# Patient Record
Sex: Female | Born: 1970 | Hispanic: No | Marital: Married | Smoking: Current every day smoker
Health system: Southern US, Community
[De-identification: ages and names within clinical notes are randomized; demographics above are authoritative.]

## PROBLEM LIST (undated history)

## (undated) DIAGNOSIS — N63 Unspecified lump in unspecified breast: Secondary | ICD-10-CM

## (undated) DIAGNOSIS — F419 Anxiety disorder, unspecified: Secondary | ICD-10-CM

## (undated) DIAGNOSIS — R058 Other specified cough: Secondary | ICD-10-CM

## (undated) DIAGNOSIS — F32A Depression, unspecified: Secondary | ICD-10-CM

## (undated) DIAGNOSIS — R05 Cough: Secondary | ICD-10-CM

## (undated) DIAGNOSIS — T8859XA Other complications of anesthesia, initial encounter: Secondary | ICD-10-CM

## (undated) DIAGNOSIS — G4763 Sleep related bruxism: Secondary | ICD-10-CM

## (undated) DIAGNOSIS — F329 Major depressive disorder, single episode, unspecified: Secondary | ICD-10-CM

## (undated) DIAGNOSIS — T4145XA Adverse effect of unspecified anesthetic, initial encounter: Secondary | ICD-10-CM

## (undated) HISTORY — DX: Unspecified lump in unspecified breast: N63.0

## (undated) HISTORY — PX: UMBILICAL HERNIA REPAIR: SHX196

## (undated) HISTORY — DX: Anxiety disorder, unspecified: F41.9

---

## 1997-09-28 HISTORY — PX: OTHER SURGICAL HISTORY: SHX169

## 1998-05-28 ENCOUNTER — Encounter: Admission: RE | Admit: 1998-05-28 | Discharge: 1998-05-28 | Payer: Self-pay | Admitting: *Deleted

## 2003-09-29 HISTORY — PX: CERVIX SURGERY: SHX593

## 2004-07-10 ENCOUNTER — Encounter: Admission: RE | Admit: 2004-07-10 | Discharge: 2004-07-10 | Payer: Self-pay | Admitting: Internal Medicine

## 2006-05-13 ENCOUNTER — Emergency Department (HOSPITAL_COMMUNITY): Admission: EM | Admit: 2006-05-13 | Discharge: 2006-05-13 | Payer: Self-pay | Admitting: Emergency Medicine

## 2010-01-09 ENCOUNTER — Encounter: Admission: RE | Admit: 2010-01-09 | Discharge: 2010-03-26 | Payer: Self-pay | Admitting: Emergency Medicine

## 2011-08-27 ENCOUNTER — Other Ambulatory Visit: Payer: Self-pay | Admitting: Obstetrics and Gynecology

## 2011-08-27 DIAGNOSIS — N6320 Unspecified lump in the left breast, unspecified quadrant: Secondary | ICD-10-CM

## 2011-09-10 ENCOUNTER — Ambulatory Visit
Admission: RE | Admit: 2011-09-10 | Discharge: 2011-09-10 | Disposition: A | Discharge status: Home / Self Care | Payer: BC Managed Care – PPO | Source: Ambulatory Visit | Attending: Obstetrics and Gynecology | Admitting: Obstetrics and Gynecology

## 2011-09-10 DIAGNOSIS — N6320 Unspecified lump in the left breast, unspecified quadrant: Secondary | ICD-10-CM

## 2011-10-26 ENCOUNTER — Ambulatory Visit (INDEPENDENT_AMBULATORY_CARE_PROVIDER_SITE_OTHER): Payer: BC Managed Care – PPO

## 2011-10-26 DIAGNOSIS — J040 Acute laryngitis: Secondary | ICD-10-CM

## 2011-10-26 DIAGNOSIS — J029 Acute pharyngitis, unspecified: Secondary | ICD-10-CM

## 2011-10-26 DIAGNOSIS — J209 Acute bronchitis, unspecified: Secondary | ICD-10-CM

## 2011-10-27 ENCOUNTER — Telehealth: Payer: Self-pay

## 2011-10-27 NOTE — Telephone Encounter (Signed)
.  [facility]  Pt saw dr Mindi Junker yesterday,told to call back if she wanted 2nd rx, please call into [facility]

## 2011-10-28 NOTE — Telephone Encounter (Signed)
LMOM notifying pt that Diflucan was called into [facility].

## 2012-02-01 ENCOUNTER — Ambulatory Visit (INDEPENDENT_AMBULATORY_CARE_PROVIDER_SITE_OTHER): Payer: BC Managed Care – PPO | Admitting: General Surgery

## 2012-02-01 ENCOUNTER — Encounter (INDEPENDENT_AMBULATORY_CARE_PROVIDER_SITE_OTHER): Payer: Self-pay | Admitting: General Surgery

## 2012-02-01 DIAGNOSIS — N63 Unspecified lump in unspecified breast: Secondary | ICD-10-CM

## 2012-02-01 NOTE — Progress Notes (Signed)
Patient ID: Amanda [name], female   DOB: [birth year], 41 y.o.   MRN: [id number]  Chief Complaint  Patient presents with  . Breast Mass    new pt- eval L breast mass    HPI Amanda [name] is a 41 y.o. female. Referred by Dr. Dareen Piano HPI  30 yof who has history of fibrocystic changes and difficult exam who presents with about a six month history of left uoq breast mass.  This underwent mmg/us in December which was normal.  The area got bigger after attempt at biopsy and has remained the same.  She is concerned this area is not going away.  She has no nipple d/c, no prior history of breast biopsies or abnl mmg.  She does have fh in mom who had breast cancer at age 5.    Past Medical History  Diagnosis Date  . Breast mass     left breast    Past Surgical History  Procedure Date  . Hernia repair 1984    umb hernia  . Vocal cord polyp excision 1992  . Cervix surgery     Family History  Problem Relation Age of Onset  . Cancer Mother     breast  . Cancer Maternal Uncle     brain  . Cancer Maternal Grandmother     leukemia    Social History History  Substance Use Topics  . Smoking status: Current Everyday Smoker  . Smokeless tobacco: Not on file   Comment: smokes 1 cigarette a day  . Alcohol Use: No    No Known Allergies  Current Outpatient Prescriptions  Medication Sig Dispense Refill  . FLUoxetine (PROZAC) 20 MG capsule       . Multiple Vitamin (MULTIVITAMINS PO) Take by mouth. Multivitamin + skin        Review of Systems Review of Systems  Constitutional: Negative for fever, chills and unexpected weight change.  HENT: Negative for hearing loss, congestion, sore throat, trouble swallowing and voice change.   Eyes: Negative for visual disturbance.  Respiratory: Negative for cough and wheezing.   Cardiovascular: Negative for chest pain, palpitations and leg swelling.  Gastrointestinal: Negative for nausea, vomiting, abdominal pain, diarrhea, constipation, blood in  stool, abdominal distention and anal bleeding.  Genitourinary: Negative for hematuria, vaginal bleeding and difficulty urinating.  Musculoskeletal: Negative for arthralgias.  Skin: Negative for rash and wound.  Neurological: Negative for seizures, syncope and headaches.  Hematological: Negative for adenopathy. Does not bruise/bleed easily.  Psychiatric/Behavioral: Negative for confusion.    There were no vitals taken for this visit.  Physical Exam Physical Exam  Vitals reviewed. Constitutional: She appears well-developed and well-nourished.  Neck: Neck supple.  Cardiovascular: Normal rate, regular rhythm and normal heart sounds.   Pulmonary/Chest: Effort normal and breath sounds normal. She has no wheezes. She has no rales. Right breast exhibits no inverted nipple, no mass, no nipple discharge, no skin change and no tenderness. Left breast exhibits mass. Left breast exhibits no inverted nipple, no nipple discharge, no skin change and no tenderness. Breasts are symmetrical.    Lymphadenopathy:    She has no cervical adenopathy.    Data Reviewed Clinical Data: Questioned palpable finding left upper outer  quadrant per patient and referring physician. Maternal history of  breast cancer age 32.  DIGITAL DIAGNOSTIC BILATERAL MAMMOGRAM WITH CAD AND LEFT BREAST  ULTRASOUND:  Comparison: The patient's prior examinations from [place] are not  available for comparison.  Findings: The breast parenchyma is extremely dense,  which may  limit the sensitivity of mammography. No suspicious mass,  calcification, or architectural distortion is seen.  Mammographic images were processed with CAD.  On physical exam, I palpate no abnormality in the left upper outer  quadrant. There is a ridge of dense tissue in this area.  Ultrasound is performed, showing dense fibroglandular tissue but no  measurable focal abnormality, distortion, or shadowing in the left  upper outer quadrant.  IMPRESSION:  No  evidence for malignancy in either breast. Any further  management of the questioned palpable finding in the left upper  outer quadrant should be dictated by clinical assessment.  Otherwise, screening mammography is recommended in 1 year. Findings  and recommendations discussed with the patient and provided in  written form at the time of the exam.   Assessment    Left breast mass    Plan    Both of [place] can easily palpate this mass.  I recommended excisional biopsy and we discussed that.  She is unable to do this before she takes a 2 month trip home so [place].  We will plan on doing this when she returns.  I told her we were doing it to rule out cancer.       Amanda [name] 02/01/2012, 9:46 AM

## 2012-04-28 DIAGNOSIS — N63 Unspecified lump in unspecified breast: Secondary | ICD-10-CM

## 2012-04-28 HISTORY — DX: Unspecified lump in unspecified breast: N63.0

## 2012-05-13 ENCOUNTER — Telehealth (INDEPENDENT_AMBULATORY_CARE_PROVIDER_SITE_OTHER): Payer: Self-pay

## 2012-05-13 NOTE — Telephone Encounter (Signed)
LMOM giving pt a f/u appt with Dr Dwain Sarna on 8/26 before her sx on 8/29. I asked for her to come in at 4:15.

## 2012-05-18 ENCOUNTER — Other Ambulatory Visit (INDEPENDENT_AMBULATORY_CARE_PROVIDER_SITE_OTHER): Payer: Self-pay | Admitting: General Surgery

## 2012-05-19 ENCOUNTER — Encounter (HOSPITAL_BASED_OUTPATIENT_CLINIC_OR_DEPARTMENT_OTHER): Payer: Self-pay | Admitting: *Deleted

## 2012-05-23 ENCOUNTER — Encounter (INDEPENDENT_AMBULATORY_CARE_PROVIDER_SITE_OTHER): Payer: Self-pay | Admitting: General Surgery

## 2012-05-23 ENCOUNTER — Ambulatory Visit (INDEPENDENT_AMBULATORY_CARE_PROVIDER_SITE_OTHER): Payer: BC Managed Care – PPO | Admitting: General Surgery

## 2012-05-23 DIAGNOSIS — N63 Unspecified lump in unspecified breast: Secondary | ICD-10-CM

## 2012-05-23 NOTE — Progress Notes (Signed)
Subjective:     Patient ID: Amanda [name], female   DOB: 1970-10-27, 41 y.o.   MRN: [id number]  HPI This is a 41 year old female I saw previously with a left upper-outer quadrant or left axillary mass. She was in obvious pain when I had seen her last. She returns now without any real changes associated with this.  Review of Systems     Objective:   Physical Exam  Pulmonary/Chest:         Assessment:     Left breast or axillary mass    Plan:     We discussed again today that this area needs to be excised. Discussed again the risks of this also., Plan on doing this on Thursday now she has returned.

## 2012-05-26 ENCOUNTER — Encounter (HOSPITAL_BASED_OUTPATIENT_CLINIC_OR_DEPARTMENT_OTHER)
Admission: RE | Disposition: A | Discharge status: Home / Self Care | Payer: Self-pay | Source: Ambulatory Visit | Attending: General Surgery

## 2012-05-26 ENCOUNTER — Encounter (HOSPITAL_BASED_OUTPATIENT_CLINIC_OR_DEPARTMENT_OTHER): Payer: Self-pay | Admitting: *Deleted

## 2012-05-26 ENCOUNTER — Ambulatory Visit (HOSPITAL_BASED_OUTPATIENT_CLINIC_OR_DEPARTMENT_OTHER): Payer: BC Managed Care – PPO | Admitting: Certified Registered"

## 2012-05-26 ENCOUNTER — Encounter (HOSPITAL_BASED_OUTPATIENT_CLINIC_OR_DEPARTMENT_OTHER): Payer: Self-pay | Admitting: Certified Registered"

## 2012-05-26 ENCOUNTER — Ambulatory Visit (HOSPITAL_BASED_OUTPATIENT_CLINIC_OR_DEPARTMENT_OTHER)
Admission: RE | Admit: 2012-05-26 | Discharge: 2012-05-26 | Disposition: A | Discharge status: Home / Self Care | Payer: BC Managed Care – PPO | Source: Ambulatory Visit | Attending: General Surgery | Admitting: General Surgery

## 2012-05-26 ENCOUNTER — Telehealth (INDEPENDENT_AMBULATORY_CARE_PROVIDER_SITE_OTHER): Payer: Self-pay

## 2012-05-26 DIAGNOSIS — D249 Benign neoplasm of unspecified breast: Secondary | ICD-10-CM | POA: Insufficient documentation

## 2012-05-26 HISTORY — DX: Other specified cough: R05.8

## 2012-05-26 HISTORY — DX: Cough: R05

## 2012-05-26 HISTORY — DX: Major depressive disorder, single episode, unspecified: F32.9

## 2012-05-26 HISTORY — DX: Sleep related bruxism: G47.63

## 2012-05-26 HISTORY — PX: BREAST MASS EXCISION: SHX1267

## 2012-05-26 HISTORY — DX: Depression, unspecified: F32.A

## 2012-05-26 HISTORY — DX: Other complications of anesthesia, initial encounter: T88.59XA

## 2012-05-26 HISTORY — DX: Adverse effect of unspecified anesthetic, initial encounter: T41.45XA

## 2012-05-26 SURGERY — EXCISION OF BREAST BIOPSY
Anesthesia: General | Site: Breast | Laterality: Left | Wound class: Clean

## 2012-05-26 MED ORDER — OXYCODONE HCL 5 MG PO TABS: 5.0000 mg | ORAL_TABLET | Freq: Once | ORAL | Status: DC | PRN

## 2012-05-26 MED ORDER — METOCLOPRAMIDE HCL 5 MG/ML IJ SOLN: 10.0000 mg | Freq: Once | INTRAMUSCULAR | Status: DC | PRN

## 2012-05-26 MED ORDER — ONDANSETRON HCL 4 MG/2ML IJ SOLN
INTRAMUSCULAR | Status: DC | PRN
  Administered 2012-05-26: 4 mg via INTRAVENOUS

## 2012-05-26 MED ORDER — HYDROMORPHONE HCL PF 1 MG/ML IJ SOLN: 0.2500 mg | INTRAMUSCULAR | Status: DC | PRN

## 2012-05-26 MED ORDER — LACTATED RINGERS IV SOLN
INTRAVENOUS | Status: DC
  Administered 2012-05-26: 07:00:00 via INTRAVENOUS

## 2012-05-26 MED ORDER — FENTANYL CITRATE 0.05 MG/ML IJ SOLN: 50.0000 ug | INTRAMUSCULAR | Status: DC | PRN

## 2012-05-26 MED ORDER — FENTANYL CITRATE 0.05 MG/ML IJ SOLN
INTRAMUSCULAR | Status: DC | PRN
  Administered 2012-05-26: 50 ug via INTRAVENOUS

## 2012-05-26 MED ORDER — OXYCODONE-ACETAMINOPHEN 5-325 MG PO TABS: 1.0000 | ORAL_TABLET | ORAL | Status: DC | PRN

## 2012-05-26 MED ORDER — LIDOCAINE HCL (CARDIAC) 20 MG/ML IV SOLN
INTRAVENOUS | Status: DC | PRN
  Administered 2012-05-26: 40 mg via INTRAVENOUS

## 2012-05-26 MED ORDER — ACETAMINOPHEN 10 MG/ML IV SOLN
1000.0000 mg | Freq: Once | INTRAVENOUS | Status: AC
  Administered 2012-05-26: 1000 mg via INTRAVENOUS

## 2012-05-26 MED ORDER — OXYCODONE HCL 5 MG/5ML PO SOLN: 5.0000 mg | Freq: Once | ORAL | Status: DC | PRN

## 2012-05-26 MED ORDER — SCOPOLAMINE 1 MG/3DAYS TD PT72
1.0000 | MEDICATED_PATCH | Freq: Once | TRANSDERMAL | Status: DC
  Administered 2012-05-26: 1.5 mg via TRANSDERMAL

## 2012-05-26 MED ORDER — DEXAMETHASONE SODIUM PHOSPHATE 4 MG/ML IJ SOLN
INTRAMUSCULAR | Status: DC | PRN
  Administered 2012-05-26: 10 mg via INTRAVENOUS

## 2012-05-26 MED ORDER — PROPOFOL 10 MG/ML IV BOLUS
INTRAVENOUS | Status: DC | PRN
  Administered 2012-05-26: 40 mg via INTRAVENOUS
  Administered 2012-05-26: 200 mg via INTRAVENOUS

## 2012-05-26 MED ORDER — BUPIVACAINE HCL (PF) 0.25 % IJ SOLN
INTRAMUSCULAR | Status: DC | PRN
  Administered 2012-05-26: 10 mL

## 2012-05-26 MED ORDER — MIDAZOLAM HCL 5 MG/5ML IJ SOLN
INTRAMUSCULAR | Status: DC | PRN
  Administered 2012-05-26: 1 mg via INTRAVENOUS

## 2012-05-26 SURGICAL SUPPLY — 55 items
ADH SKN CLS APL DERMABOND .7 (GAUZE/BANDAGES/DRESSINGS) ×1
APL SKNCLS STERI-STRIP NONHPOA (GAUZE/BANDAGES/DRESSINGS)
APPLIER CLIP 9.375 MED OPEN (MISCELLANEOUS)
APR CLP MED 9.3 20 MLT OPN (MISCELLANEOUS)
BENZOIN TINCTURE PRP APPL 2/3 (GAUZE/BANDAGES/DRESSINGS) ×1 IMPLANT
BINDER BREAST LRG (GAUZE/BANDAGES/DRESSINGS) IMPLANT
BINDER BREAST MEDIUM (GAUZE/BANDAGES/DRESSINGS) IMPLANT
BINDER BREAST XLRG (GAUZE/BANDAGES/DRESSINGS) IMPLANT
BINDER BREAST XXLRG (GAUZE/BANDAGES/DRESSINGS) IMPLANT
BLADE SURG 15 STRL LF DISP TIS (BLADE) ×1 IMPLANT
BLADE SURG 15 STRL SS (BLADE) ×2
CANISTER SUCTION 1200CC (MISCELLANEOUS) IMPLANT
CHLORAPREP W/TINT 26ML (MISCELLANEOUS) ×2 IMPLANT
CLIP APPLIE 9.375 MED OPEN (MISCELLANEOUS) IMPLANT
CLOTH BEACON ORANGE TIMEOUT ST (SAFETY) ×2 IMPLANT
COVER MAYO STAND STRL (DRAPES) ×2 IMPLANT
COVER TABLE BACK 60X90 (DRAPES) ×2 IMPLANT
DECANTER SPIKE VIAL GLASS SM (MISCELLANEOUS) IMPLANT
DERMABOND ADVANCED (GAUZE/BANDAGES/DRESSINGS) ×1
DERMABOND ADVANCED .7 DNX12 (GAUZE/BANDAGES/DRESSINGS) IMPLANT
DEVICE DUBIN W/COMP PLATE 8390 (MISCELLANEOUS) IMPLANT
DRAPE PED LAPAROTOMY (DRAPES) ×2 IMPLANT
DRSG TEGADERM 4X4.75 (GAUZE/BANDAGES/DRESSINGS) ×2 IMPLANT
ELECT COATED BLADE 2.86 ST (ELECTRODE) ×2 IMPLANT
ELECT REM PT RETURN 9FT ADLT (ELECTROSURGICAL) ×2
ELECTRODE REM PT RTRN 9FT ADLT (ELECTROSURGICAL) ×1 IMPLANT
GAUZE SPONGE 4X4 12PLY STRL LF (GAUZE/BANDAGES/DRESSINGS) ×2 IMPLANT
GLOVE BIO SURGEON STRL SZ 6.5 (GLOVE) ×1 IMPLANT
GLOVE BIO SURGEON STRL SZ7 (GLOVE) ×2 IMPLANT
GLOVE BIOGEL PI IND STRL 7.5 (GLOVE) ×1 IMPLANT
GLOVE BIOGEL PI INDICATOR 7.5 (GLOVE) ×1
GOWN PREVENTION PLUS XLARGE (GOWN DISPOSABLE) ×3 IMPLANT
NDL HYPO 25X1 1.5 SAFETY (NEEDLE) ×1 IMPLANT
NEEDLE HYPO 25X1 1.5 SAFETY (NEEDLE) ×2 IMPLANT
NS IRRIG 1000ML POUR BTL (IV SOLUTION) ×1 IMPLANT
PACK BASIN DAY SURGERY FS (CUSTOM PROCEDURE TRAY) ×2 IMPLANT
PENCIL BUTTON HOLSTER BLD 10FT (ELECTRODE) ×2 IMPLANT
SLEEVE SCD COMPRESS KNEE MED (MISCELLANEOUS) ×2 IMPLANT
SPONGE LAP 4X18 X RAY DECT (DISPOSABLE) ×2 IMPLANT
STRIP CLOSURE SKIN 1/2X4 (GAUZE/BANDAGES/DRESSINGS) ×2 IMPLANT
SUT MNCRL AB 4-0 PS2 18 (SUTURE) ×1 IMPLANT
SUT MON AB 5-0 PS2 18 (SUTURE) ×1 IMPLANT
SUT SILK 2 0 SH (SUTURE) ×2 IMPLANT
SUT VIC AB 2-0 SH 27 (SUTURE) ×2
SUT VIC AB 2-0 SH 27XBRD (SUTURE) ×1 IMPLANT
SUT VIC AB 3-0 SH 27 (SUTURE) ×2
SUT VIC AB 3-0 SH 27X BRD (SUTURE) ×1 IMPLANT
SUT VIC AB 5-0 PS2 18 (SUTURE) ×1 IMPLANT
SUT VICRYL AB 3 0 TIES (SUTURE) IMPLANT
SYR CONTROL 10ML LL (SYRINGE) ×2 IMPLANT
TOWEL OR 17X24 6PK STRL BLUE (TOWEL DISPOSABLE) ×2 IMPLANT
TOWEL OR NON WOVEN STRL DISP B (DISPOSABLE) ×2 IMPLANT
TUBE CONNECTING 20X1/4 (TUBING) IMPLANT
WATER STERILE IRR 1000ML POUR (IV SOLUTION) ×2 IMPLANT
YANKAUER SUCT BULB TIP NO VENT (SUCTIONS) IMPLANT

## 2012-05-26 NOTE — H&P (View-Only) (Signed)
Subjective:     Patient ID: Amanda [name], female   DOB: 05/13/1971, 40 y.o.   MRN: [id number]  HPI This is a 40-year-old female I saw previously with a left upper-outer quadrant or left axillary mass. She was in obvious pain when I had seen her last. She returns now without any real changes associated with this.  Review of Systems     Objective:   Physical Exam  Pulmonary/Chest:         Assessment:     Left breast or axillary mass    Plan:     We discussed again today that this area needs to be excised. Discussed again the risks of this also., Plan on doing this on Thursday now she has returned.      

## 2012-05-26 NOTE — Transfer of Care (Signed)
Immediate Anesthesia Transfer of Care Note  Patient: Amanda [name]  Procedure(s) Performed: Procedure(s) (LRB): EXCISION OF BREAST BIOPSY (Left)  Patient Location: PACU  Anesthesia Type: General  Level of Consciousness: awake, alert , oriented and patient cooperative  Airway & Oxygen Therapy: Patient Spontanous Breathing and Patient connected to face mask oxygen  Post-op Assessment: Report given to PACU RN and Post -op Vital signs reviewed and stable  Post vital signs: Reviewed and stable  Complications: No apparent anesthesia complications

## 2012-05-26 NOTE — Op Note (Signed)
Preoperative diagnosis: Left upper outer quadrant breast mass. Postoperative diagnoses: Same as above Procedure: Left breast mass excisional biopsy Surgeon: Dr. Harden Mo Anesthesia: Gen. With LMA Specimens: Left breast mass Estimated blood loss: Minimal Complications: None Drains: None Sponge and needle count correct x2 at end of operation Disposition to recovery stable  Indications: This a 41 year old female with a left breast mass that was noted on both before exams. 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 She and I discussed excising this.  Procedure: After informed consent was obtained the patient was taken to the operating room. She had sequential compression devices placed on her legs. She was then placed under general anesthesia without complication. Her left breast and axilla were prepped and draped in the standard sterile surgical fashion.  Surgical timeout was performed. I made an incision over more towards her axilla in the crease of her breast laterally. This was done in an effort to hide this incision for the future. I then tunneled to the lesion and excised it in total. 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 I then obtained hemostasis. I did not mark the orientation as this was lost as I excised it did with mobility. I then closed this with 3-0 Vicryl, 5-0 Monocryl, and Dermabond, and Steri-Strips. A dressing was placed over this. I infiltrated this entire area of quarter percent Marcaine. She tolerated this well end was transferred to recovery stable.

## 2012-05-26 NOTE — Anesthesia Postprocedure Evaluation (Signed)
Anesthesia Post Note  Patient: Amanda [name]  Procedure(s) Performed: Procedure(s) (LRB): EXCISION OF BREAST BIOPSY (Left)  Anesthesia type: General  Patient location: PACU  Post pain: Pain level controlled  Post assessment: Patient's Cardiovascular Status Stable  Last Vitals:  Filed Vitals:   05/26/12 0915  BP: 93/67  Pulse:   Temp:   Resp:     Post vital signs: Reviewed and stable  Level of consciousness: alert  Complications: No apparent anesthesia complications

## 2012-05-26 NOTE — Interval H&P Note (Signed)
History and Physical Interval Note:  05/26/2012 7:24 AM  Amanda [name]  has presented today for surgery, with the diagnosis of remove left breast lump  The various methods of treatment have been discussed with the patient and family. After consideration of risks, benefits and other options for treatment, the patient has consented to  Procedure(s) (LRB): EXCISION OF BREAST BIOPSY (Left) as a surgical intervention .  The patient's history has been reviewed, patient examined, no change in status, stable for surgery.  I have reviewed the patient's chart and labs.  Questions were answered to the patient's satisfaction.     Zyann Mabry

## 2012-05-26 NOTE — Anesthesia Procedure Notes (Signed)
Procedure Name: LMA Insertion Date/Time: 05/26/2012 7:40 AM Performed by: Verlan Friends Pre-anesthesia Checklist: Patient identified, Emergency Drugs available, Suction available, Patient being monitored and Timeout performed Patient Re-evaluated:Patient Re-evaluated prior to inductionOxygen Delivery Method: Circle System Utilized Preoxygenation: Pre-oxygenation with 100% oxygen Intubation Type: IV induction Ventilation: Mask ventilation without difficulty LMA: LMA inserted LMA Size: 4.0 Number of attempts: 1 Airway Equipment and Method: bite block Placement Confirmation: positive ETCO2 Tube secured with: Tape Dental Injury: Teeth and Oropharynx as per pre-operative assessment

## 2012-05-26 NOTE — Anesthesia Preprocedure Evaluation (Signed)
Anesthesia Evaluation  Patient identified by MRN, date of birth, ID band Patient awake    Reviewed: Allergy & Precautions, H&P , NPO status , Patient's Chart, lab work & pertinent test results, reviewed documented beta blocker date and time   History of Anesthesia Complications (+) PROLONGED EMERGENCE  Airway Mallampati: II TM Distance: >3 FB Neck ROM: full    Dental   Pulmonary Current Smoker,  breath sounds clear to auscultation        Cardiovascular negative cardio ROS  Rhythm:regular     Neuro/Psych PSYCHIATRIC DISORDERS negative neurological ROS     GI/Hepatic negative GI ROS, Neg liver ROS,   Endo/Other  negative endocrine ROS  Renal/GU negative Renal ROS  negative genitourinary   Musculoskeletal   Abdominal   Peds  Hematology negative hematology ROS (+)   Anesthesia Other Findings See surgeon's H&P   Reproductive/Obstetrics negative OB ROS                           Anesthesia Physical Anesthesia Plan  ASA: II  Anesthesia Plan: General   Post-op Pain Management:    Induction: Intravenous  Airway Management Planned: LMA  Additional Equipment:   Intra-op Plan:   Post-operative Plan: Extubation in OR  Informed Consent: I have reviewed the patients History and Physical, chart, labs and discussed the procedure including the risks, benefits and alternatives for the proposed anesthesia with the patient or authorized representative who has indicated his/her understanding and acceptance.   Dental Advisory Given  Plan Discussed with: CRNA and Surgeon  Anesthesia Plan Comments:         Anesthesia Quick Evaluation

## 2012-05-26 NOTE — Telephone Encounter (Signed)
LMOM w/pt's appt date and time with Dr Dwain Sarna.

## 2012-05-27 ENCOUNTER — Telehealth (INDEPENDENT_AMBULATORY_CARE_PROVIDER_SITE_OTHER): Payer: Self-pay | Admitting: General Surgery

## 2012-05-27 NOTE — Telephone Encounter (Signed)
Message copied by Littie Deeds on Fri May 27, 2012  2:47 PM ------      Message from: [place], [place]      Created: Fri May 27, 2012 12:31 PM       Would you call her and tell her this is benign fibroadenoma      ----- Message -----         From: [facility]         Sent: 05/27/2012  12:09 PM           To: Emelia Loron, MD

## 2012-05-27 NOTE — Telephone Encounter (Signed)
Spoke with pt and informed her that her surgical pathology came back as a benign fibroadenoma.  She was pleased with this.

## 2012-05-30 ENCOUNTER — Telehealth (INDEPENDENT_AMBULATORY_CARE_PROVIDER_SITE_OTHER): Payer: Self-pay | Admitting: Surgery

## 2012-05-30 NOTE — Telephone Encounter (Signed)
Patient status post breast lumpectomy last week by Dr. Dwain Sarna.  Noticed some itching with oxycodone and stopped on postop day one.  The next day he noticed a rash on neck and breasts.  It has progressively worsened.  She denies taking any other medications at this time.  She's been showering several times a day.  No changes in soap or laundry or other changes.  She called [place] yesterday.  It was recommended that she try topical steroid cream.  She's been doing that and has not gotten better.  She worries it may be a little worse.   She calls today, postoperative day #4.  She's concerned.  She is breathing okay.  Feels mildly tired but otherwise all right.  No particular tenderness at the incision.  No redness or drainage.  No fevers or chills or sweats.  No swelling on her face or extremities.  No lightheaded or dizziness.  I recommend she add oral Benadryl.  I recommend she continue using the hydrocortisone steroid cream over the rash at least twice a day.  Had a antifungal cream such as over-the-counter miconazole.     If that gets better, continue the plan & see [place] soon    If not better, consider talking to her primary care physician to see if a more aggressive workup needs to be done.  Consider if they feel a steroid taper needs to be done.  If it gets much worse, go to the emergency room as all offices are closed because today is Labor Day.  She expressed understanding and appreciation.

## 2012-05-31 ENCOUNTER — Other Ambulatory Visit (INDEPENDENT_AMBULATORY_CARE_PROVIDER_SITE_OTHER): Payer: Self-pay | Admitting: General Surgery

## 2012-05-31 ENCOUNTER — Telehealth (INDEPENDENT_AMBULATORY_CARE_PROVIDER_SITE_OTHER): Payer: Self-pay

## 2012-05-31 DIAGNOSIS — Z09 Encounter for follow-up examination after completed treatment for conditions other than malignant neoplasm: Secondary | ICD-10-CM

## 2012-05-31 MED ORDER — METHYLPREDNISOLONE (PAK) 4 MG PO TABS: 4.0000 mg | ORAL_TABLET | Freq: Every day | ORAL | Status: DC

## 2012-05-31 NOTE — Telephone Encounter (Signed)
Pt calling b/c she has broke out into a rash on her left side,left breast,left arm,and left side of neck after surgery. I explained that it sounds like a reaction from the stuff to clean her skin with at the time of surgery. The pt has been taking otc Bendadryl along with cotisone cream but nothing is helping and it seems to be getting worse. The pt wants to know what to do b/c she said if she didn't get relief she was going to have to go to a Urgent Care today. Pls advise.

## 2012-05-31 NOTE — Telephone Encounter (Signed)
If it is getting worse other option would be to do prednisone dose pack. She can shower and try to get off also.  I would take benadryl scheduled though.

## 2012-05-31 NOTE — Telephone Encounter (Signed)
Called pt back to let her know what Dr Dwain Sarna recommended about the Prednisone dose pack if the pt wishes to try something else instead of the Benadryl. The pt wishes to try the prednisone dose pack. The pt's pharmacy is [facility]

## 2012-06-02 ENCOUNTER — Telehealth (INDEPENDENT_AMBULATORY_CARE_PROVIDER_SITE_OTHER): Payer: Self-pay

## 2012-06-02 NOTE — Telephone Encounter (Signed)
I called the pt and told her and she wants it in writing when she comes in.

## 2012-06-02 NOTE — Telephone Encounter (Signed)
The pt said she had a bad allergic reaction after surgery and had a rash on her neck and chest.  When she comes in for her follow up she would like a list of everything that was used on her skin during surgery so she can figure out what it was.

## 2012-06-02 NOTE — Telephone Encounter (Signed)
It was the chloraprep.  Please call and tell her this.  I will add to her allergies

## 2012-06-13 ENCOUNTER — Encounter (INDEPENDENT_AMBULATORY_CARE_PROVIDER_SITE_OTHER): Payer: Self-pay | Admitting: General Surgery

## 2012-06-13 ENCOUNTER — Ambulatory Visit (INDEPENDENT_AMBULATORY_CARE_PROVIDER_SITE_OTHER): Payer: BC Managed Care – PPO | Admitting: General Surgery

## 2012-06-13 VITALS — BP 104/62 | HR 76 | Temp 97.6°F | Resp 18 | Ht 63.0 in | Wt 124.0 lb

## 2012-06-13 DIAGNOSIS — Z09 Encounter for follow-up examination after completed treatment for conditions other than malignant neoplasm: Secondary | ICD-10-CM

## 2012-06-13 NOTE — Patient Instructions (Signed)

## 2012-06-13 NOTE — Progress Notes (Signed)
Subjective:     Patient ID: Amanda [name], female   DOB: 30-Oct-1970, 41 y.o.   MRN: [id number]  HPI This is a 41 year old female with the upper outer quadrant left breast mass. This underwent an excisional biopsy recently. She sounds like she had a fairly significant rash and itching due to the poor prep. This is resolved after I wrote her for a prednisone dosepak. I've added this to her allergies. Her pathology returns as a fibroadenoma. She is otherwise doing well.  Review of Systems     Objective:   Physical Exam Left breast incision healing well without infection    Assessment:     Status post excision of left breast fibroadenoma    Plan:     She returned to normal activity. I will see her back as needed. We discussed at extreme including monthly self exams, yearly clinical exams, and annual mammography. She also asked again about any plastic surgery. I told her that there is no reason from a breast cancer standpoint but she cannot undergo any procedures.

## 2012-07-17 ENCOUNTER — Ambulatory Visit (INDEPENDENT_AMBULATORY_CARE_PROVIDER_SITE_OTHER): Payer: BC Managed Care – PPO | Admitting: Emergency Medicine

## 2012-07-17 VITALS — BP 93/66 | HR 69 | Temp 98.0°F | Resp 16 | Ht 64.0 in | Wt 125.0 lb

## 2012-07-17 DIAGNOSIS — R350 Frequency of micturition: Secondary | ICD-10-CM

## 2012-07-17 DIAGNOSIS — R300 Dysuria: Secondary | ICD-10-CM

## 2012-07-17 DIAGNOSIS — N390 Urinary tract infection, site not specified: Secondary | ICD-10-CM

## 2012-07-17 DIAGNOSIS — N3000 Acute cystitis without hematuria: Secondary | ICD-10-CM

## 2012-07-17 LAB — POCT UA - MICROSCOPIC ONLY
Casts, Ur, LPF, POC: NEGATIVE
Crystals, Ur, HPF, POC: NEGATIVE
Mucus, UA: NEGATIVE
Yeast, UA: NEGATIVE

## 2012-07-17 LAB — POCT URINALYSIS DIPSTICK
Bilirubin, UA: NEGATIVE
Glucose, UA: NEGATIVE
Ketones, UA: NEGATIVE
Nitrite, UA: NEGATIVE
Protein, UA: 30
Spec Grav, UA: 1.025
Urobilinogen, UA: 0.2
pH, UA: 5.5

## 2012-07-17 MED ORDER — SULFAMETHOXAZOLE-TRIMETHOPRIM 800-160 MG PO TABS: 1.0000 | ORAL_TABLET | Freq: Two times a day (BID) | ORAL | Status: DC

## 2012-07-17 MED ORDER — FLUCONAZOLE 150 MG PO TABS: 150.0000 mg | ORAL_TABLET | Freq: Once | ORAL | Status: DC

## 2012-07-17 MED ORDER — PHENAZOPYRIDINE HCL 200 MG PO TABS: 200.0000 mg | ORAL_TABLET | Freq: Three times a day (TID) | ORAL | Status: AC | PRN

## 2012-07-17 MED ORDER — PHENAZOPYRIDINE HCL 200 MG PO TABS: 200.0000 mg | ORAL_TABLET | Freq: Three times a day (TID) | ORAL | Status: DC | PRN

## 2012-07-17 NOTE — Progress Notes (Signed)
[facility] [address], [place] [address] [phone]  Date:  07/17/2012   Name:  Amanda [name]   DOB:  [birth year]   MRN:  [id number]  PCP:  Cala Bradford, MD    Chief Complaint: Urinary Frequency   History of Present Illness:  Amanda [name] is a 41 y.o. very pleasant female patient who presents with the following:  Awoke this morning with pain in lower abdomen, dysuria, urgency and frequency.  No GI or GYN symptoms.  Denies fever or chills. Has a history of frequent urinary tract infections.  Third infection since August  Patient Active Problem List  Diagnosis  (none) - all problems resolved or deleted    Past Medical History  Diagnosis Date  . Breast mass 04/2012    left breast  . Depression   . Complication of anesthesia     states was hard to wake up post-op  . Dry cough     since vocal cord surgery  . Sleep related teeth grinding     wears mouth guard at night    Past Surgical History  Procedure Date  . Vocal cord polyp excision 1999  . Cervix surgery 2005    papilloma  . Hernia repair as a child    UHR  . Breast mass excision 05/26/2012    left    History  Substance Use Topics  . Smoking status: Current Every Day Smoker -- 25 years    Types: Cigarettes  . Smokeless tobacco: Never Used   Comment: smokes 3 cig./day  . Alcohol Use: Yes     occasional wine    Family History  Problem Relation Age of Onset  . Cancer Mother     breast  . Cancer Maternal Uncle     brain  . Cancer Maternal Grandmother     leukemia    Allergies  Allergen Reactions  . Chloraprep One Step (Chlorhexidine Gluconate)   . Oxycodone Rash    Rash all over     Medication list has been reviewed and updated.  Current Outpatient Prescriptions on File Prior to Visit  Medication Sig Dispense Refill  . ciprofloxacin (CIPRO) 250 MG tablet       . fluconazole (DIFLUCAN) 150 MG tablet         Review of Systems:  As per HPI, otherwise  negative.    Physical Examination: Filed Vitals:   07/17/12 1707  BP: 93/66  Pulse: 69  Temp: 98 F (36.7 C)  Resp: 16   Filed Vitals:   07/17/12 1707  Height: 5\' 4"  (1.626 m)  Weight: 125 lb (56.7 kg)   Body mass index is 21.46 kg/(m^2). Ideal Body Weight: Weight in (lb) to have BMI = 25: 145.3   GEN: WDWN, NAD, Non-toxic, A & O x 3 HEENT: Atraumatic, Normocephalic. Neck supple. No masses, No LAD. Ears and Nose: No external deformity. CV: RRR, No M/G/R. No JVD. No thrill. No extra heart sounds. PULM: CTA B, no wheezes, crackles, rhonchi. No retractions. No resp. distress. No accessory muscle use. ABD: S, NT, ND, +BS. No rebound. No HSM. EXTR: No c/c/e NEURO Normal gait.  PSYCH: Normally interactive. Conversant. Not depressed or anxious appearing.  Calm demeanor.    Assessment and Plan: Cystitis Septra ds Pyridium   Carmelina Dane, MD   Results for orders placed in visit on 07/17/12  POCT URINALYSIS DIPSTICK      Component Value Range   Color, UA yellow  Clarity, UA turbid     Glucose, UA neg     Bilirubin, UA neg     Ketones, UA neg     Spec Grav, UA 1.025     Blood, UA large     pH, UA 5.5     Protein, UA 30     Urobilinogen, UA 0.2     Nitrite, UA neg     Leukocytes, UA large (3+)    POCT UA - MICROSCOPIC ONLY      Component Value Range   WBC, Ur, HPF, POC TNTC     RBC, urine, microscopic 20-25     Bacteria, U Microscopic trace/cocci     Mucus, UA neg     Epithelial cells, urine per micros 0-1     Crystals, Ur, HPF, POC neg     Casts, Ur, LPF, POC neg     Yeast, UA neg    I have reviewed and agree with documentation. Was ref Rogelia Boga P. Merla Riches, M.D.

## 2012-09-28 HISTORY — PX: AUGMENTATION MAMMAPLASTY: SUR837

## 2012-10-18 ENCOUNTER — Other Ambulatory Visit: Payer: Self-pay | Admitting: Obstetrics and Gynecology

## 2012-10-18 DIAGNOSIS — Z1231 Encounter for screening mammogram for malignant neoplasm of breast: Secondary | ICD-10-CM

## 2012-11-24 ENCOUNTER — Ambulatory Visit
Admission: RE | Admit: 2012-11-24 | Discharge: 2012-11-24 | Disposition: A | Discharge status: Home / Self Care | Payer: BC Managed Care – PPO | Source: Ambulatory Visit | Attending: Obstetrics and Gynecology | Admitting: Obstetrics and Gynecology

## 2012-11-24 DIAGNOSIS — Z1231 Encounter for screening mammogram for malignant neoplasm of breast: Secondary | ICD-10-CM

## 2012-11-28 ENCOUNTER — Other Ambulatory Visit: Payer: Self-pay | Admitting: Obstetrics and Gynecology

## 2012-11-28 DIAGNOSIS — R928 Other abnormal and inconclusive findings on diagnostic imaging of breast: Secondary | ICD-10-CM

## 2012-12-08 ENCOUNTER — Ambulatory Visit
Admission: RE | Admit: 2012-12-08 | Discharge: 2012-12-08 | Disposition: A | Discharge status: Home / Self Care | Payer: BC Managed Care – PPO | Source: Ambulatory Visit | Attending: Obstetrics and Gynecology | Admitting: Obstetrics and Gynecology

## 2012-12-08 DIAGNOSIS — R928 Other abnormal and inconclusive findings on diagnostic imaging of breast: Secondary | ICD-10-CM

## 2013-06-08 ENCOUNTER — Other Ambulatory Visit: Payer: Self-pay | Admitting: Obstetrics and Gynecology

## 2013-08-08 ENCOUNTER — Other Ambulatory Visit: Payer: Self-pay | Admitting: Family Medicine

## 2013-08-08 ENCOUNTER — Ambulatory Visit
Admission: RE | Admit: 2013-08-08 | Discharge: 2013-08-08 | Disposition: A | Discharge status: Home / Self Care | Payer: BC Managed Care – PPO | Source: Ambulatory Visit | Attending: Family Medicine | Admitting: Family Medicine

## 2013-08-08 DIAGNOSIS — M545 Low back pain, unspecified: Secondary | ICD-10-CM

## 2014-06-12 ENCOUNTER — Other Ambulatory Visit: Payer: Self-pay | Admitting: Obstetrics and Gynecology

## 2014-06-13 LAB — CYTOLOGY - PAP

## 2014-09-04 ENCOUNTER — Other Ambulatory Visit: Payer: Self-pay

## 2014-09-04 ENCOUNTER — Ambulatory Visit
Admission: RE | Admit: 2014-09-04 | Discharge: 2014-09-04 | Disposition: A | Discharge status: Home / Self Care | Payer: BC Managed Care – PPO | Source: Ambulatory Visit

## 2014-09-04 DIAGNOSIS — Z9882 Breast implant status: Secondary | ICD-10-CM

## 2014-09-04 DIAGNOSIS — Z1231 Encounter for screening mammogram for malignant neoplasm of breast: Secondary | ICD-10-CM

## 2014-10-03 ENCOUNTER — Encounter: Payer: Self-pay | Admitting: Family Medicine

## 2014-10-03 ENCOUNTER — Ambulatory Visit (INDEPENDENT_AMBULATORY_CARE_PROVIDER_SITE_OTHER): Payer: BC Managed Care – PPO | Admitting: Family Medicine

## 2014-10-03 VITALS — BP 102/62 | HR 69 | Temp 97.8°F | Resp 16 | Ht 65.25 in | Wt 137.0 lb

## 2014-10-03 DIAGNOSIS — B001 Herpesviral vesicular dermatitis: Secondary | ICD-10-CM

## 2014-10-03 MED ORDER — VALACYCLOVIR HCL 500 MG PO TABS: 500.0000 mg | ORAL_TABLET | Freq: Two times a day (BID) | ORAL | Status: DC

## 2014-10-03 MED ORDER — HYDROCODONE-ACETAMINOPHEN 5-325 MG PO TABS: 1.0000 | ORAL_TABLET | Freq: Four times a day (QID) | ORAL | Status: DC | PRN

## 2014-10-03 NOTE — Patient Instructions (Signed)
Cold Sore  A cold sore (fever blister) is a skin infection caused by the herpes simplex virus (HSV-1). HSV-1 is closely related to the virus that causes genital herpes (HSV-2), but they are not the same even though both viruses can cause oral and genital infections. Cold sores are small, fluid-filled sores inside of the mouth or on the lips, gums, nose, chin, cheeks, or fingers.   The herpes simplex virus can be easily passed (contagious) to other people through close personal contact, such as kissing or sharing personal items. The virus can also spread to other parts of the body, such as the eyes or genitals. Cold sores are contagious until the sores crust over completely. They often heal within 2 weeks.   Once a person is infected, the herpes simplex virus remains permanently in the body. Therefore, there is no cure for cold sores, and they often recur when a person is tired, stressed, sick, or gets too much sun. Additional factors that can cause a recurrence include hormone changes in menstruation or pregnancy, certain drugs, and cold weather.   CAUSES   Cold sores are caused by the herpes simplex virus. The virus is spread from person to person through close contact, such as through kissing, touching the affected area, or sharing personal items such as lip balm, razors, or eating utensils.   SYMPTOMS   The first infection may not cause symptoms. If symptoms develop, the symptoms often go through different stages. Here is how a cold sore develops:   · Tingling, itching, or burning is felt 1-2 days before the outbreak.    · Fluid-filled blisters appear on the lips, inside the mouth, nose, or on the cheeks.    · The blisters start to ooze clear fluid.    · The blisters dry up and a yellow crust appears in its place.    · The crust falls off.    Symptoms depend on whether it is the initial outbreak or a recurrence. Some other symptoms with the first outbreak may include:   · Fever.    · Sore throat.    · Headache.     · Muscle aches.    · Swollen neck glands.    DIAGNOSIS   A diagnosis is often made based on your symptoms and looking at the sores. Sometimes, a sore may be swabbed and then examined in the lab to make a final diagnosis. If the sores are not present, blood tests can find the herpes simplex virus.   TREATMENT   There is no cure for cold sores and no vaccine for the herpes simplex virus. Within 2 weeks, most cold sores go away on their own without treatment. Medicines cannot make the infection go away, but medicine can help relieve some of the pain associated with the sores, can work to stop the virus from multiplying, and can also shorten healing time. Medicine may be in the form of creams, gels, pills, or a shot.   HOME CARE INSTRUCTIONS   · Only take over-the-counter or prescription medicines for pain, discomfort, or fever as directed by your caregiver. Do not use aspirin.    · Use a cotton-tip swab to apply creams or gels to your sores.    · Do not touch the sores or pick the scabs. Wash your hands often. Do not touch your eyes without washing your hands first.    · Avoid kissing, oral sex, and sharing personal items until sores heal.    · Apply an ice pack on your sores for 10-15 minutes to ease any discomfort.    ·   Avoid hot, cold, or salty foods because they may hurt your mouth. Eat a soft, bland diet to avoid irritating the sores. Use a straw to drink if you have pain when drinking out of a glass.    · Keep sores clean and dry to prevent an infection of other tissues.    · Avoid the sun and limit stress if these things trigger outbreaks. If sun causes cold sores, apply sunscreen on the lips before being out in the sun.    SEEK MEDICAL CARE IF:   · You have a fever or persistent symptoms for more than 2-3 days.    · You have a fever and your symptoms suddenly get worse.    · You have pus, not clear fluid, coming from the sores.    · You have redness that is spreading.    · You have pain or irritation in your  eye.    · You get sores on your genitals.    · Your sores do not heal within 2 weeks.    · You have a weakened immune system.    · You have frequent recurrences of cold sores.    MAKE SURE YOU:   · Understand these instructions.  · Will watch your condition.  · Will get help right away if you are not doing well or get worse.  Document Released: 09/11/2000 Document Revised: 01/29/2014 Document Reviewed: 01/27/2012  Exit[employer]® Patient Information ©2015 [employer]. This information is not intended to replace advice given to you by your health care provider. Make sure you discuss any questions you have with your health care provider.

## 2014-10-03 NOTE — Progress Notes (Signed)
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 She's had this before. This most painful episode however. She also notices that she has some swollen glands under her chin.  Patient's been under great stress with her work load.  Objective: Patient has a 3-4 mm cold sore in the right lower lip with some corresponding mild adenopathy in the submental area on the right.  This chart was scribed in my presence and reviewed by me personally.    ICD-9-CM ICD-10-CM   1. Cold sore 054.9 B00.1 HYDROcodone-acetaminophen (NORCO) 5-325 MG per tablet     valACYclovir (VALTREX) 500 MG tablet     DISCONTINUED: valACYclovir (VALTREX) 500 MG tablet     Signed, Robyn Haber, MD

## 2014-10-05 ENCOUNTER — Telehealth: Payer: Self-pay

## 2014-10-05 DIAGNOSIS — R110 Nausea: Secondary | ICD-10-CM

## 2014-10-05 MED ORDER — ONDANSETRON 8 MG PO TBDP: 8.0000 mg | ORAL_TABLET | Freq: Three times a day (TID) | ORAL | Status: DC | PRN

## 2014-10-05 NOTE — Telephone Encounter (Signed)
Patient requesting medication for nausea. Per patient she was seen by Dr Joseph Art yesterday at the walk in clinic. Requesting it to be [facility] to [facility] on [address] and her call back number is [phone]

## 2015-07-04 ENCOUNTER — Other Ambulatory Visit: Payer: Self-pay | Admitting: Obstetrics and Gynecology

## 2015-07-05 LAB — CYTOLOGY - PAP

## 2015-09-10 ENCOUNTER — Other Ambulatory Visit: Payer: Self-pay | Admitting: Obstetrics and Gynecology

## 2015-09-10 DIAGNOSIS — N644 Mastodynia: Secondary | ICD-10-CM

## 2015-10-03 ENCOUNTER — Ambulatory Visit
Admission: RE | Admit: 2015-10-03 | Discharge: 2015-10-03 | Disposition: A | Discharge status: Home / Self Care | Payer: BC Managed Care – PPO | Source: Ambulatory Visit | Attending: Obstetrics and Gynecology | Admitting: Obstetrics and Gynecology

## 2015-10-03 DIAGNOSIS — N644 Mastodynia: Secondary | ICD-10-CM

## 2015-12-26 ENCOUNTER — Ambulatory Visit (INDEPENDENT_AMBULATORY_CARE_PROVIDER_SITE_OTHER): Payer: BC Managed Care – PPO | Admitting: Family Medicine

## 2015-12-26 ENCOUNTER — Encounter: Payer: Self-pay | Admitting: Family Medicine

## 2015-12-26 VITALS — BP 104/74 | HR 62 | Wt 129.0 lb

## 2015-12-26 DIAGNOSIS — M9904 Segmental and somatic dysfunction of sacral region: Secondary | ICD-10-CM | POA: Diagnosis not present

## 2015-12-26 DIAGNOSIS — M9902 Segmental and somatic dysfunction of thoracic region: Secondary | ICD-10-CM

## 2015-12-26 DIAGNOSIS — M545 Low back pain, unspecified: Secondary | ICD-10-CM

## 2015-12-26 DIAGNOSIS — M999 Biomechanical lesion, unspecified: Secondary | ICD-10-CM

## 2015-12-26 DIAGNOSIS — M9903 Segmental and somatic dysfunction of lumbar region: Secondary | ICD-10-CM | POA: Diagnosis not present

## 2015-12-26 MED ORDER — DICLOFENAC SODIUM 2 % TD SOLN: TRANSDERMAL | Status: DC

## 2015-12-26 NOTE — Assessment & Plan Note (Signed)
Decision today to treat with OMT was based on Physical Exam  After verbal consent patient was treated with HVLA, ME, FPR techniques in cervical, thoracic lumbar and sacral areas  Patient tolerated the procedure well with improvement in symptoms  Patient given exercises, stretches and lifestyle modifications  See medications in patient instructions if given  Patient will follow up in 3-4 weeks

## 2015-12-26 NOTE — Assessment & Plan Note (Signed)
Patient does have low back pain. Patient describes pain as a dull, throbbing aching sensation. We discussed icing regimen, home exercises. Patient is going to try to make certain changes. We discussed ergonomics. Patient work with Product/process development scientist to learn home exercises and did respond well to osteopathic manipulation. Prescription for topical anti-inflammatories given. Discussed over-the-counter natural supplementations. Patient does have treating disc but no true radiculopathy and no positive straight leg test today. Do not feel that further workup her imaging is necessary. Follow-up in 3-4 weeks for further evaluation and treatment.

## 2015-12-26 NOTE — Progress Notes (Signed)
[name] [address], [place] [address] Phone: [phone] Subjective:     CC: Low back pain    [id number] Amanda [name] is a 45 y.o. female coming in with complaint of low back pain. Patient is had this pain intermittently for many years. States that now it seems to be constant. Patient was seen in [place]. At that time she was having such severe pain that an MRI was ordered. Images were independently visualized by me showing the patient did have some mild protruding discs at L4-L5 and L5-S1. Minimal nerve root impingement. No spinal stenosis noted. Patient attempted to try to remain active. States that when she is running on an elliptical she has some mild numbness in the right foot but then it seems to dissipate. No tenderness with walking. States though that sometimes she has to continue to switch positions due to a dull throbbing aching pain of the lower back. Rates the severity of pain a 6 out of 10. Tries not to take anything over-the-counter. Does have pain medication for any type breakthrough pain. Has not had to take it for quite some time.    Past Medical History  Diagnosis Date  . Breast mass 04/2012    left breast  . Depression   . Complication of anesthesia     states was hard to wake up post-op  . Dry cough     since vocal cord surgery  . Sleep related teeth grinding     wears mouth guard at night   Past Surgical History  Procedure Laterality Date  . Vocal cord polyp excision  1999  . Cervix surgery  2005    papilloma  . Hernia repair  as a child    UHR  . Breast mass excision  05/26/2012    left   Social History   Social History  . Marital Status: Married    Spouse Name: N/A  . Number of Children: N/A  . Years of Education: N/A   Social History Main Topics  . Smoking status: Former Smoker -- 25 years    Types: Cigarettes  . Smokeless tobacco: Never Used     Comment: smokes 3 cig./day  . Alcohol Use: 0.0  oz/week    0 Standard drinks or equivalent per week     Comment: occasional wine  . Drug Use: No  . Sexual Activity: Not Asked   Other Topics Concern  . None   Social History Narrative   Allergies  Allergen Reactions  . Chloraprep One Step [Chlorhexidine Gluconate]   . Oxycodone Rash    Rash all over    Family History  Problem Relation Age of Onset  . Cancer Mother     breast  . Cancer Maternal Uncle     brain  . Cancer Maternal Grandmother     leukemia    Past medical history, social, surgical and family history all reviewed in electronic medical record.  No pertanent information unless stated regarding to the chief complaint.   Review of Systems: No headache, visual changes, nausea, vomiting, diarrhea, constipation, dizziness, abdominal pain, skin rash, fevers, chills, night sweats, weight loss, swollen lymph nodes, body aches, joint swelling, muscle aches, chest pain, shortness of breath, mood changes.   Objective Blood pressure 104/74, pulse 62, weight 129 lb (58.514 kg).  General: No apparent distress alert and oriented x3 mood and affect normal, dressed appropriately.  HEENT: Pupils equal, extraocular movements intact  Respiratory: Patient's speak  in full sentences and does not appear short of breath  Cardiovascular: No lower extremity edema, non tender, no erythema  Skin: Warm dry intact with no signs of infection or rash on extremities or on axial skeleton.  Abdomen: Soft nontender  Neuro: Cranial nerves II through XII are intact, neurovascularly intact in all extremities with 2+ DTRs and 2+ pulses.  Lymph: No lymphadenopathy of posterior or anterior cervical chain or axillae bilaterally.  Gait normal with good balance and coordination.  MSK:  Non tender with full range of motion and good stability and symmetric strength and tone of shoulders, elbows, wrist, hip, knee and ankles bilaterally.  Back Exam:  Inspection: Unremarkable  Motion: Flexion 45 deg, Extension  15 deg, Side Bending to 45 deg bilaterally,  Rotation to 45 deg bilaterally  SLR laying: Negative  XSLR laying: Negative  Palpable tenderness: Moderate tenderness to palpation mostly over the sacroiliac joint on the right side FABER: Positive right Sensory change: Gross sensation intact to all lumbar and sacral dermatomes.  Reflexes: 2+ at both patellar tendons, 2+ at achilles tendons, Babinski's downgoing.  Strength at foot  Plantar-flexion: 5/5 Dorsi-flexion: 5/5 Eversion: 5/5 Inversion: 5/5  Leg strength  Quad: 5/5 Hamstring: 5/5 Hip flexor: 5/5 Hip abductors: 4/5  Gait unremarkable.  Osteopathic findings C2 flexed rotated and side bent left C4 flexed rotated and side bent right T3 extended rotated and side bent right T7 extended rotated and side bent left L2 flexed rotated and side bent right Sacrum left on left   Impression and Recommendations:     This case required medical decision making of moderate complexity.      Note: This dictation was prepared with Dragon dictation along with smaller phrase technology. Any transcriptional errors that result from this process are unintentional.

## 2015-12-26 NOTE — Patient Instructions (Signed)
Good to see you.  Ice 20 minutes 2 times daily. Usually after activity and before bed. Exercises 3 times a week.  pennsaid pinkie amount topically 2 times daily as needed.  Turmeric 500mg  twice daily  Vitamin D 3 2000 IU daily  Spenco orthotics "total support" online would be great  Do not hold on to the grips on your elliptical.  See me again in 3 weeks.  You will do great

## 2016-01-16 ENCOUNTER — Encounter: Payer: Self-pay | Admitting: Family Medicine

## 2016-01-16 ENCOUNTER — Ambulatory Visit (INDEPENDENT_AMBULATORY_CARE_PROVIDER_SITE_OTHER): Payer: BC Managed Care – PPO | Admitting: Family Medicine

## 2016-01-16 VITALS — BP 94/60 | HR 74 | Ht 65.25 in | Wt 129.0 lb

## 2016-01-16 DIAGNOSIS — M545 Low back pain, unspecified: Secondary | ICD-10-CM

## 2016-01-16 DIAGNOSIS — M9904 Segmental and somatic dysfunction of sacral region: Secondary | ICD-10-CM

## 2016-01-16 DIAGNOSIS — M9902 Segmental and somatic dysfunction of thoracic region: Secondary | ICD-10-CM | POA: Diagnosis not present

## 2016-01-16 DIAGNOSIS — M999 Biomechanical lesion, unspecified: Secondary | ICD-10-CM

## 2016-01-16 DIAGNOSIS — M9903 Segmental and somatic dysfunction of lumbar region: Secondary | ICD-10-CM | POA: Diagnosis not present

## 2016-01-16 DIAGNOSIS — G5762 Lesion of plantar nerve, left lower limb: Secondary | ICD-10-CM

## 2016-01-16 MED ORDER — PREDNISONE 50 MG PO TABS: 50.0000 mg | ORAL_TABLET | Freq: Every day | ORAL | Status: DC

## 2016-01-16 NOTE — Patient Instructions (Signed)
Good to see you  Prednisone daily for 5 days for the foot.  Ice bath at night Try the pennsaid on the foot as well Do not lace the last eye on your shoe.  For the back do not do as much push ups in the yoga.  See me again in 4 weeks.

## 2016-01-16 NOTE — Assessment & Plan Note (Signed)
Patient still has some mild low back pain secondary to muscle imbalances. We discussed icing. Discussed home exercises.. We discussed also the importance of the core strengthening exercises. Patient continues respond well to osteopathic manipular relation and we'll see her again in 4 weeks.

## 2016-01-16 NOTE — Progress Notes (Signed)
[name] [address], [place] [address] Phone: [phone] Subjective:     CC: Low back pain follow-up  [id number] Amanda [name] is a 45 y.o. female coming in with complaint of low back pain. Patient is had this pain intermittently for many years. States that now it seems to be constant. Patient was seen in [place]. At that time she was having such severe pain that an MRI was ordered. Images were independently visualized by me showing the patient did have some mild protruding discs at L4-L5 and L5-S1. Minimal nerve root impingement. No spinal stenosis noted. Patient's insomnia and did try osteopathic manipulation. Feels that it did help the lower back somewhat. Still feels like she has significant tightness after her yoga classes or running. Patient has been trying the topical anti-inflammatory's with very minimal improvement. Patient has been doing an icing protocol occasionally. Not doing the exercises regularly. Feels like she is doing 20-25% better.  Patient is also complaining of a new problem. Left foot pain. Describes it as more numbness on the lateral aspect of the foot. Worse with chronic pounding or running. States that he gets better when she is not doing activity. Denies any dorsal swelling. Rates the severity of pain a 6 out of 10.    Past Medical History  Diagnosis Date  . Breast mass 04/2012    left breast  . Depression   . Complication of anesthesia     states was hard to wake up post-op  . Dry cough     since vocal cord surgery  . Sleep related teeth grinding     wears mouth guard at night   Past Surgical History  Procedure Laterality Date  . Vocal cord polyp excision  1999  . Cervix surgery  2005    papilloma  . Hernia repair  as a child    UHR  . Breast mass excision  05/26/2012    left   Social History   Social History  . Marital Status: Married    Spouse Name: N/A  . Number of Children: N/A  . Years of  Education: N/A   Social History Main Topics  . Smoking status: Former Smoker -- 25 years    Types: Cigarettes  . Smokeless tobacco: Never Used     Comment: smokes 3 cig./day  . Alcohol Use: 0.0 oz/week    0 Standard drinks or equivalent per week     Comment: occasional wine  . Drug Use: No  . Sexual Activity: Not Asked   Other Topics Concern  . None   Social History Narrative   Allergies  Allergen Reactions  . Chloraprep One Step [Chlorhexidine Gluconate]   . Oxycodone Rash    Rash all over    Family History  Problem Relation Age of Onset  . Cancer Mother     breast  . Cancer Maternal Uncle     brain  . Cancer Maternal Grandmother     leukemia    Past medical history, social, surgical and family history all reviewed in electronic medical record.  No pertanent information unless stated regarding to the chief complaint.   Review of Systems: No headache, visual changes, nausea, vomiting, diarrhea, constipation, dizziness, abdominal pain, skin rash, fevers, chills, night sweats, weight loss, swollen lymph nodes, body aches, joint swelling, muscle aches, chest pain, shortness of breath, mood changes.   Objective Blood pressure 94/60, pulse 74, height 5' 5.25" (1.657 m), weight 129 lb (58.514  kg), SpO2 99 %.  General: No apparent distress alert and oriented x3 mood and affect normal, dressed appropriately.  HEENT: Pupils equal, extraocular movements intact  Respiratory: Patient's speak in full sentences and does not appear short of breath  Cardiovascular: No lower extremity edema, non tender, no erythema  Skin: Warm dry intact with no signs of infection or rash on extremities or on axial skeleton.  Abdomen: Soft nontender  Neuro: Cranial nerves II through XII are intact, neurovascularly intact in all extremities with 2+ DTRs and 2+ pulses.  Lymph: No lymphadenopathy of posterior or anterior cervical chain or axillae bilaterally.  Gait normal with good balance and  coordination.  MSK:  Non tender with full range of motion and good stability and symmetric strength and tone of shoulders, elbows, wrist, hip, knee and ankles bilaterally.  Exam shows the patient does have mild breakdown of the transverse arch bilaterally left couldn't and right. Positive squeeze test. Pain between the third and fourth metatarsal space. Back Exam:  Inspection: Unremarkable  Motion: Flexion 45 deg, Extension 15 deg, Side Bending to 45 deg bilaterally,  Rotation to 45 deg bilaterally  SLR laying: Negative  XSLR laying: Negative  Palpable tenderness: continue mild tenderness over the sacroiliac joint FABER: Positive right Sensory change: Gross sensation intact to all lumbar and sacral dermatomes.  Reflexes: 2+ at both patellar tendons, 2+ at achilles tendons, Babinski's downgoing.  Strength at foot  Plantar-flexion: 5/5 Dorsi-flexion: 5/5 Eversion: 5/5 Inversion: 5/5  Leg strength  Quad: 5/5 Hamstring: 5/5 Hip flexor: 5/5 Hip abductors: 4/5  Gait unremarkable.  Osteopathic findings C2 flexed rotated and side bent left C4 flexed rotated and side bent right T3 extended rotated and side bent right T7 extended rotated and side bent left L2 flexed rotated and side bent right Sacrum left on left   Impression and Recommendations:     This case required medical decision making of moderate complexity.      Note: This dictation was prepared with Dragon dictation along with smaller phrase technology. Any transcriptional errors that result from this process are unintentional.

## 2016-01-16 NOTE — Progress Notes (Signed)
Pre visit review using our clinic review tool, if applicable. No additional management support is needed unless otherwise documented below in the visit note. 

## 2016-01-16 NOTE — Assessment & Plan Note (Signed)
Prednisone given. Discussed lacing of shoe and what activities to avoid. Follow-up if no improvement and possible x-rays will be needed.

## 2016-01-16 NOTE — Assessment & Plan Note (Signed)
Decision today to treat with OMT was based on Physical Exam  After verbal consent patient was treated with HVLA, ME, FPR techniques in cervical, thoracic lumbar and sacral areas  Patient tolerated the procedure well with improvement in symptoms  Patient given exercises, stretches and lifestyle modifications  See medications in patient instructions if given  Patient will follow up in 4 weeks

## 2016-01-30 ENCOUNTER — Telehealth: Payer: Self-pay | Admitting: *Deleted

## 2016-01-30 ENCOUNTER — Other Ambulatory Visit: Payer: Self-pay

## 2016-01-30 MED ORDER — DICLOFENAC SODIUM 1 % TD CREA: TOPICAL_CREAM | TRANSDERMAL | Status: DC

## 2016-01-30 NOTE — Telephone Encounter (Signed)
Received call pt states she would like to have a rx for the diclofenac cream instead of the solution if possible. [facility].../lmb

## 2016-01-30 NOTE — Telephone Encounter (Signed)
Refill done.  

## 2016-02-18 ENCOUNTER — Ambulatory Visit: Payer: BC Managed Care – PPO | Admitting: Family Medicine

## 2016-02-21 ENCOUNTER — Telehealth: Payer: Self-pay | Admitting: Family Medicine

## 2016-02-21 NOTE — Telephone Encounter (Signed)
Patient Name: Amanda [name]  DOB: 1970/11/26    Initial Comment dr put her on prednisone pain in feet and back. she is hurting even more, symptoms got worse when started taking med. med is having opposite effect    Nurse Assessment  Nurse: Mallie Mussel, RN, Alveta Heimlich Date/Time Eilene Ghazi Time): 02/21/2016 10:54:48 AM  Confirm and document reason for call. If symptomatic, describe symptoms. You must click the next button to save text entered. ---Caller states that she was put on Prednisone a few weeks ago, but she didn't begin taking it until this past Monday. Since then, her pain in her feet and back is constant, even when when not exercising. She rates her back pain as 0-10 depending on what position she is in. She has been taking IBU which did help with her pain. She only wants to know if this is normal. She states that she used to only have pain after exercising, now she has it all the time. Advised her that some back pain is common with taking Prednisone per [employer]'s site. Nothing is said about a change in foot pain. She is not a diabetic, nor does she have neuropathy. I advised her that I will have to have someone from the office to call her back about this. She verbalized understanding.  Has the patient traveled out of the country within the last 30 days? ---Not Applicable  Does the patient have any new or worsening symptoms? ---Yes  Will a triage be completed? ---No  Select reason for no triage. ---Other     Guidelines    Guideline Title Affirmed Question Affirmed Notes       Final Disposition User   Clinical Call Mallie Mussel, RN, Alveta Heimlich

## 2016-06-04 ENCOUNTER — Ambulatory Visit: Payer: BC Managed Care – PPO | Admitting: Family Medicine

## 2016-06-23 ENCOUNTER — Encounter: Payer: Self-pay | Admitting: Family Medicine

## 2016-06-23 ENCOUNTER — Ambulatory Visit (INDEPENDENT_AMBULATORY_CARE_PROVIDER_SITE_OTHER): Payer: BC Managed Care – PPO | Admitting: Family Medicine

## 2016-06-23 VITALS — BP 112/70 | HR 75 | Wt 128.0 lb

## 2016-06-23 DIAGNOSIS — M9904 Segmental and somatic dysfunction of sacral region: Secondary | ICD-10-CM

## 2016-06-23 DIAGNOSIS — M9902 Segmental and somatic dysfunction of thoracic region: Secondary | ICD-10-CM

## 2016-06-23 DIAGNOSIS — M545 Low back pain, unspecified: Secondary | ICD-10-CM

## 2016-06-23 DIAGNOSIS — M9903 Segmental and somatic dysfunction of lumbar region: Secondary | ICD-10-CM | POA: Diagnosis not present

## 2016-06-23 DIAGNOSIS — M999 Biomechanical lesion, unspecified: Secondary | ICD-10-CM

## 2016-06-23 NOTE — Assessment & Plan Note (Addendum)
Decision today to treat with OMT was based on Physical Exam  After verbal consent patient was treated with HVLA, ME, FPR techniques in cervical, thoracic areas unable to tolerate sacral manipulation.  Patient tolerated the procedure well with improvement in symptoms  Patient given exercises, stretches and lifestyle modifications  See medications in patient instructions if given  Patient will follow up in 2 weeks

## 2016-06-23 NOTE — Assessment & Plan Note (Addendum)
Patient was found to have more of muscle imbalances. Continues have some tightness. We discussed icing regimen and home exercises. We discussed which activities doing which was to avoid. Patient has taken prednisone previously. We'll see how patient response is. Patient come back and see me again in 4 weeks for further evaluation.

## 2016-06-23 NOTE — Patient Instructions (Signed)
head injury Ice 20 minutes 2 times daily. Usually after activity and before bed. On wall with heels, butt shoulder and head touching for a goal of 5 minutes daily  [place], [employer] son or [employer] black are the best of the best mattresses and firmer is better Turmeric 500mg  twice daily  See me again in 4 weeks.

## 2016-06-23 NOTE — Progress Notes (Signed)
[name] [place], [place] [address] Phone: [phone] Subjective:     CC: Low back pain follow-up  [id number]  Amanda [name] is a 45 y.o. female coming in with complaint of low back pain. Patient is had this pain intermittently for many years. States that now it seems to be constant. Patient was seen in [place]. At that time she was having such severe pain that an MRI was ordered. Images were independently visualized by me showing the patient did have some mild protruding discs at L4-L5 and L5-S1. Minimal nerve root impingement. No spinal stenosis noted. Spent quite some time since patient has been seen. Unfortunately started having acute pain again in her lower back in August. Patient states that worse than previously. Patient did see another provider. MRI was ordered. An MRI was inability visualized by me showing that there was no significant bony abnormality or no recurrent stress fracture. Patient forcing continued have pain. 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 Sent to physical therapy. No improvement at this time. If anything seems to be worsening.     Past Medical History:  Diagnosis Date  . Breast mass 04/2012   left breast  . Complication of anesthesia    states was hard to wake up post-op  . Depression   . Dry cough    since vocal cord surgery  . Sleep related teeth grinding    wears mouth guard at night   Past Surgical History:  Procedure Laterality Date  . BREAST MASS EXCISION  05/26/2012   left  . CERVIX SURGERY  2005   papilloma  . HERNIA REPAIR  as a child   UHR  . vocal cord polyp excision  1999   Social History   Social History  . Marital status: Married    Spouse name: N/A  . Number of children: N/A  . Years of education: N/A   Social History Main Topics  . Smoking status: Former Smoker    Years: 25.00    Types: Cigarettes  . Smokeless tobacco: Never  Used     Comment: smokes 3 cig./day  . Alcohol use 0.0 oz/week     Comment: occasional wine  . Drug use: No  . Sexual activity: Not Asked   Other Topics Concern  . None   Social History Narrative  . None   Allergies  Allergen Reactions  . Chloraprep One Step [Chlorhexidine Gluconate]   . Oxycodone Rash    Rash all over    Family History  Problem Relation Age of Onset  . Cancer Mother     breast  . Cancer Maternal Uncle     brain  . Cancer Maternal Grandmother     leukemia    Past medical history, social, surgical and family history all reviewed in electronic medical record.  No pertanent information unless stated regarding to the chief complaint.   Review of Systems: No headache, visual changes, nausea, vomiting, diarrhea, constipation, dizziness, abdominal pain, skin rash, fevers, chills, night sweats, weight loss, swollen lymph nodes, body aches, joint swelling, muscle aches, chest pain, shortness of breath, mood changes.   Objective  Blood pressure 112/70, pulse 75, weight 128 lb (58.1 kg), SpO2 98 %.  General: No apparent distress alert and oriented x3 mood and affect normal, dressed appropriately.  HEENT: Pupils equal, extraocular movements intact  Respiratory: Patient's speak in full sentences and does not appear short of  breath  Cardiovascular: No lower extremity edema, non tender, no erythema  Skin: Warm dry intact with no signs of infection or rash on extremities or on axial skeleton.  Abdomen: Soft nontender  Neuro: Cranial nerves II through XII are intact, neurovascularly intact in all extremities with 2+ DTRs and 2+ pulses.  Lymph: No lymphadenopathy of posterior or anterior cervical chain or axillae bilaterally.  Gait normal with good balance and coordination.  MSK:  Non tender with full range of motion and good stability and symmetric strength and tone of shoulders, elbows, wrist, hip, knee and ankles bilaterally.  Exam shows the patient does have mild  breakdown of the transverse arch bilaterally left couldn't and right. Positive squeeze test. Pain between the third and fourth metatarsal space. Back Exam:  Inspection: Unremarkable  Motion: Flexion 45 deg, Extension 15 deg, Side Bending to 35 deg bilaterally,  Rotation to 35 deg bilaterally  SLR laying: Negative  XSLR laying: Negative  Palpable tenderness: Severe tenderness over the sacroiliac joints bilaterally. Worse on the left side.  FABER: Positive right as well as left Sensory change: Gross sensation intact to all lumbar and sacral dermatomes.  Reflexes: 2+ at both patellar tendons, 2+ at achilles tendons, Babinski's downgoing.  Strength at foot  Plantar-flexion: 5/5 Dorsi-flexion: 5/5 Eversion: 5/5 Inversion: 5/5  Leg strength  Quad: 5/5 Hamstring: 5/5 Hip flexor: 5/5 Hip abductors: 4/5  Gait unremarkable.  Osteopathic findings C2 flexed rotated and side bent left C4 flexed rotated and side bent right T3 extended rotated and side bent right T7 extended rotated and side bent left L2 flexed rotated and side bent right Sacrum left on left- unable to manipulate secondary to pain.   Impression and Recommendations:     This case required medical decision making of moderate complexity.      Note: This dictation was prepared with Dragon dictation along with smaller phrase technology. Any transcriptional errors that result from this process are unintentional.

## 2016-07-14 ENCOUNTER — Other Ambulatory Visit: Payer: Self-pay | Admitting: Obstetrics and Gynecology

## 2016-07-15 LAB — CYTOLOGY - PAP

## 2016-07-20 NOTE — Progress Notes (Signed)
[name] [place], [place] [address] Phone: [phone] Subjective:     CC: Low back pain follow-up  [id number]  Amanda [name] is a 44 y.o. female coming in with complaint of low back pain. Patient has had chronic issue. States that overall though she seems to be doing relatively well. Has doing the exercises only intermittently. Also not icing. Continues to stay fairly active though. Patient describes the pain as more of a dull, throbbing aching pain. Patient states though when she does think that she is making some mild improvement and does feel that manipulation is helpful.     MRI was ordered. Images were independently visualized by me showing the patient did have some mild protruding discs at L4-L5 and L5-S1. Minimal nerve root impingement. No spinal stenosis noted. ..      Past Medical History:  Diagnosis Date  . Breast mass 04/2012   left breast  . Complication of anesthesia    states was hard to wake up post-op  . Depression   . Dry cough    since vocal cord surgery  . Sleep related teeth grinding    wears mouth guard at night   Past Surgical History:  Procedure Laterality Date  . BREAST MASS EXCISION  05/26/2012   left  . CERVIX SURGERY  2005   papilloma  . HERNIA REPAIR  as a child   UHR  . vocal cord polyp excision  1999   Social History   Social History  . Marital status: Married    Spouse name: N/A  . Number of children: N/A  . Years of education: N/A   Social History Main Topics  . Smoking status: Former Smoker    Years: 25.00    Types: Cigarettes  . Smokeless tobacco: Never Used     Comment: smokes 3 cig./day  . Alcohol use 0.0 oz/week     Comment: occasional wine  . Drug use: No  . Sexual activity: Not Asked   Other Topics Concern  . None   Social History Narrative  . None   Allergies  Allergen Reactions  . Chloraprep One Step [Chlorhexidine Gluconate]   . Oxycodone Rash    Rash all  over    Family History  Problem Relation Age of Onset  . Cancer Mother     breast  . Cancer Maternal Uncle     brain  . Cancer Maternal Grandmother     leukemia    Past medical history, social, surgical and family history all reviewed in electronic medical record.  No pertanent information unless stated regarding to the chief complaint.   Review of Systems: No headache, visual changes, nausea, vomiting, diarrhea, constipation, dizziness, abdominal pain, skin rash, fevers, chills, night sweats, weight loss, swollen lymph nodes, body aches, joint swelling, muscle aches, chest pain, shortness of breath, mood changes.   Objective  Blood pressure 108/70, pulse 66, weight 130 lb 3.2 oz (59.1 kg).  General: No apparent distress alert and oriented x3 mood and affect normal, dressed appropriately.  HEENT: Pupils equal, extraocular movements intact  Respiratory: Patient's speak in full sentences and does not appear short of breath  Cardiovascular: No lower extremity edema, non tender, no erythema  Skin: Warm dry intact with no signs of infection or rash on extremities or on axial skeleton.  Abdomen: Soft nontender  Neuro: Cranial nerves II through XII are intact, neurovascularly intact in all extremities with 2+ DTRs and 2+ pulses.  Lymph: No lymphadenopathy of posterior or anterior cervical chain or axillae bilaterally.  Gait normal with good balance and coordination.  MSK:  Non tender with full range of motion and good stability and symmetric strength and tone of shoulders, elbows, wrist, hip, knee and ankles bilaterally.  Exam shows the patient does have mild breakdown of the transverse arch bilaterally left couldn't and right. Positive squeeze test. Pain between the third and fourth metatarsal space. Back Exam:  Inspection: Unremarkable  Motion: Flexion 45 deg, Extension 15 deg, Side Bending to 35 deg bilaterally,  Rotation to 35 deg bilaterally  SLR laying: Negative  XSLR laying:  Negative  Palpable tenderness: Mild tenderness still over the sacroiliac joint mostly on the left side FABER: Positive on the left Sensory change: Gross sensation intact to all lumbar and sacral dermatomes.  Reflexes: 2+ at both patellar tendons, 2+ at achilles tendons, Babinski's downgoing.  Strength at foot  Plantar-flexion: 5/5 Dorsi-flexion: 5/5 Eversion: 5/5 Inversion: 5/5  Leg strength  Quad: 5/5 Hamstring: 5/5 Hip flexor: 5/5 Hip abductors: 4/5  Gait unremarkable.  Osteopathic findings C2 flexed rotated and side bent left C5 flexed rotated and side bent right T3 extended rotated and side bent right T9 extended rotated and side bent left L2 flexed rotated and side bent right Sacrum left on left- unable to manipulate secondary to pain.   Impression and Recommendations:     This case required medical decision making of moderate complexity.      Note: This dictation was prepared with Dragon dictation along with smaller phrase technology. Any transcriptional errors that result from this process are unintentional.

## 2016-07-20 NOTE — Assessment & Plan Note (Signed)
Decision today to treat with OMT was based on Physical Exam  After verbal consent patient was treated with HVLA, ME, FPR techniques in cervical, thoracic areas unable to tolerate sacral manipulation.  Patient tolerated the procedure well with improvement in symptoms  Patient given exercises, stretches and lifestyle modifications  See medications in patient instructions if given  Patient will follow up in 4-8 weeks

## 2016-07-20 NOTE — Assessment & Plan Note (Signed)
Has been doing very well overall. No significant changes at this point. We will have patient follow-up again in 4-8 weeks.

## 2016-07-21 ENCOUNTER — Encounter: Payer: Self-pay | Admitting: Family Medicine

## 2016-07-21 ENCOUNTER — Ambulatory Visit (INDEPENDENT_AMBULATORY_CARE_PROVIDER_SITE_OTHER): Payer: BC Managed Care – PPO | Admitting: Family Medicine

## 2016-07-21 VITALS — BP 108/70 | HR 66 | Wt 130.2 lb

## 2016-07-21 DIAGNOSIS — M545 Low back pain, unspecified: Secondary | ICD-10-CM

## 2016-07-21 DIAGNOSIS — M999 Biomechanical lesion, unspecified: Secondary | ICD-10-CM | POA: Diagnosis not present

## 2016-07-21 DIAGNOSIS — G8929 Other chronic pain: Secondary | ICD-10-CM | POA: Diagnosis not present

## 2016-07-21 NOTE — Patient Instructions (Signed)
You ae doing great  Ice when you need it On wall with heels, butt shoulder and head touching for a goal of 5 minutes daily  See me again in 4 weeks.  [employer] "total support" online would be great

## 2016-08-12 NOTE — Progress Notes (Signed)
[name] [place], [place] [address] Phone: [phone] Subjective:     CC: Low back pain follow-up  [id number]  Amanda [name] is a 45 y.o. female coming in with complaint of low back pain. Patient continues to have pain overall. States that it has been very mild overall. Continues to when she goes from a sitting to standing position to be the worse. Continues to work out on a regular basis. Patient states it is still gives her trouble only during those activities. States that she does not notice any radiation down the leg or any numbness or tingling. Has had some benefit to the osteopathic manipulation.  Patient did bring in labs and did have a vitamin D level of 25  Ferritin level of 25.    Previous imaging: . Images were independently visualized by me showing the patient did have some mild protruding discs at L4-L5 and L5-S1. Minimal nerve root impingement. No spinal stenosis noted. ..      Past Medical History:  Diagnosis Date  . Breast mass 04/2012   left breast  . Complication of anesthesia    states was hard to wake up post-op  . Depression   . Dry cough    since vocal cord surgery  . Sleep related teeth grinding    wears mouth guard at night   Past Surgical History:  Procedure Laterality Date  . BREAST MASS EXCISION  05/26/2012   left  . CERVIX SURGERY  2005   papilloma  . HERNIA REPAIR  as a child   UHR  . vocal cord polyp excision  1999   Social History   Social History  . Marital status: Married    Spouse name: N/A  . Number of children: N/A  . Years of education: N/A   Social History Main Topics  . Smoking status: Former Smoker    Years: 25.00    Types: Cigarettes  . Smokeless tobacco: Never Used     Comment: smokes 3 cig./day  . Alcohol use 0.0 oz/week     Comment: occasional wine  . Drug use: No  . Sexual activity: Not Asked   Other Topics Concern  . None   Social History Narrative  .  None   Allergies  Allergen Reactions  . Chloraprep One Step [Chlorhexidine Gluconate]   . Oxycodone Rash    Rash all over    Family History  Problem Relation Age of Onset  . Cancer Mother     breast  . Cancer Maternal Uncle     brain  . Cancer Maternal Grandmother     leukemia    Past medical history, social, surgical and family history all reviewed in electronic medical record.  No pertanent information unless stated regarding to the chief complaint.   Review of Systems: No headache, visual changes, nausea, vomiting, diarrhea, constipation, dizziness, abdominal pain, skin rash, fevers, chills, night sweats, weight loss, swollen lymph nodes, body aches, joint swelling, muscle aches, chest pain, shortness of breath, mood changes.   Objective  Blood pressure 110/76, pulse 69, height 5\' 6"  (1.676 m), weight 130 lb (59 kg), SpO2 98 %.  Systems examined below as of 08/13/16 General: NAD A&O x3 mood, affect normal  HEENT: Pupils equal, extraocular movements intact no nystagmus Respiratory: not short of breath at rest or with speaking Cardiovascular: No lower extremity edema, non tender Skin: Warm dry intact with no signs of infection or rash on extremities or on  axial skeleton. Abdomen: Soft nontender, no masses Neuro: Cranial nerves  intact, neurovascularly intact in all extremities with 2+ DTRs and 2+ pulses. Lymph: No lymphadenopathy appreciated today  Gait normal with good balance and coordination.  MSK: Non tender with full range of motion and good stability and symmetric strength and tone of shoulders, elbows, wrist,  knee hips and ankles bilaterally.   Back Exam:  Inspection: Unremarkable  Motion: Flexion 45 deg, Extension 15 deg, Side Bending to 35 deg bilaterally,  Rotation to 35 deg bilaterally  SLR laying: Negative  XSLR laying: Negative  Palpable tenderness: Mild tenderness still over the sacroiliac joint mostly on the left side FABER: Positive on the left Sensory  change: Gross sensation intact to all lumbar and sacral dermatomes.  Reflexes: 2+ at both patellar tendons, 2+ at achilles tendons, Babinski's downgoing.  Strength at foot  Plantar-flexion: 5/5 Dorsi-flexion: 5/5 Eversion: 5/5 Inversion: 5/5  Leg strength  Quad: 5/5 Hamstring: 5/5 Hip flexor: 5/5 Hip abductors: 4/5  Gait unremarkable.  Osteopathic findings C2 flexed rotated and side bent left C6 flexed rotated and side bent right T3 extended rotated and side bent right T8 extended rotated and side bent left L2 flexed rotated and side bent right Sacrum left on left   more tenderness over the period symphysis Impression and Recommendations:     This case required medical decision making of moderate complexity.      Note: This dictation was prepared with Dragon dictation along with smaller phrase technology. Any transcriptional errors that result from this process are unintentional.

## 2016-08-13 ENCOUNTER — Encounter: Payer: Self-pay | Admitting: Family Medicine

## 2016-08-13 ENCOUNTER — Ambulatory Visit (INDEPENDENT_AMBULATORY_CARE_PROVIDER_SITE_OTHER): Payer: BC Managed Care – PPO | Admitting: Family Medicine

## 2016-08-13 VITALS — BP 110/76 | HR 69 | Ht 66.0 in | Wt 130.0 lb

## 2016-08-13 DIAGNOSIS — G8929 Other chronic pain: Secondary | ICD-10-CM | POA: Diagnosis not present

## 2016-08-13 DIAGNOSIS — M545 Low back pain, unspecified: Secondary | ICD-10-CM

## 2016-08-13 DIAGNOSIS — M999 Biomechanical lesion, unspecified: Secondary | ICD-10-CM | POA: Diagnosis not present

## 2016-08-13 MED ORDER — VITAMIN D (ERGOCALCIFEROL) 1.25 MG (50000 UNIT) PO CAPS: 50000.0000 [IU] | ORAL_CAPSULE | ORAL | 0 refills | Status: DC

## 2016-08-13 NOTE — Assessment & Plan Note (Signed)
Decision today to treat with OMT was based on Physical Exam  After verbal consent patient was treated with HVLA, ME, FPR techniques in cervical, thoracic areas unable to tolerate sacral manipulation.  Patient tolerated the procedure well with improvement in symptoms  Patient given exercises, stretches and lifestyle modifications  See medications in patient instructions if given  Patient will follow up in 4-12 weeks

## 2016-08-13 NOTE — Patient Instructions (Addendum)
Thank you for bringing the labs Lets increase vitamin D.  Now once weekly for next 12 weeks.  Watch out for any activity with you dong splits.  Ice is still a good idea A memory foam pillow firm without contour would be great  See me again in 4 weeks.  Happy holidays!

## 2016-08-13 NOTE — Assessment & Plan Note (Signed)
Continued to have mild low back pain. I still think it is more secondary to muscle imbalance and the underlying arthritic changes. Patient does have the MRI showing some mild nerve root impingement the patient does not have any significant radicular symptoms. Patient has topical anti-inflammatories, we discussed icing regimen and home exercises, we discussed which activities to do an which was to avoid. Patient will follow-up again with me in 4 weeks

## 2016-09-09 NOTE — Progress Notes (Signed)
[name] [place], [place] [address] Phone: [phone] Subjective:     CC: Low back pain follow-up  [id number]  Amanda [name] is a 45 y.o. female coming in with complaint of low back pain.Patient states that she get a new mattress. Has made some improvement with the lower back. Some increasing tightness of the neck. Patient has been under more stress recently. Having relation of her kitchen done. We'll take another month     Previous imaging: . Images were independently visualized by me showing the patient did have some mild protruding discs at L4-L5 and L5-S1. Minimal nerve root impingement. No spinal stenosis noted. ..      Past Medical History:  Diagnosis Date  . Breast mass 04/2012   left breast  . Complication of anesthesia    states was hard to wake up post-op  . Depression   . Dry cough    since vocal cord surgery  . Sleep related teeth grinding    wears mouth guard at night   Past Surgical History:  Procedure Laterality Date  . BREAST MASS EXCISION  05/26/2012   left  . CERVIX SURGERY  2005   papilloma  . HERNIA REPAIR  as a child   UHR  . vocal cord polyp excision  1999   Social History   Social History  . Marital status: Married    Spouse name: N/A  . Number of children: N/A  . Years of education: N/A   Social History Main Topics  . Smoking status: Former Smoker    Years: 25.00    Types: Cigarettes  . Smokeless tobacco: Never Used     Comment: smokes 3 cig./day  . Alcohol use 0.0 oz/week     Comment: occasional wine  . Drug use: No  . Sexual activity: Not Asked   Other Topics Concern  . None   Social History Narrative  . None   Allergies  Allergen Reactions  . Chloraprep One Step [Chlorhexidine Gluconate]   . Oxycodone Rash    Rash all over    Family History  Problem Relation Age of Onset  . Cancer Mother     breast  . Cancer Maternal Uncle     brain  . Cancer Maternal Grandmother      leukemia    Past medical history, social, surgical and family history all reviewed in electronic medical record.  No pertanent information unless stated regarding to the chief complaint.   Review of Systems: No headache, visual changes, nausea, vomiting, diarrhea, constipation, dizziness, abdominal pain, skin rash, fevers, chills, night sweats, weight loss, swollen lymph nodes, body aches, joint swelling, muscle aches, chest pain, shortness of breath, mood changes.     Objective  Blood pressure 108/70, pulse 72, height 5\' 5"  (1.651 m), weight 128 lb (58.1 kg), SpO2 97 %.  Systems examined below as of 09/10/16 General: NAD A&O x3 mood, affect normal  HEENT: Pupils equal, extraocular movements intact no nystagmus Respiratory: not short of breath at rest or with speaking Cardiovascular: No lower extremity edema, non tender Skin: Warm dry intact with no signs of infection or rash on extremities or on axial skeleton. Abdomen: Soft nontender, no masses Neuro: Cranial nerves  intact, neurovascularly intact in all extremities with 2+ DTRs and 2+ pulses. Lymph: No lymphadenopathy appreciated today  Gait normal with good balance and coordination.  MSK: Non tender with full range of motion and good stability and symmetric strength and  tone of shoulders, elbows, wrist,  knee hips and ankles bilaterally.     Back Exam:  Inspection: Unremarkable  Motion: Flexion 35 deg, Extension 15 deg, Side Bending to 30 deg bilaterally,  Rotation to 30 deg bilaterally  SLR laying: Negative  XSLR laying: Negative  Palpable tenderness: Mild tenderness more of the thoracolumbar juncture. More on the left side than previous FABER: Positive on the left more range of motion Sensory change: Gross sensation intact to all lumbar and sacral dermatomes.  Reflexes: 2+ at both patellar tendons, 2+ at achilles tendons, Babinski's downgoing.  Strength at foot  Plantar-flexion: 5/5 Dorsi-flexion: 5/5 Eversion: 5/5  Inversion: 5/5  Leg strength  Quad: 5/5 Hamstring: 5/5 Hip flexor: 5/5 Hip abductors: 4+/5 improving Gait unremarkable.  Osteopathic findings C2 flexed rotated and side bent left C5 flexed rotated and side bent right T3 extended rotated and side bent right T7 extended rotated and side bent left L2 flexed rotated and side bent right Sacrum left on left   more tenderness over the period symphysis again today  Impression and Recommendations:     This case required medical decision making of moderate complexity.      Note: This dictation was prepared with Dragon dictation along with smaller phrase technology. Any transcriptional errors that result from this process are unintentional.

## 2016-09-10 ENCOUNTER — Encounter: Payer: Self-pay | Admitting: Family Medicine

## 2016-09-10 ENCOUNTER — Ambulatory Visit (INDEPENDENT_AMBULATORY_CARE_PROVIDER_SITE_OTHER): Payer: BC Managed Care – PPO | Admitting: Family Medicine

## 2016-09-10 VITALS — BP 108/70 | HR 72 | Ht 65.0 in | Wt 128.0 lb

## 2016-09-10 DIAGNOSIS — M999 Biomechanical lesion, unspecified: Secondary | ICD-10-CM | POA: Diagnosis not present

## 2016-09-10 DIAGNOSIS — M545 Low back pain, unspecified: Secondary | ICD-10-CM

## 2016-09-10 DIAGNOSIS — G8929 Other chronic pain: Secondary | ICD-10-CM | POA: Diagnosis not present

## 2016-09-10 NOTE — Assessment & Plan Note (Signed)
Patient still has some mild tightness. Patient is encouraged by the improvement. Patient is doing better with the mattresses well. I think she'll do well overall. We discussed icing regimen and home exercises. Encourage her to continue the core strengthening. Patient will then follow-up with me again in 3-6 weeks.

## 2016-09-10 NOTE — Patient Instructions (Signed)
Happy holidays!  3 weeks.

## 2016-09-10 NOTE — Assessment & Plan Note (Signed)
Decision today to treat with OMT was based on Physical Exam  After verbal consent patient was treated with HVLA, ME, FPR techniques in cervical, thoracic areas unable to tolerate sacral manipulation.  Patient tolerated the procedure well with improvement in symptoms  Patient given exercises, stretches and lifestyle modifications  See medications in patient instructions if given  Patient will follow up in 3-6 weeks

## 2016-09-30 NOTE — Progress Notes (Signed)
[name] [address] [place], [place] [address] Phone: [phone] Subjective:     CC: Low back pain follow-up  [id number]  Amanda [name] is a 46 y.o. female coming in with complaint of low back pain. States she continues to have some tightness. Patient states that unfortunately she continues to have stress as well as been sitting on a more frequent basis. Not working on a regular basis. Patient denies any numbness or tingling though down the extremities. Denies any weakness. States still has some worsening discomfort.    Previous imaging: . Images were independently visualized by me showing the patient did have some mild protruding discs at L4-L5 and L5-S1. Minimal nerve root impingement. No spinal stenosis noted. ..      Past Medical History:  Diagnosis Date  . Breast mass 04/2012   left breast  . Complication of anesthesia    states was hard to wake up post-op  . Depression   . Dry cough    since vocal cord surgery  . Sleep related teeth grinding    wears mouth guard at night   Past Surgical History:  Procedure Laterality Date  . BREAST MASS EXCISION  05/26/2012   left  . CERVIX SURGERY  2005   papilloma  . HERNIA REPAIR  as a child   UHR  . vocal cord polyp excision  1999   Social History   Social History  . Marital status: Married    Spouse name: N/A  . Number of children: N/A  . Years of education: N/A   Social History Main Topics  . Smoking status: Former Smoker    Years: 25.00    Types: Cigarettes  . Smokeless tobacco: Never Used     Comment: smokes 3 cig./day  . Alcohol use 0.0 oz/week     Comment: occasional wine  . Drug use: No  . Sexual activity: Not Asked   Other Topics Concern  . None   Social History Narrative  . None   Allergies  Allergen Reactions  . Chloraprep One Step [Chlorhexidine Gluconate]   . Oxycodone Rash    Rash all over    Family History  Problem Relation Age of Onset  . Cancer  Mother     breast  . Cancer Maternal Uncle     brain  . Cancer Maternal Grandmother     leukemia    Past medical history, social, surgical and family history all reviewed in electronic medical record.  No pertanent information unless stated regarding to the chief complaint.   Review of Systems: No headache, visual changes, nausea, vomiting, diarrhea, constipation, dizziness, abdominal pain, skin rash, fevers, chills, night sweats, weight loss, swollen lymph nodes, body aches, joint swelling, muscle aches, chest pain, shortness of breath, mood changes.        Objective  Blood pressure 100/70, pulse 69, height 5\' 5"  (1.651 m), weight 128 lb (58.1 kg), SpO2 98 %.  Systems examined below as of 10/01/16 General: NAD A&O x3 mood, affect normal  HEENT: Pupils equal, extraocular movements intact no nystagmus Respiratory: not short of breath at rest or with speaking Cardiovascular: No lower extremity edema, non tender Skin: Warm dry intact with no signs of infection or rash on extremities or on axial skeleton. Abdomen: Soft nontender, no masses Neuro: Cranial nerves  intact, neurovascularly intact in all extremities with 2+ DTRs and 2+ pulses. Lymph: No lymphadenopathy appreciated today  Gait normal with good balance and coordination.  MSK: Non tender with full range of motion and good stability and symmetric strength and tone of shoulders, elbows, wrist,  knee hips and ankles bilaterally.      Back Exam:  Inspection: Unremarkable  Motion: Flexion 35 deg, Extension 15 deg, Side Bending to 30 deg bilaterally,  Rotation to 30 deg bilaterally  SLR laying: Negative  XSLR laying: Negative  Palpable tenderness: Continued discomfort of the thoracic lumbar junction. More tightness as well in the cervicothoracic juncture FABER: Continued positive left-sided Sensory change: Gross sensation intact to all lumbar and sacral dermatomes.  Reflexes: 2+ at both patellar tendons, 2+ at achilles tendons,  Babinski's downgoing.  Strength at foot  Plantar-flexion: 5/5 Dorsi-flexion: 5/5 Eversion: 5/5 Inversion: 5/5  Leg strength  Quad: 5/5 Hamstring: 5/5 Hip flexor: 5/5 Hip abductors: 4+/5 stable Gait unremarkable.  Osteopathic findings C2 flexed rotated and side bent left C5 flexed rotated and side bent right T3 extended rotated and side bent right T7 extended rotated and side bent left L2 flexed rotated and side bent right Sacrum left on left   more tenderness over the period symphysis again today  Impression and Recommendations:     This case required medical decision making of moderate complexity.      Note: This dictation was prepared with Dragon dictation along with smaller phrase technology. Any transcriptional errors that result from this process are unintentional.

## 2016-10-01 ENCOUNTER — Ambulatory Visit (INDEPENDENT_AMBULATORY_CARE_PROVIDER_SITE_OTHER): Payer: BC Managed Care – PPO | Admitting: Family Medicine

## 2016-10-01 ENCOUNTER — Encounter: Payer: Self-pay | Admitting: Family Medicine

## 2016-10-01 VITALS — BP 100/70 | HR 69 | Ht 65.0 in | Wt 128.0 lb

## 2016-10-01 DIAGNOSIS — M999 Biomechanical lesion, unspecified: Secondary | ICD-10-CM | POA: Diagnosis not present

## 2016-10-01 DIAGNOSIS — M545 Low back pain, unspecified: Secondary | ICD-10-CM

## 2016-10-01 DIAGNOSIS — G8929 Other chronic pain: Secondary | ICD-10-CM | POA: Diagnosis not present

## 2016-10-01 NOTE — Assessment & Plan Note (Signed)
Decision today to treat with OMT was based on Physical Exam  After verbal consent patient was treated with HVLA, ME, FPR techniques in cervical, thoracic areas unable to tolerate sacral manipulation.  Patient tolerated the procedure well with improvement in symptoms  Patient given exercises, stretches and lifestyle modifications  See medications in patient instructions if given  Patient will follow up in 4-6 weeks

## 2016-10-01 NOTE — Assessment & Plan Note (Signed)
Encouraged patient to continue to work on core strengthening and hip abductors. We discussed icing regimen. Patient has been doing relatively well overall. Does not like to take any medications but will continue the vitamin D supplementation. We discussed the icing regimen. Follow-up again in 4-6 weeks.

## 2016-11-03 ENCOUNTER — Other Ambulatory Visit: Payer: Self-pay | Admitting: Family Medicine

## 2016-11-03 NOTE — Telephone Encounter (Signed)
Refill done.  

## 2016-11-05 ENCOUNTER — Encounter: Payer: Self-pay | Admitting: Family Medicine

## 2016-11-05 ENCOUNTER — Ambulatory Visit (INDEPENDENT_AMBULATORY_CARE_PROVIDER_SITE_OTHER): Payer: BC Managed Care – PPO | Admitting: Family Medicine

## 2016-11-05 VITALS — BP 102/64 | HR 77 | Ht 65.0 in | Wt 126.0 lb

## 2016-11-05 DIAGNOSIS — M545 Low back pain, unspecified: Secondary | ICD-10-CM

## 2016-11-05 DIAGNOSIS — M999 Biomechanical lesion, unspecified: Secondary | ICD-10-CM | POA: Diagnosis not present

## 2016-11-05 DIAGNOSIS — G8929 Other chronic pain: Secondary | ICD-10-CM

## 2016-11-05 NOTE — Progress Notes (Signed)
[name] [address] [place], [place] [address] Phone: [phone] Subjective:     CC: Low back pain follow-up  [id number]  Amanda [name] is a 46 y.o. female coming in with complaint of low back pain. Patient states that she has not been working out for the last 4 weeks. States that she is feeling significantly better. Patient states in not as significant tightness. Feels that the vitamins seemed to be making improvement.    Previous imaging: Images were independently visualized by me showing the patient did have some mild protruding discs at L4-L5 and L5-S1. Minimal nerve root impingement. No spinal stenosis noted. ..      Past Medical History:  Diagnosis Date  . Breast mass 04/2012   left breast  . Complication of anesthesia    states was hard to wake up post-op  . Depression   . Dry cough    since vocal cord surgery  . Sleep related teeth grinding    wears mouth guard at night   Past Surgical History:  Procedure Laterality Date  . BREAST MASS EXCISION  05/26/2012   left  . CERVIX SURGERY  2005   papilloma  . HERNIA REPAIR  as a child   UHR  . vocal cord polyp excision  1999   Social History   Social History  . Marital status: Married    Spouse name: N/A  . Number of children: N/A  . Years of education: N/A   Social History Main Topics  . Smoking status: Former Smoker    Years: 25.00    Types: Cigarettes  . Smokeless tobacco: Never Used     Comment: smokes 3 cig./day  . Alcohol use 0.0 oz/week     Comment: occasional wine  . Drug use: No  . Sexual activity: Not Asked   Other Topics Concern  . None   Social History Narrative  . None   Allergies  Allergen Reactions  . Chloraprep One Step [Chlorhexidine Gluconate]   . Oxycodone Rash    Rash all over    Family History  Problem Relation Age of Onset  . Cancer Mother     breast  . Cancer Maternal Uncle     brain  . Cancer Maternal Grandmother     leukemia      Past medical history, social, surgical and family history all reviewed in electronic medical record.  No pertanent information unless stated regarding to the chief complaint.   Review of Systems: No headache, visual changes, nausea, vomiting, diarrhea, constipation, dizziness, abdominal pain, skin rash, fevers, chills, night sweats, weight loss, swollen lymph nodes, body aches, joint swelling,  chest pain, shortness of breath, mood changes.     Mild positive muscle aches  Objective  Blood pressure 102/64, pulse 77, height 5\' 5"  (1.651 m), weight 126 lb (57.2 kg), SpO2 100 %.  Systems examined below as of 11/05/16 General: NAD A&O x3 mood, affect normal  HEENT: Pupils equal, extraocular movements intact no nystagmus Respiratory: not short of breath at rest or with speaking Cardiovascular: No lower extremity edema, non tender Skin: Warm dry intact with no signs of infection or rash on extremities or on axial skeleton. Abdomen: Soft nontender, no masses Neuro: Cranial nerves  intact, neurovascularly intact in all extremities with 2+ DTRs and 2+ pulses. Lymph: No lymphadenopathy appreciated today  Gait normal with good balance and coordination.  MSK: Non tender with full range of motion and good stability and symmetric  strength and tone of shoulders, elbows, wrist,  knee hips and ankles bilaterally.        Back Exam:  Inspection: Unremarkable  Motion: Flexion 35 deg, Extension 15 deg, Side Bending to 30 deg bilaterally,  Rotation to 30 deg bilaterally  SLR laying: Negative  XSLR laying: Negative  Palpable tenderness: Continued discomfort of the thoracic lumbar junction. More tightness as well in the cervicothoracic juncture FABER: Continued positive left-sided Sensory change: Gross sensation intact to all lumbar and sacral dermatomes.  Reflexes: 2+ at both patellar tendons, 2+ at achilles tendons, Babinski's downgoing.  Strength at foot  Plantar-flexion: 5/5 Dorsi-flexion: 5/5  Eversion: 5/5 Inversion: 5/5  Leg strength  Quad: 5/5 Hamstring: 5/5 Hip flexor: 5/5 Hip abductors: 4+/5 stable Gait unremarkable.   Osteopathic findings Cervical C2 flexed rotated and side bent right C4 flexed rotated and side bent left C6 flexed rotated and side bent left T3 extended rotated and side bent right inhaled third rib T6 extended rotated and side bent left L2 flexed rotated and side bent right L4 flexed rotated and side bent left Sacrum right on right  Impression and Recommendations:     This case required medical decision making of moderate complexity.      Note: This dictation was prepared with Dragon dictation along with smaller phrase technology. Any transcriptional errors that result from this process are unintentional.

## 2016-11-05 NOTE — Patient Instructions (Signed)
You are dong great  Get moving 6 weeks.

## 2016-11-05 NOTE — Assessment & Plan Note (Signed)
Decision today to treat with OMT was based on Physical Exam  After verbal consent patient was treated with HVLA, ME, FPR techniques in cervical, thoracic, lumbar and sacral areas  Patient tolerated the procedure well with improvement in symptoms  Patient given exercises, stretches and lifestyle modifications  See medications in patient instructions if given  Patient will follow up in 4-8 weeks 

## 2016-11-05 NOTE — Assessment & Plan Note (Signed)
Seems stable patient is essentially doing very well. Increase activity as tolerated we discussed icing regimen. Patient will continue stay active. Did respond well to osteopathic manipulation. Follow-up again in 4-8 weeks.

## 2016-12-16 NOTE — Progress Notes (Signed)
[name] [address] [place], [place] [address] Phone: [phone] Subjective:     CC: Low back pain follow-up  [id number]  Amanda [name] is a 46 y.o. female coming in with complaint of low back pain. Continues to be significantly active. Patient states overall she has been doing relatively again. Has not been walking regularly. Not doing it here in the near future though.    Previous imaging: Images were independently visualized by me showing the patient did have some mild protruding discs at L4-L5 and L5-S1. Minimal nerve root impingement. No spinal stenosis noted. ..      Past Medical History:  Diagnosis Date  . Breast mass 04/2012   left breast  . Complication of anesthesia    states was hard to wake up post-op  . Depression   . Dry cough    since vocal cord surgery  . Sleep related teeth grinding    wears mouth guard at night   Past Surgical History:  Procedure Laterality Date  . BREAST MASS EXCISION  05/26/2012   left  . CERVIX SURGERY  2005   papilloma  . HERNIA REPAIR  as a child   UHR  . vocal cord polyp excision  1999   Social History   Social History  . Marital status: Married    Spouse name: N/A  . Number of children: N/A  . Years of education: N/A   Social History Main Topics  . Smoking status: Former Smoker    Years: 25.00    Types: Cigarettes  . Smokeless tobacco: Never Used     Comment: smokes 3 cig./day  . Alcohol use 0.0 oz/week     Comment: occasional wine  . Drug use: No  . Sexual activity: Not Asked   Other Topics Concern  . None   Social History Narrative  . None   Allergies  Allergen Reactions  . Chloraprep One Step [Chlorhexidine Gluconate]   . Oxycodone Rash    Rash all over    Family History  Problem Relation Age of Onset  . Cancer Mother     breast  . Cancer Maternal Uncle     brain  . Cancer Maternal Grandmother     leukemia    Past medical history, social, surgical and  family history all reviewed in electronic medical record.  No pertanent information unless stated regarding to the chief complaint.   Review of Systems: No headache, visual changes, nausea, vomiting, diarrhea, constipation, dizziness, abdominal pain, skin rash, fevers, chills, night sweats, weight loss, swollen lymph nodes, body aches, joint swelling, chest pain, shortness of breath, mood changes.   Still positive muscle aches  Objective  Blood pressure 102/64, pulse 85, height 5\' 5"  (1.651 m), weight 129 lb 6.4 oz (58.7 kg), SpO2 99 %.  Systems examined below as of 12/17/16 General: NAD A&O x3 mood, affect normal  HEENT: Pupils equal, extraocular movements intact no nystagmus Respiratory: not short of breath at rest or with speaking Cardiovascular: No lower extremity edema, non tender Skin: Warm dry intact with no signs of infection or rash on extremities or on axial skeleton. Abdomen: Soft nontender, no masses Neuro: Cranial nerves  intact, neurovascularly intact in all extremities with 2+ DTRs and 2+ pulses. Lymph: No lymphadenopathy appreciated today  Gait normal with good balance and coordination.  MSK: Non tender with full range of motion and good stability and symmetric strength and tone of shoulders, elbows, wrist,  knee hips and  ankles bilaterally.    Back Exam:  Inspection: Unremarkable  Motion: Flexion 35 deg, Extension 15 deg, Side Bending to 30 deg bilaterally,  Rotation to 30 deg bilaterally  SLR laying: Negative  XSLR laying: Negative  Palpable tenderness: Continued discomfort of the thoracic lumbar junction. Increasing tightness around the sacrum bilaterally. This is different than previous. Less tightness of the neck. FABER: Continued positive left-side Sensory change: Gross sensation intact to all lumbar and sacral dermatomes.  Reflexes: 2+ at both patellar tendons, 2+ at achilles tendons, Babinski's downgoing.  Strength seems to be symmetric in the  legs   Osteopathic findings Cervical C2 flexed rotated and side bent right C4 flexed rotated and side bent left C7 flexed rotated and side bent left T3 extended rotated and side bent right inhaled third rib T9 extended rotated and side bent left L3 flexed rotated and side bent right Sacrum right on right   Impression and Recommendations:     This case required medical decision making of moderate complexity.      Note: This dictation was prepared with Dragon dictation along with smaller phrase technology. Any transcriptional errors that result from this process are unintentional.

## 2016-12-17 ENCOUNTER — Encounter: Payer: Self-pay | Admitting: Family Medicine

## 2016-12-17 ENCOUNTER — Ambulatory Visit (INDEPENDENT_AMBULATORY_CARE_PROVIDER_SITE_OTHER): Payer: BC Managed Care – PPO | Admitting: Family Medicine

## 2016-12-17 VITALS — BP 102/64 | HR 85 | Ht 65.0 in | Wt 129.4 lb

## 2016-12-17 DIAGNOSIS — G8929 Other chronic pain: Secondary | ICD-10-CM

## 2016-12-17 DIAGNOSIS — M999 Biomechanical lesion, unspecified: Secondary | ICD-10-CM

## 2016-12-17 DIAGNOSIS — M545 Low back pain, unspecified: Secondary | ICD-10-CM

## 2016-12-17 NOTE — Assessment & Plan Note (Signed)
Decision today to treat with OMT was based on Physical Exam  After verbal consent patient was treated with HVLA, ME, FPR techniques in cervical, thoracic, rib, lumbar and sacral areas  Patient tolerated the procedure well with improvement in symptoms  Patient given exercises, stretches and lifestyle modifications  See medications in patient instructions if given  Patient will follow up in 4-6 weeks 

## 2016-12-17 NOTE — Assessment & Plan Note (Signed)
Stable overall at this time. No significant worsening of pain. 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 Patient though seems to be doing relatively well at this time. Not as severely painful. Patient encouraged to continue to once weekly vitamin D. Patient will see me again in 4-6 weeks for further evaluation and treatment.

## 2016-12-17 NOTE — Patient Instructions (Signed)
HOKA would be great for you for running or walking.  See me again in 4-6 weeks.

## 2017-01-14 ENCOUNTER — Ambulatory Visit (INDEPENDENT_AMBULATORY_CARE_PROVIDER_SITE_OTHER): Payer: BC Managed Care – PPO | Admitting: Family Medicine

## 2017-01-14 ENCOUNTER — Encounter: Payer: Self-pay | Admitting: Family Medicine

## 2017-01-14 VITALS — BP 98/60 | HR 98 | Resp 16 | Wt 125.5 lb

## 2017-01-14 DIAGNOSIS — M999 Biomechanical lesion, unspecified: Secondary | ICD-10-CM | POA: Diagnosis not present

## 2017-01-14 DIAGNOSIS — M545 Low back pain, unspecified: Secondary | ICD-10-CM

## 2017-01-14 DIAGNOSIS — G8929 Other chronic pain: Secondary | ICD-10-CM

## 2017-01-14 NOTE — Assessment & Plan Note (Signed)
Decision today to treat with OMT was based on Physical Exam  After verbal consent patient was treated with HVLA, ME, FPR techniques in cervical, thoracic, lumbar and sacral areas  Patient tolerated the procedure well with improvement in symptoms  Patient given exercises, stretches and lifestyle modifications  See medications in patient instructions if given  Patient will follow up in 4-6 weeks 

## 2017-01-14 NOTE — Progress Notes (Signed)
Pre-visit discussion using our clinic review tool. No additional management support is needed unless otherwise documented below in the visit note.  

## 2017-01-14 NOTE — Progress Notes (Signed)
[name] [address] [place], [place] [address] Phone: [phone] Subjective:     CC: Low back pain follow-up  [id number]  Amanda [name] is a 46 y.o. female coming in with complaint of low back pain. Continues to be very active. Patient is running more frequently. Some mild decrease in pain overall but patient has noticed over the course last 2 days since having more increasing pain in the left lower back. Patient denies any radiationing    Previous imaging: Images were independently visualized by me showing the patient did have some mild protruding discs at L4-L5 and L5-S1. Minimal nerve root impingement. No spinal stenosis noted. ..      Past Medical History:  Diagnosis Date  . Breast mass 04/2012   left breast  . Complication of anesthesia    states was hard to wake up post-op  . Depression   . Dry cough    since vocal cord surgery  . Sleep related teeth grinding    wears mouth guard at night   Past Surgical History:  Procedure Laterality Date  . BREAST MASS EXCISION  05/26/2012   left  . CERVIX SURGERY  2005   papilloma  . HERNIA REPAIR  as a child   UHR  . vocal cord polyp excision  1999   Social History   Social History  . Marital status: Married    Spouse name: N/A  . Number of children: N/A  . Years of education: N/A   Social History Main Topics  . Smoking status: Former Smoker    Years: 25.00    Types: Cigarettes  . Smokeless tobacco: Never Used     Comment: smokes 3 cig./day  . Alcohol use 0.0 oz/week     Comment: occasional wine  . Drug use: No  . Sexual activity: Not Asked   Other Topics Concern  . None   Social History Narrative  . None   Allergies  Allergen Reactions  . Chloraprep One Step [Chlorhexidine Gluconate]   . Oxycodone Rash    Rash all over    Family History  Problem Relation Age of Onset  . Cancer Mother     breast  . Cancer Maternal Uncle     brain  . Cancer Maternal  Grandmother     leukemia    Past medical history, social, surgical and family history all reviewed in electronic medical record.  No pertanent information unless stated regarding to the chief complaint.   Review of Systems: No headache, visual changes, nausea, vomiting, diarrhea, constipation, dizziness, abdominal pain, skin rash, fevers, chills, night sweats, weight loss, swollen lymph nodes, body aches, joint swelling, muscle aches, chest pain, shortness of breath, mood changes.    Objective  Blood pressure 98/60, pulse 98, resp. rate 16, weight 125 lb 8 oz (56.9 kg), SpO2 98 %.  Systems examined below as of 01/14/17 General: NAD A&O x3 mood, affect normal  HEENT: Pupils equal, extraocular movements intact no nystagmus Respiratory: not short of breath at rest or with speaking Cardiovascular: No lower extremity edema, non tender Skin: Warm dry intact with no signs of infection or rash on extremities or on axial skeleton. Abdomen: Soft nontender, no masses Neuro: Cranial nerves  intact, neurovascularly intact in all extremities with 2+ DTRs and 2+ pulses. Lymph: No lymphadenopathy appreciated today  Gait normal with good balance and coordination.  MSK: Non tender with full range of motion and good stability and symmetric strength and tone  of shoulders, elbows, wrist,  knee hips and ankles bilaterally.     Back Exam:  Inspection: Unremarkable  Motion: Flexion 45 deg, Extension 45 deg, Side Bending to 45 deg bilaterally,  Rotation to 45 deg bilaterally  SLR laying: Negative  XSLR laying: Negative  Palpable tenderness: Mild tenderness to palpation of the paraspinal muscle trauma lumbar spine mostly on the left side. FABER: negative. Sensory change: Gross sensation intact to all lumbar and sacral dermatomes.  Reflexes: 2+ at both patellar tendons, 2+ at achilles tendons, Babinski's downgoing.  Strength at foot  Plantar-flexion: 5/5 Dorsi-flexion: 5/5 Eversion: 5/5 Inversion: 5/5  Leg  strength  Quad: 5/5 Hamstring: 5/5 Hip flexor: 5/5 Hip abductors: 5/5  Gait unremarkable.  Osteopathic findings Cervical C2 flexed rotated and side bent right T3 extended rotated and side bent right inhaled third rib T6 extended rotated and side bent left L3 flexed rotated and side bent right Sacrum right on right    Impression and Recommendations:     This case required medical decision making of moderate complexity.      Note: This dictation was prepared with Dragon dictation along with smaller phrase technology. Any transcriptional errors that result from this process are unintentional.

## 2017-01-14 NOTE — Patient Instructions (Signed)
Good to see you  You are awesome  Amanda [name] would be a great brand.  6-8 weeks

## 2017-01-14 NOTE — Assessment & Plan Note (Signed)
Continues to have significant amount of discomfort intimately of the lower back. Patient has been able to continue to be very active. We discussed icing regimen and home exercises. We discussed which activities to do in which ones to avoid.

## 2017-01-29 ENCOUNTER — Other Ambulatory Visit: Payer: Self-pay | Admitting: Family Medicine

## 2017-02-25 ENCOUNTER — Encounter: Payer: Self-pay | Admitting: Family Medicine

## 2017-02-25 ENCOUNTER — Ambulatory Visit (INDEPENDENT_AMBULATORY_CARE_PROVIDER_SITE_OTHER): Payer: BC Managed Care – PPO | Admitting: Family Medicine

## 2017-02-25 VITALS — BP 100/68 | HR 72 | Ht 65.0 in | Wt 129.0 lb

## 2017-02-25 DIAGNOSIS — M999 Biomechanical lesion, unspecified: Secondary | ICD-10-CM

## 2017-02-25 DIAGNOSIS — G8929 Other chronic pain: Secondary | ICD-10-CM | POA: Diagnosis not present

## 2017-02-25 DIAGNOSIS — M545 Low back pain, unspecified: Secondary | ICD-10-CM

## 2017-02-25 NOTE — Assessment & Plan Note (Signed)
Decision today to treat with OMT was based on Physical Exam  After verbal consent patient was treated with HVLA, ME, FPR techniques in cervical, thoracic, lumbar and sacral areas  Patient tolerated the procedure well with improvement in symptoms  Patient given exercises, stretches and lifestyle modifications  See medications in patient instructions if given  Patient will follow up in 8-10 weeks 

## 2017-02-25 NOTE — Assessment & Plan Note (Signed)
Low back pain. We'll continue to monitor. Seems to be multifactorial. Patient patient has done very well over the course of time though. Has done some strengthening of the core which has been very helpful. Follow-up again in 8-9 weeks and patient returns.

## 2017-02-25 NOTE — Patient Instructions (Signed)
9 weeks!!!!

## 2017-02-25 NOTE — Progress Notes (Signed)
[name] [address] [place], [place] [address] Phone: [phone] Subjective:     CC: Low back pain follow-up  [id number]  Amanda [name] is a 46 y.o. female coming in with complaint of low back pain. Continues to be very active.Patient is doing relatively well. Patient states that some mild low back pain. 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 States that they have been somewhat better. Patient will be traveling out of the country for the next 2 months and wants to make sure she is feeling much better.    Previous imaging: Images were independently visualized by me showing the patient did have some mild protruding discs at L4-L5 and L5-S1. Minimal nerve root impingement. No spinal stenosis noted. ..      Past Medical History:  Diagnosis Date  . Breast mass 04/2012   left breast  . Complication of anesthesia    states was hard to wake up post-op  . Depression   . Dry cough    since vocal cord surgery  . Sleep related teeth grinding    wears mouth guard at night   Past Surgical History:  Procedure Laterality Date  . BREAST MASS EXCISION  05/26/2012   left  . CERVIX SURGERY  2005   papilloma  . HERNIA REPAIR  as a child   UHR  . vocal cord polyp excision  1999   Social History   Social History  . Marital status: Married    Spouse name: N/A  . Number of children: N/A  . Years of education: N/A   Social History Main Topics  . Smoking status: Former Smoker    Years: 25.00    Types: Cigarettes  . Smokeless tobacco: Never Used     Comment: smokes 3 cig./day  . Alcohol use 0.0 oz/week     Comment: occasional wine  . Drug use: No  . Sexual activity: Not Asked   Other Topics Concern  . None   Social History Narrative  . None   Allergies  Allergen Reactions  . Chloraprep One Step [Chlorhexidine Gluconate]   . Oxycodone Rash    Rash all over    Family History  Problem Relation Age  of Onset  . Cancer Mother        breast  . Cancer Maternal Uncle        brain  . Cancer Maternal Grandmother        leukemia    Past medical history, social, surgical and family history all reviewed in electronic medical record.  No pertanent information unless stated regarding to the chief complaint.   Review of Systems: No headache, visual changes, nausea, vomiting, diarrhea, constipation, dizziness, abdominal pain, skin rash, fevers, chills, night sweats, weight loss, swollen lymph nodes, body aches, joint swelling,  chest pain, shortness of breath, mood changes.  Mild positive muscle aches  Objective  Blood pressure 100/68, pulse 72, height 5\' 5"  (1.651 m), weight 129 lb (58.5 kg), SpO2 99 %.  Systems examined below as of 02/25/17 General: NAD A&O x3 mood, affect normal  HEENT: Pupils equal, extraocular movements intact no nystagmus Respiratory: not short of breath at rest or with speaking Cardiovascular: No lower extremity edema, non tender Skin: Warm dry intact with no signs of infection or rash on extremities or on axial skeleton. Abdomen: Soft nontender, no masses Neuro: Cranial nerves  intact, neurovascularly intact in all extremities with 2+ DTRs and 2+  pulses. Lymph: No lymphadenopathy appreciated today  Gait normal with good balance and coordination.  MSK: Non tender with full range of motion and good stability and symmetric strength and tone of shoulders, elbows, wrist,  knee hips and ankles bilaterally.    Back Exam:  Inspection: Unremarkable  Motion: Flexion 45 deg, Extension 25 deg, Side Bending to 45 deg bilaterally,  Rotation to 45 deg bilaterally  SLR laying: Negative  XSLR laying: Negative  Palpable tenderness: Tender to palpation of the right sacroiliac joint.Marland Kitchen FABER: Mild tightness on the right. Sensory change: Gross sensation intact to all lumbar and sacral dermatomes.  Reflexes: 2+ at both patellar tendons, 2+ at achilles tendons, Babinski's downgoing.    Strength at foot  Plantar-flexion: 5/5 Dorsi-flexion: 5/5 Eversion: 5/5 Inversion: 5/5  Leg strength  Quad: 5/5 Hamstring: 5/5 Hip flexor: 5/5 Hip abductors: 5/5  Gait unremarkable.   Osteopathic findings C2 flexed rotated and side bent right C5 flexed rotated and side bent left T3 extended rotated and side bent right inhaled third rib T6 extended rotated and side bent left L2 flexed rotated and side bent right Sacrum right on right   Impression and Recommendations:     This case required medical decision making of moderate complexity.      Note: This dictation was prepared with Dragon dictation along with smaller phrase technology. Any transcriptional errors that result from this process are unintentional.

## 2017-03-01 ENCOUNTER — Other Ambulatory Visit: Payer: Self-pay | Admitting: Family Medicine

## 2017-03-02 NOTE — Telephone Encounter (Signed)
Refill done.  

## 2017-05-09 NOTE — Progress Notes (Signed)
[name] [address] [place] [place], [place] [address] Phone: [phone] Subjective:     CC: Low back pain follow-up  [id number]  Amanda [name] is a 46 y.o. female coming in with complaint of low back pain. Continues to be very active.Patient has not been working out regularly. Patient has been out of the country at this time. States that this has helped some. Starting to get back to her regular daily activities with some mild tightness of the neck and upper back.    Previous imaging: Images were independently visualized by me showing the patient did have some mild protruding discs at L4-L5 and L5-S1. Minimal nerve root impingement. No spinal stenosis noted. ..      Past Medical History:  Diagnosis Date  . Breast mass 04/2012   left breast  . Complication of anesthesia    states was hard to wake up post-op  . Depression   . Dry cough    since vocal cord surgery  . Sleep related teeth grinding    wears mouth guard at night   Past Surgical History:  Procedure Laterality Date  . BREAST MASS EXCISION  05/26/2012   left  . CERVIX SURGERY  2005   papilloma  . HERNIA REPAIR  as a child   UHR  . vocal cord polyp excision  1999   Social History   Social History  . Marital status: Married    Spouse name: N/A  . Number of children: N/A  . Years of education: N/A   Social History Main Topics  . Smoking status: Former Smoker    Years: 25.00    Types: Cigarettes  . Smokeless tobacco: Never Used     Comment: smokes 3 cig./day  . Alcohol use 0.0 oz/week     Comment: occasional wine  . Drug use: No  . Sexual activity: Not Asked   Other Topics Concern  . None   Social History Narrative  . None   Allergies  Allergen Reactions  . Chloraprep One Step [Chlorhexidine Gluconate]   . Oxycodone Rash    Rash all over    Family History  Problem Relation Age of Onset  . Cancer Mother        breast  . Cancer Maternal Uncle        brain  .  Cancer Maternal Grandmother        leukemia    Past medical history, social, surgical and family history all reviewed in electronic medical record.  No pertanent information unless stated regarding to the chief complaint.   Review of Systems: No headache, visual changes, nausea, vomiting, diarrhea, constipation, dizziness, abdominal pain, skin rash, fevers, chills, night sweats, weight loss, swollen lymph nodes, body aches, joint swelling,  chest pain, shortness of breath, mood changes.  Positive muscle aches  Objective  Blood pressure 102/64, pulse 76, height 5\' 5"  (1.651 m), weight 132 lb (59.9 kg), SpO2 98 %.  Systems examined below as of 05/10/17 General: NAD A&O x3 mood, affect normal  HEENT: Pupils equal, extraocular movements intact no nystagmus Respiratory: not short of breath at rest or with speaking Cardiovascular: No lower extremity edema, non tender Skin: Warm dry intact with no signs of infection or rash on extremities or on axial skeleton. Abdomen: Soft nontender, no masses Neuro: Cranial nerves  intact, neurovascularly intact in all extremities with 2+ DTRs and 2+ pulses. Lymph: No lymphadenopathy appreciated today  Gait normal with good balance and coordination.  MSK: Non tender with full range of motion and good stability and symmetric strength and tone of shoulders, elbows, wrist,  knee hips and ankles bilaterally.    Neck: Inspection unremarkable. No palpable stepoffs. Negative Spurling's maneuver. Mild limitation lacking the last 5 of extension and rotation to the right Grip strength and sensation normal in bilateral hands Strength good C4 to T1 distribution No sensory change to C4 to T1 Negative Hoffman sign bilaterally Reflexes normal Mild tightness in the scapular region bilaterally in the muscle.  Back Exam:  Inspection: Unremarkable  Motion: Flexion 45 deg, Extension 30 deg, Side Bending to 35 deg bilaterally,  Rotation to 35 deg bilaterally  SLR  laying: Negative  XSLR laying: Negative  Palpable tenderness: More tightness in the thoracolumbar juncture than previously. FABER: negative. Sensory change: Gross sensation intact to all lumbar and sacral dermatomes.  Reflexes: 2+ at both patellar tendons, 2+ at achilles tendons, Babinski's downgoing.  Strength at foot  Plantar-flexion: 5/5 Dorsi-flexion: 5/5 Eversion: 5/5 Inversion: 5/5  Leg strength  Quad: 5/5 Hamstring: 5/5 Hip flexor: 5/5 Hip abductors: 5/5  Gait unremarkable.   Osteopathic findings C2 flexed rotated and side bent right C4 flexed rotated and side bent left C7 flexed rotated and side bent left T3 extended rotated and side bent right inhaled third rib T9 extended rotated and side bent left L2 flexed rotated and side bent right Sacrum right on right    Impression and Recommendations:     This case required medical decision making of moderate complexity.      Note: This dictation was prepared with Dragon dictation along with smaller phrase technology. Any transcriptional errors that result from this process are unintentional.

## 2017-05-10 ENCOUNTER — Ambulatory Visit (INDEPENDENT_AMBULATORY_CARE_PROVIDER_SITE_OTHER): Payer: BC Managed Care – PPO | Admitting: Family Medicine

## 2017-05-10 ENCOUNTER — Encounter: Payer: Self-pay | Admitting: Family Medicine

## 2017-05-10 VITALS — BP 102/64 | HR 76 | Ht 65.0 in | Wt 132.0 lb

## 2017-05-10 DIAGNOSIS — G8929 Other chronic pain: Secondary | ICD-10-CM

## 2017-05-10 DIAGNOSIS — M545 Low back pain, unspecified: Secondary | ICD-10-CM

## 2017-05-10 DIAGNOSIS — M999 Biomechanical lesion, unspecified: Secondary | ICD-10-CM

## 2017-05-10 NOTE — Assessment & Plan Note (Signed)
Stable overall. Encourage patient to start doing the exercises on a more regular basis. We discussed icing regimen, home exercises, which activities doing which ones to avoid. Patient will continue to be active. Patient denies any radiation down the legs. Follow-up again with me in 3 months.

## 2017-05-10 NOTE — Assessment & Plan Note (Addendum)
Decision today to treat with OMT was based on Physical Exam  After verbal consent patient was treated with HVLA, ME, FPR techniques in cervical, thoracic, lumbar and sacral areas  Patient tolerated the procedure well with improvement in symptoms  Patient given exercises, stretches and lifestyle modifications  See medications in patient instructions if given  Patient will follow up in 1-2 weeks 

## 2017-05-11 ENCOUNTER — Other Ambulatory Visit: Payer: Self-pay | Admitting: Obstetrics and Gynecology

## 2017-05-11 DIAGNOSIS — N63 Unspecified lump in unspecified breast: Secondary | ICD-10-CM

## 2017-05-14 ENCOUNTER — Ambulatory Visit
Admission: RE | Admit: 2017-05-14 | Discharge: 2017-05-14 | Disposition: A | Discharge status: Home / Self Care | Payer: BC Managed Care – PPO | Source: Ambulatory Visit | Attending: Obstetrics and Gynecology | Admitting: Obstetrics and Gynecology

## 2017-05-14 DIAGNOSIS — N63 Unspecified lump in unspecified breast: Secondary | ICD-10-CM

## 2017-05-18 ENCOUNTER — Other Ambulatory Visit: Payer: BC Managed Care – PPO

## 2017-05-23 ENCOUNTER — Other Ambulatory Visit: Payer: Self-pay | Admitting: Family Medicine

## 2017-08-10 ENCOUNTER — Encounter: Payer: Self-pay | Admitting: Family Medicine

## 2017-08-10 ENCOUNTER — Ambulatory Visit: Payer: BC Managed Care – PPO | Admitting: Family Medicine

## 2017-08-10 VITALS — BP 100/70 | HR 81 | Ht 64.0 in | Wt 132.0 lb

## 2017-08-10 DIAGNOSIS — M545 Low back pain, unspecified: Secondary | ICD-10-CM

## 2017-08-10 DIAGNOSIS — G8929 Other chronic pain: Secondary | ICD-10-CM | POA: Diagnosis not present

## 2017-08-10 DIAGNOSIS — M999 Biomechanical lesion, unspecified: Secondary | ICD-10-CM

## 2017-08-10 NOTE — Progress Notes (Signed)
[name] [address] [place], [place] [address] Phone: [phone] Subjective:    I'm seeing this patient by the request  of:    CC: Back pain follow-up  ION:[id number]  Amanda [name] is a 46 y.o. female coming in with complaint of back pain follow-up.  Patient has done very well with conservative therapy.  We discussed icing regimen.  We discussed topical anti-inflammatories and icing regimen.  Patient has been doing this relatively regularly.  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  Patient was wondering if there is a way for [place] to give a letter to classes so far away on campus.  Feels that that seems to exacerbate the problem.      Past Medical History:  Diagnosis Date  . Breast mass 04/2012   left breast  . Complication of anesthesia    states was hard to wake up post-op  . Depression   . Dry cough    since vocal cord surgery  . Sleep related teeth grinding    wears mouth guard at night   Past Surgical History:  Procedure Laterality Date  . AUGMENTATION MAMMAPLASTY Bilateral 2014  . BREAST MASS EXCISION  05/26/2012   left  . CERVIX SURGERY  2005   papilloma  . HERNIA REPAIR  as a child   UHR  . vocal cord polyp excision  1999   Social History   Socioeconomic History  . Marital status: Married    Spouse name: None  . Number of children: None  . Years of education: None  . Highest education level: None  Social Needs  . Financial resource strain: None  . Food insecurity - worry: None  . Food insecurity - inability: None  . Transportation needs - medical: None  . Transportation needs - non-medical: None  Occupational History  . None  Tobacco Use  . Smoking status: Former Smoker    Years: 25.00    Types: Cigarettes  . Smokeless tobacco: Never Used  . Tobacco comment: smokes 3 cig./day  Substance and Sexual Activity  . Alcohol use: Yes    Alcohol/week: 0.0 oz   Comment: occasional wine  . Drug use: No  . Sexual activity: None  Other Topics Concern  . None  Social History Narrative  . None   Allergies  Allergen Reactions  . Chloraprep One Step [Chlorhexidine Gluconate]   . Oxycodone Rash    Rash all over    Family History  Problem Relation Age of Onset  . Cancer Mother        breast  . Breast cancer Mother 22  . Cancer Maternal Uncle        brain  . Cancer Maternal Grandmother        leukemia     Past medical history, social, surgical and family history all reviewed in electronic medical record.  No pertanent information unless stated regarding to the chief complaint.   Review of Systems:Review of systems updated and as accurate as of 08/10/17  No headache, visual changes, nausea, vomiting, diarrhea, constipation, dizziness, abdominal pain, skin rash, fevers, chills, night sweats, weight loss, swollen lymph nodes, body aches, joint swelling, muscle aches, chest pain, shortness of breath, mood changes.   Objective  Blood pressure 100/70, pulse 81, height 5\' 4"  (1.626 m), weight 132 lb (59.9 kg), SpO2 98 %. Systems examined below as of 08/10/17   General: No apparent distress  alert and oriented x3 mood and affect normal, dressed appropriately.  HEENT: Pupils equal, extraocular movements intact  Respiratory: Patient's speak in full sentences and does not appear short of breath  Cardiovascular: No lower extremity edema, non tender, no erythema  Skin: Warm dry intact with no signs of infection or rash on extremities or on axial skeleton.  Abdomen: Soft nontender  Neuro: Cranial nerves II through XII are intact, neurovascularly intact in all extremities with 2+ DTRs and 2+ pulses.  Lymph: No lymphadenopathy of posterior or anterior cervical chain or axillae bilaterally.  Gait normal with good balance and coordination.  MSK:  Non tender with full range of motion and good stability and symmetric strength and tone of shoulders, elbows,  wrist, hip, knee and ankles bilaterally.  Back Exam:  Inspection: Mild loss of lordosis Motion: Flexion 40 deg, Extension 25 deg, Side Bending to 40 deg bilaterally,  Rotation to 45 deg bilaterally  SLR laying: Negative  XSLR laying: Negative  Palpable tenderness: Tender to palpation of the paraspinal musculature lumbar spine. FABER: Mild positive bilateral Sensory change: Gross sensation intact to all lumbar and sacral dermatomes.  Reflexes: 2+ at both patellar tendons, 2+ at achilles tendons, Babinski's downgoing.  Strength at foot  Plantar-flexion: 5/5 Dorsi-flexion: 5/5 Eversion: 5/5 Inversion: 5/5  Leg strength  Quad: 5/5 Hamstring: 5/5 Hip flexor: 5/5 Hip abductors: 5/5  Gait unremarkable.  Osteopathic findings C2 flexed rotated and side bent right C4 flexed rotated and side bent left C7 flexed rotated and side bent left T3 extended rotated and side bent right inhaled third rib T5 extended rotated and side bent left L3 flexed rotated and side bent right Sacrum right on right     Impression and Recommendations:     This case required medical decision making of moderate complexity.      Note: This dictation was prepared with Dragon dictation along with smaller phrase technology. Any transcriptional errors that result from this process are unintentional.

## 2017-08-10 NOTE — Assessment & Plan Note (Signed)
Stable at this time.  Continue conservative therapy.  Patient is doing well we will follow-up in 3 months.  Given letter for work to help her decrease the amount of lifting and repetitive motions.

## 2017-08-10 NOTE — Patient Instructions (Signed)
Good to see you  Ice is your friend Will get you the letter and see how you do  Will miss you  See you again in 2-3 months!

## 2017-08-10 NOTE — Assessment & Plan Note (Signed)
Decision today to treat with OMT was based on Physical Exam  After verbal consent patient was treated with HVLA, ME, FPR techniques in cervical, thoracic, lumbar and sacral areas  Patient tolerated the procedure well with improvement in symptoms  Patient given exercises, stretches and lifestyle modifications  See medications in patient instructions if given  Patient will follow up in 4 weeks 

## 2017-08-13 ENCOUNTER — Other Ambulatory Visit: Payer: Self-pay | Admitting: Family Medicine

## 2017-08-13 NOTE — Telephone Encounter (Signed)
Refill done.  

## 2017-10-25 NOTE — Progress Notes (Signed)
[name] [place], [place] [address] Phone: [phone] Subjective:     CC: Low back pain  [id number]  Amanda [name] is a 47 y.o. female coming in for back pain. She said that flexion is still bothering her and it feels like the pain has gotten worse recently.  Patient feels like she is having worsening pain at this time.  Feels like she is having pain along the patient's denies any new injury.  Not doing any extra    small mild stomach pain       Past Medical History:  Diagnosis Date  . Breast mass 04/2012   left breast  . Complication of anesthesia    states was hard to wake up post-op  . Depression   . Dry cough    since vocal cord surgery  . Sleep related teeth grinding    wears mouth guard at night   Past Surgical History:  Procedure Laterality Date  . AUGMENTATION MAMMAPLASTY Bilateral 2014  . BREAST MASS EXCISION  05/26/2012   left  . CERVIX SURGERY  2005   papilloma  . HERNIA REPAIR  as a child   UHR  . vocal cord polyp excision  1999   Social History   Socioeconomic History  . Marital status: Married    Spouse name: None  . Number of children: None  . Years of education: None  . Highest education level: None  Social Needs  . Financial resource strain: None  . Food insecurity - worry: None  . Food insecurity - inability: None  . Transportation needs - medical: None  . Transportation needs - non-medical: None  Occupational History  . None  Tobacco Use  . Smoking status: Former Smoker    Years: 25.00    Types: Cigarettes  . Smokeless tobacco: Never Used  . Tobacco comment: smokes 3 cig./day  Substance and Sexual Activity  . Alcohol use: Yes    Alcohol/week: 0.0 oz    Comment: occasional wine  . Drug use: No  . Sexual activity: None  Other Topics Concern  . None  Social History Narrative  . None   Allergies  Allergen Reactions  . Chloraprep One Step [Chlorhexidine Gluconate]   . Oxycodone  Rash    Rash all over    Family History  Problem Relation Age of Onset  . Cancer Mother        breast  . Breast cancer Mother 49  . Cancer Maternal Uncle        brain  . Cancer Maternal Grandmother        leukemia     Past medical history, social, surgical and family history all reviewed in electronic medical record.  No pertanent information unless stated regarding to the chief complaint.   Review of Systems:Review of systems updated and as accurate as of 10/26/17  No headache, visual changes, nausea, vomiting, diarrhea, constipation, dizziness, abdominal pain, skin rash, fevers, chills, night sweats, weight loss, swollen lymph nodes, body aches, joint swelling, muscle aches, chest pain, shortness of breath, mood changes.   Objective  Blood pressure 108/66, pulse 84, height 5\' 4"  (1.626 m), weight 134 lb (60.8 kg), SpO2 99 %. Systems examined below as of 10/26/17   General: No apparent distress alert and oriented x3 mood and affect normal, dressed appropriately.  HEENT: Pupils equal, extraocular movements intact  Respiratory: Patient's speak in full sentences and does not appear short of breath  Cardiovascular: No  lower extremity edema, non tender, no erythema  Skin: Warm dry intact with no signs of infection or rash on extremities or on axial skeleton.  Abdomen: Soft  Neuro: Cranial nerves II through XII are intact, neurovascularly intact in all extremities with 2+ DTRs and 2+ pulses.  Lymph: No lymphadenopathy of posterior or anterior cervical chain or axillae bilaterally.  Gait normal with good balance and coordination.  MSK:  Non tender with full range of motion and good stability and symmetric strength and tone of shoulders, elbows, wrist, hip, knee and ankles bilaterally.  Back Exam:  Inspection: Unremarkable  Motion: Flexion 45 deg, Extension 45 deg, Side Bending to 45 deg bilaterally,  Rotation to 45 deg bilaterally  SLR laying: Negative  XSLR laying: Negative    Palpable tenderness: Tender to palpation. FABER: negative. Sensory change: Gross sensation intact to all lumbar and sacral dermatomes.  Reflexes: 2+ at both patellar tendons, 2+ at achilles tendons, Babinski's downgoing.  Strength at foot  Plantar-flexion: 5/5 Dorsi-flexion: 5/5 Eversion: 5/5 Inversion: 5/5  Leg strength  Quad: 5/5 Hamstring: 5/5 Hip flexor: 5/5 Hip abductors: 5/5  Gait unremarkable.  Osteopathic findings C2 flexed rotated and side bent right C4 flexed rotated and side bent left T3 extended rotated and side bent right inhaled third rib L2 flexed rotated and side bent right Sacrum right on right     Impression and Recommendations:     This case required medical decision making of moderate complexity.      Note: This dictation was prepared with Dragon dictation along with smaller phrase technology. Any transcriptional errors that result from this process are unintentional.

## 2017-10-26 ENCOUNTER — Ambulatory Visit: Payer: BC Managed Care – PPO | Admitting: Family Medicine

## 2017-10-26 ENCOUNTER — Other Ambulatory Visit: Payer: Self-pay

## 2017-10-26 ENCOUNTER — Encounter: Payer: Self-pay | Admitting: Family Medicine

## 2017-10-26 VITALS — BP 108/66 | HR 84 | Ht 64.0 in | Wt 134.0 lb

## 2017-10-26 DIAGNOSIS — M545 Low back pain, unspecified: Secondary | ICD-10-CM

## 2017-10-26 DIAGNOSIS — G8929 Other chronic pain: Secondary | ICD-10-CM

## 2017-10-26 DIAGNOSIS — K589 Irritable bowel syndrome without diarrhea: Secondary | ICD-10-CM

## 2017-10-26 DIAGNOSIS — M999 Biomechanical lesion, unspecified: Secondary | ICD-10-CM

## 2017-10-26 MED ORDER — DICLOFENAC SODIUM 2 % TD SOLN: 2.0000 g | Freq: Two times a day (BID) | TRANSDERMAL | 3 refills | Status: DC

## 2017-10-26 MED ORDER — VITAMIN D (ERGOCALCIFEROL) 1.25 MG (50000 UNIT) PO CAPS: 50000.0000 [IU] | ORAL_CAPSULE | ORAL | 0 refills | Status: DC

## 2017-10-26 NOTE — Patient Instructions (Addendum)
Great to see you as always.  Stay active. Get back in the groove Consider SI joint  Ice is your friend when needed.  Lets keep it going  Lets get GI in this as well they will call you.  See me again in  1 months

## 2017-10-26 NOTE — Assessment & Plan Note (Signed)
Decision today to treat with OMT was based on Physical Exam  After verbal consent patient was treated with HVLA, ME, FPR techniques in cervical, thoracic, lumbar and sacral areas  Patient tolerated the procedure well with improvement in symptoms  Patient given exercises, stretches and lifestyle modifications  See medications in patient instructions if given  Patient will follow up in 4 weeks 

## 2017-10-26 NOTE — Assessment & Plan Note (Signed)
Patient does have low back pain.  Seems to be musculoskeletal.  We discussed the possibility of sacroiliac injections with the ability of advanced imaging with this being so long.  Patient has declined both and was to continue with conservative therapy.  Follow-up again in 1 month

## 2017-10-28 ENCOUNTER — Encounter: Payer: Self-pay | Admitting: Gastroenterology

## 2017-11-09 ENCOUNTER — Encounter: Payer: Self-pay | Admitting: Gastroenterology

## 2017-11-09 ENCOUNTER — Ambulatory Visit: Payer: BC Managed Care – PPO | Admitting: Gastroenterology

## 2017-11-09 VITALS — BP 72/62 | HR 68 | Ht 63.0 in | Wt 134.2 lb

## 2017-11-09 DIAGNOSIS — R109 Unspecified abdominal pain: Secondary | ICD-10-CM

## 2017-11-09 DIAGNOSIS — R140 Abdominal distension (gaseous): Secondary | ICD-10-CM

## 2017-11-09 MED ORDER — PEG 3350-KCL-NA BICARB-NACL 420 G PO SOLR: 4000.0000 mL | Freq: Once | ORAL | 0 refills | Status: AC

## 2017-11-09 MED ORDER — HYOSCYAMINE SULFATE ER 0.375 MG PO TB12: 0.3750 mg | ORAL_TABLET | Freq: Two times a day (BID) | ORAL | 3 refills | Status: DC

## 2017-11-09 NOTE — Progress Notes (Signed)
HPI: This is a  very pleasant 48 year old woman who was referred to me by [name], MD  to evaluate abdominal pains, cramping, bloating.    Chief complaint is abdominal pain, cramping, bloating  For at least 2 years she has had postprandial cramping, bloating, gassiness.  Eating always leads to pain.  Especially vegetables: pains, cramping, bloating, gassy.  This has been going on for at least 2 years.  Seems to be worse some foods such as vegetables but really it happens with any food now.,   Takes lactaid with any dairy intake.  Takes beano.  Overall her ewight hs been stable over 5 pound range.   BMs daily; can be very loose once a week.  Never sees blood in her stool.  Avoided gluten, sprue test by PCP and says it was normal.   Review of systems: Pertinent positive and negative review of systems were noted in the above HPI section. All other review negative.   Past Medical History:  Diagnosis Date  . Anxiety   . Breast mass 04/2012   left breast  . Complication of anesthesia    states was hard to wake up post-op  . Depression   . Dry cough    since vocal cord surgery  . Sleep related teeth grinding    wears mouth guard at night    Past Surgical History:  Procedure Laterality Date  . AUGMENTATION MAMMAPLASTY Bilateral 2014  . BREAST MASS EXCISION Left 05/26/2012  . CERVIX SURGERY  2005   papilloma  . UMBILICAL HERNIA REPAIR  as a child   UHR  . vocal cord polyp excision  1999    Current Outpatient Medications  Medication Sig Dispense Refill  . buPROPion (ZYBAN) 150 MG 12 hr tablet Take 150 mg by mouth 2 (two) times daily.    . Diclofenac Sodium 2 % SOLN Place 2 g onto the skin 2 (two) times daily. 112 g 3  . FLUoxetine (PROZAC) 20 MG capsule Take 40 mg by mouth daily.    . Milk Thistle 1000 MG CAPS Take 1,000 mg by mouth daily.    . Vitamin D, Ergocalciferol, (DRISDOL) 50000 units CAPS capsule Take 1 capsule (50,000 Units total) by mouth every 7 (seven)  days. 12 capsule 0  . valACYclovir (VALTREX) 500 MG tablet Take 1 tablet (500 mg total) by mouth 2 (two) times daily. (Patient not taking: Reported on 08/10/2017) 6 tablet 10   No current facility-administered medications for this visit.     Allergies as of 11/09/2017 - Review Complete 11/09/2017  Allergen Reaction Noted  . Chloraprep one step [chlorhexidine gluconate]  06/02/2012  . Oxycodone Rash 05/30/2012    Family History  Problem Relation Age of Onset  . Breast cancer Mother 75  . Brain cancer Maternal Uncle   . Leukemia Maternal Grandmother   . Diabetes Maternal Grandfather   . Depression Paternal Grandfather     Social History   Socioeconomic History  . Marital status: Married    Spouse name: Not on file  . Number of children: 1  . Years of education: Not on file  . Highest education level: Not on file  Social Needs  . Financial resource strain: Not on file  . Food insecurity - worry: Not on file  . Food insecurity - inability: Not on file  . Transportation needs - medical: Not on file  . Transportation needs - non-medical: Not on file  Occupational History  . Occupation: Spanish Professor  Tobacco Use  .  Smoking status: Former Smoker    Years: 25.00    Types: Cigarettes  . Smokeless tobacco: Never Used  . Tobacco comment: smokes 3 cig./day  Substance and Sexual Activity  . Alcohol use: Yes    Alcohol/week: 0.0 oz    Comment: occasional wine  . Drug use: No  . Sexual activity: Not on file  Other Topics Concern  . Not on file  Social History Narrative  . Not on file     Physical Exam: Ht 5\' 3"  (1.6 m) Comment: height measured without shoes  Wt 134 lb 4 oz (60.9 kg)   BMI 23.78 kg/m  Constitutional: generally well-appearing Psychiatric: alert and oriented x3 Eyes: extraocular movements intact Mouth: oral pharynx moist, no lesions Neck: supple no lymphadenopathy Cardiovascular: heart regular rate and rhythm Lungs: clear to auscultation  bilaterally Abdomen: soft, nontender, nondistended, no obvious ascites, no peritoneal signs, normal bowel sounds Extremities: no lower extremity edema bilaterally Skin: no lesions on visible extremities   Assessment and plan: 47 y.o. female with bloating, cramping, gassiness  I suspect her symptoms are functional, irritable bowel related but given the severity I recommended endoscopic testing with upper endoscopy and as well as colonoscopy at her soonest convenience.  She will try scheduled twice daily antispasmodic medicines for now as well.    Please see the "Patient Instructions" section for addition details about the plan.   Owens Loffler, MD [facility] 11/09/2017, 3:12 PM  Cc: [name], MD

## 2017-11-09 NOTE — Patient Instructions (Addendum)
You will be set up for a colonoscopy.  You will be set up for an upper endoscopy.  Trial of lebvid 0.375mg  pill one pill twice daily, disp 60 with 3 refills.  Normal BMI (Body Mass Index- based on height and weight) is between 19 and 25. Your BMI today is Body mass index is 23.78 kg/m. Marland Kitchen Please consider follow up  regarding your BMI with your Primary Care Provider.

## 2017-11-25 ENCOUNTER — Ambulatory Visit: Payer: BC Managed Care – PPO | Admitting: Family Medicine

## 2017-11-25 ENCOUNTER — Encounter: Payer: Self-pay | Admitting: Family Medicine

## 2017-11-25 VITALS — BP 108/72 | HR 70 | Ht 63.0 in | Wt 134.0 lb

## 2017-11-25 DIAGNOSIS — M545 Low back pain, unspecified: Secondary | ICD-10-CM

## 2017-11-25 DIAGNOSIS — M999 Biomechanical lesion, unspecified: Secondary | ICD-10-CM

## 2017-11-25 DIAGNOSIS — G8929 Other chronic pain: Secondary | ICD-10-CM | POA: Diagnosis not present

## 2017-11-25 NOTE — Progress Notes (Signed)
[name] [place] [place], [place] [address] Phone: [phone] Subjective:     CC: Back pain follow-up  [id number]  Amanda [name] is a 47 y.o. female coming in with complaint of neck pain.  Patient was also having what appeared to be more of an irritable bowel syndrome.  Patient now is now scheduled to have endoscopy in the near future.  Patient states that she is feeling the same as last visit maybe slightly better.        Past Medical History:  Diagnosis Date  . Anxiety   . Breast mass 04/2012   left breast  . Complication of anesthesia    states was hard to wake up post-op  . Depression   . Dry cough    since vocal cord surgery  . Sleep related teeth grinding    wears mouth guard at night   Past Surgical History:  Procedure Laterality Date  . AUGMENTATION MAMMAPLASTY Bilateral 2014  . BREAST MASS EXCISION Left 05/26/2012  . CERVIX SURGERY  2005   papilloma  . UMBILICAL HERNIA REPAIR  as a child   UHR  . vocal cord polyp excision  1999   Social History   Socioeconomic History  . Marital status: Married    Spouse name: None  . Number of children: 1  . Years of education: None  . Highest education level: None  Social Needs  . Financial resource strain: None  . Food insecurity - worry: None  . Food insecurity - inability: None  . Transportation needs - medical: None  . Transportation needs - non-medical: None  Occupational History  . Occupation: Spanish Professor  Tobacco Use  . Smoking status: Former Smoker    Years: 25.00    Types: Cigarettes  . Smokeless tobacco: Never Used  . Tobacco comment: smokes 3 cig./day  Substance and Sexual Activity  . Alcohol use: Yes    Alcohol/week: 0.0 oz    Comment: occasional wine  . Drug use: No  . Sexual activity: None  Other Topics Concern  . None  Social History Narrative  . None   Allergies  Allergen Reactions  . Chloraprep One Step [Chlorhexidine Gluconate]   .  Oxycodone Rash    Rash all over    Family History  Problem Relation Age of Onset  . Breast cancer Mother 82  . Brain cancer Maternal Uncle   . Leukemia Maternal Grandmother   . Diabetes Maternal Grandfather   . Depression Paternal Grandfather      Past medical history, social, surgical and family history all reviewed in electronic medical record.  No pertanent information unless stated regarding to the chief complaint.   Review of Systems:Review of systems updated and as accurate as of 11/25/17  No headache, visual changes, nausea, vomiting, diarrhea, constipation, dizziness, abdominal pain, skin rash, fevers, chills, night sweats, weight loss, swollen lymph nodes, body aches, joint swelling,  chest pain, shortness of breath, mood changes.  Positive muscle aches  Objective  Blood pressure 108/72, pulse 70, height 5\' 3"  (1.6 m), weight 134 lb (60.8 kg), SpO2 99 %. Systems examined below as of 11/25/17   General: No apparent distress alert and oriented x3 mood and affect normal, dressed appropriately.  HEENT: Pupils equal, extraocular movements intact  Respiratory: Patient's speak in full sentences and does not appear short of breath  Cardiovascular: No lower extremity edema, non tender, no erythema  Skin: Warm dry intact with no signs of infection  or rash on extremities or on axial skeleton.  Abdomen: Soft nontender  Neuro: Cranial nerves II through XII are intact, neurovascularly intact in all extremities with 2+ DTRs and 2+ pulses.  Lymph: No lymphadenopathy of posterior or anterior cervical chain or axillae bilaterally.  Gait normal with good balance and coordination.  MSK:  Non tender with full range of motion and good stability and symmetric strength and tone of shoulders, elbows, wrist, hip, knee and ankles bilaterally.  Back Exam:  Inspection: Unremarkable  Motion: Flexion 45 deg, Extension 25 deg, Side Bending to 35 deg bilaterally,  Rotation to 35 deg bilaterally  SLR  laying: Negative  XSLR laying: Negative  Palpable tenderness: Tender to palpation in the paraspinal musculature lumbar spine L5-S1 right greater than left. FABER: Mild positive on the right. Sensory change: Gross sensation intact to all lumbar and sacral dermatomes.  Reflexes: 2+ at both patellar tendons, 2+ at achilles tendons, Babinski's downgoing.  Strength at foot  Plantar-flexion: 5/5 Dorsi-flexion: 5/5 Eversion: 5/5 Inversion: 5/5  Leg strength  Quad: 5/5 Hamstring: 5/5 Hip flexor: 5/5 Hip abductors: 5/5  Gait unremarkable.  Osteopathic findings C2 flexed rotated and side bent right C4 flexed rotated and side bent left C7 flexed rotated and side bent left T3 extended rotated and side bent right inhaled third rib T5 extended rotated and side bent left L2 flexed rotated and side bent right Sacrum right on right     Impression and Recommendations:     This case required medical decision making of moderate complexity.      Note: This dictation was prepared with Dragon dictation along with smaller phrase technology. Any transcriptional errors that result from this process are unintentional.

## 2017-11-25 NOTE — Assessment & Plan Note (Signed)
Overall seems to be stable.  Discussed icing regimen and home exercises.  Discussed that she should start working on a more regular basis again.  Patient does not want to and does not want any more aggressive therapies though.  Feels like she does well with the osteopathic manipulation.  Follow-up again in 4-5 weeks.

## 2017-11-25 NOTE — Patient Instructions (Signed)
5 weeks

## 2017-12-16 ENCOUNTER — Encounter: Payer: Self-pay | Admitting: Family Medicine

## 2017-12-28 ENCOUNTER — Encounter: Payer: Self-pay | Admitting: Gastroenterology

## 2017-12-30 ENCOUNTER — Encounter: Payer: Self-pay | Admitting: Family Medicine

## 2017-12-30 ENCOUNTER — Ambulatory Visit: Payer: BC Managed Care – PPO | Admitting: Family Medicine

## 2017-12-30 VITALS — BP 100/70 | HR 90 | Ht 63.0 in | Wt 132.0 lb

## 2017-12-30 DIAGNOSIS — G8929 Other chronic pain: Secondary | ICD-10-CM | POA: Diagnosis not present

## 2017-12-30 DIAGNOSIS — M999 Biomechanical lesion, unspecified: Secondary | ICD-10-CM | POA: Diagnosis not present

## 2017-12-30 DIAGNOSIS — M545 Low back pain, unspecified: Secondary | ICD-10-CM

## 2017-12-30 NOTE — Assessment & Plan Note (Addendum)
Back pain.  Discussed icing regimen and home exercise.  Responded very well to osteopathic manipulation.  If worsening symptoms we will consider further evaluation.

## 2017-12-30 NOTE — Assessment & Plan Note (Signed)
Decision today to treat with OMT was based on Physical Exam  After verbal consent patient was treated with HVLA, ME, FPR techniques in cervical, thoracic, lumbar and sacral areas  Patient tolerated the procedure well with improvement in symptoms  Patient given exercises, stretches and lifestyle modifications  See medications in patient instructions if given  Patient will follow up in 6 weeks 

## 2017-12-30 NOTE — Progress Notes (Signed)
[name] [address] [place], [place] [address] Phone: [phone] Subjective:      CC: Back pain follow-up  [id number]  Amanda [name] is a 47 y.o. female coming in with complaint of back pain follow-up.  We discussed icing regimen and home exercises patient states that there is some tightness.  Denies any numbness or tingling.  Rates the severity of pain is 7 out of 10 patient states more tightness than anything else.  Not working out quite as regularly.      Past Medical History:  Diagnosis Date  . Anxiety   . Breast mass 04/2012   left breast  . Complication of anesthesia    states was hard to wake up post-op  . Depression   . Dry cough    since vocal cord surgery  . Sleep related teeth grinding    wears mouth guard at night   Past Surgical History:  Procedure Laterality Date  . AUGMENTATION MAMMAPLASTY Bilateral 2014  . BREAST MASS EXCISION Left 05/26/2012  . CERVIX SURGERY  2005   papilloma  . UMBILICAL HERNIA REPAIR  as a child   UHR  . vocal cord polyp excision  1999   Social History   Socioeconomic History  . Marital status: Married    Spouse name: Not on file  . Number of children: 1  . Years of education: Not on file  . Highest education level: Not on file  Occupational History  . Occupation: Spanish Professor  Social Needs  . Financial resource strain: Not on file  . Food insecurity:    Worry: Not on file    Inability: Not on file  . Transportation needs:    Medical: Not on file    Non-medical: Not on file  Tobacco Use  . Smoking status: Former Smoker    Years: 25.00    Types: Cigarettes  . Smokeless tobacco: Never Used  . Tobacco comment: smokes 3 cig./day  Substance and Sexual Activity  . Alcohol use: Yes    Alcohol/week: 0.0 oz    Comment: occasional wine  . Drug use: No  . Sexual activity: Not on file  Lifestyle  . Physical activity:    Days per week: Not on file    Minutes per session: Not on  file  . Stress: Not on file  Relationships  . Social connections:    Talks on phone: Not on file    Gets together: Not on file    Attends religious service: Not on file    Active member of club or organization: Not on file    Attends meetings of clubs or organizations: Not on file    Relationship status: Not on file  Other Topics Concern  . Not on file  Social History Narrative  . Not on file   Allergies  Allergen Reactions  . Chloraprep One Step [Chlorhexidine Gluconate]   . Oxycodone Rash    Rash all over    Family History  Problem Relation Age of Onset  . Breast cancer Mother 31  . Brain cancer Maternal Uncle   . Leukemia Maternal Grandmother   . Diabetes Maternal Grandfather   . Depression Paternal Grandfather      Past medical history, social, surgical and family history all reviewed in electronic medical record.  No pertanent information unless stated regarding to the chief complaint.   Review of Systems:Review of systems updated and as accurate as of 12/30/17  No headache, visual  changes, nausea, vomiting, diarrhea, constipation, dizziness, abdominal pain, skin rash, fevers, chills, night sweats, weight loss, swollen lymph nodes, body aches, joint swelling, muscle aches, chest pain, shortness of breath, mood changes.   Objective  Blood pressure 100/70, pulse 90, height 5\' 3"  (1.6 m), weight 132 lb (59.9 kg), SpO2 98 %. Systems examined below as of 12/30/17   General: No apparent distress alert and oriented x3 mood and affect normal, dressed appropriately.  HEENT: Pupils equal, extraocular movements intact  Respiratory: Patient's speak in full sentences and does not appear short of breath  Cardiovascular: No lower extremity edema, non tender, no erythema  Skin: Warm dry intact with no signs of infection or rash on extremities or on axial skeleton.  Abdomen: Soft nontender  Neuro: Cranial nerves II through XII are intact, neurovascularly intact in all extremities  with 2+ DTRs and 2+ pulses.  Lymph: No lymphadenopathy of posterior or anterior cervical chain or axillae bilaterally.  Gait normal with good balance and coordination.  MSK:  Non tender with full range of motion and good stability and symmetric strength and tone of shoulders, elbows, wrist, hip, knee and ankles bilaterally.  Neck: Inspection unremarkable. No palpable stepoffs. Negative Spurling's maneuver. Full neck range of motion Grip strength and sensation normal in bilateral hands Strength good C4 to T1 distribution No sensory change to C4 to T1 Negative Hoffman sign bilaterally Reflexes normal  Back Exam:  Inspection: Unremarkable  Motion: Flexion 45 deg, Extension 25 deg, Side Bending to 30 deg bilaterally,  Rotation to 45 deg bilaterally  SLR laying: Negative  XSLR laying: Negative  Palpable tenderness: Tender to palpation the paraspinal musculature lumbar spine. FABER: Mild tightness bilaterally. Sensory change: Gross sensation intact to all lumbar and sacral dermatomes.  Reflexes: 2+ at both patellar tendons, 2+ at achilles tendons, Babinski's downgoing.  Strength at foot  Plantar-flexion: 5/5 Dorsi-flexion: 5/5 Eversion: 5/5 Inversion: 5/5  Leg strength  Quad: 5/5 Hamstring: 5/5 Hip flexor: 5/5 Hip abductors: 5/5  Gait unremarkable.  Osteopathic findings C2 flexed rotated and side bent right C6 flexed rotated and side bent left T3 extended rotated and side bent left inhaled third rib L2 flexed rotated and side bent right Sacrum right on right     Impression and Recommendations:     This case required medical decision making of moderate complexity.      Note: This dictation was prepared with Dragon dictation along with smaller phrase technology. Any transcriptional errors that result from this process are unintentional.

## 2017-12-30 NOTE — Patient Instructions (Signed)
My favorite See me again in 6ish weeks

## 2017-12-30 NOTE — Progress Notes (Signed)
[name] [address] [place], [place] [address] Phone: [phone] Subjective:    I'm seeing this patient by the request  of:    CC:   [id number]  Amanda [name] is a 47 y.o. female coming in with complaint of lumbar spine pain. Patient is still getting pain with exercise but it goes away a few days later.        Past Medical History:  Diagnosis Date  . Anxiety   . Breast mass 04/2012   left breast  . Complication of anesthesia    states was hard to wake up post-op  . Depression   . Dry cough    since vocal cord surgery  . Sleep related teeth grinding    wears mouth guard at night   Past Surgical History:  Procedure Laterality Date  . AUGMENTATION MAMMAPLASTY Bilateral 2014  . BREAST MASS EXCISION Left 05/26/2012  . CERVIX SURGERY  2005   papilloma  . UMBILICAL HERNIA REPAIR  as a child   UHR  . vocal cord polyp excision  1999   Social History   Socioeconomic History  . Marital status: Married    Spouse name: Not on file  . Number of children: 1  . Years of education: Not on file  . Highest education level: Not on file  Occupational History  . Occupation: Spanish Professor  Social Needs  . Financial resource strain: Not on file  . Food insecurity:    Worry: Not on file    Inability: Not on file  . Transportation needs:    Medical: Not on file    Non-medical: Not on file  Tobacco Use  . Smoking status: Former Smoker    Years: 25.00    Types: Cigarettes  . Smokeless tobacco: Never Used  . Tobacco comment: smokes 3 cig./day  Substance and Sexual Activity  . Alcohol use: Yes    Alcohol/week: 0.0 oz    Comment: occasional wine  . Drug use: No  . Sexual activity: Not on file  Lifestyle  . Physical activity:    Days per week: Not on file    Minutes per session: Not on file  . Stress: Not on file  Relationships  . Social connections:    Talks on phone: Not on file    Gets together: Not on file    Attends  religious service: Not on file    Active member of club or organization: Not on file    Attends meetings of clubs or organizations: Not on file    Relationship status: Not on file  Other Topics Concern  . Not on file  Social History Narrative  . Not on file   Allergies  Allergen Reactions  . Chloraprep One Step [Chlorhexidine Gluconate]   . Oxycodone Rash    Rash all over    Family History  Problem Relation Age of Onset  . Breast cancer Mother 29  . Brain cancer Maternal Uncle   . Leukemia Maternal Grandmother   . Diabetes Maternal Grandfather   . Depression Paternal Grandfather      Past medical history, social, surgical and family history all reviewed in electronic medical record.  No pertanent information unless stated regarding to the chief complaint.   Review of Systems:Review of systems updated and as accurate as of 12/30/17  No headache, visual changes, nausea, vomiting, diarrhea, constipation, dizziness, abdominal pain, skin rash, fevers, chills, night sweats, weight loss, swollen lymph nodes, body aches,  joint swelling, muscle aches, chest pain, shortness of breath, mood changes.   Objective  There were no vitals taken for this visit. Systems examined below as of 12/30/17   General: No apparent distress alert and oriented x3 mood and affect normal, dressed appropriately.  HEENT: Pupils equal, extraocular movements intact  Respiratory: Patient's speak in full sentences and does not appear short of breath  Cardiovascular: No lower extremity edema, non tender, no erythema  Skin: Warm dry intact with no signs of infection or rash on extremities or on axial skeleton.  Abdomen: Soft nontender  Neuro: Cranial nerves II through XII are intact, neurovascularly intact in all extremities with 2+ DTRs and 2+ pulses.  Lymph: No lymphadenopathy of posterior or anterior cervical chain or axillae bilaterally.  Gait normal with good balance and coordination.  MSK:  Non tender with  full range of motion and good stability and symmetric strength and tone of shoulders, elbows, wrist, hip, knee and ankles bilaterally.     Impression and Recommendations:     This case required medical decision making of moderate complexity.      Note: This dictation was prepared with Dragon dictation along with smaller phrase technology. Any transcriptional errors that result from this process are unintentional.

## 2018-01-06 ENCOUNTER — Other Ambulatory Visit: Payer: Self-pay | Admitting: Gastroenterology

## 2018-01-11 ENCOUNTER — Encounter: Payer: BC Managed Care – PPO | Admitting: Gastroenterology

## 2018-01-16 ENCOUNTER — Other Ambulatory Visit: Payer: Self-pay | Admitting: Family Medicine

## 2018-02-14 NOTE — Progress Notes (Signed)
[name] [place], [place] [address] Phone: [phone] Subjective:     CC: Back pain follow-up   [id number]  Amanda [name] is a 47 y.o. female coming in with complaint of neck pain. No new symptoms. Patient feels that same as she did last visit. Patient is here for OMT today.  Patient will be traveling out of the country for the next 2 months.  Patient states feels like she needs an adjustment in the back.  Describes the pain as a dull, throbbing aching sensation.  Sometimes can irritate her when she is doing activity but never stops her.    Past Medical History:  Diagnosis Date  . Anxiety   . Breast mass 04/2012   left breast  . Complication of anesthesia    states was hard to wake up post-op  . Depression   . Dry cough    since vocal cord surgery  . Sleep related teeth grinding    wears mouth guard at night   Past Surgical History:  Procedure Laterality Date  . AUGMENTATION MAMMAPLASTY Bilateral 2014  . BREAST MASS EXCISION Left 05/26/2012  . CERVIX SURGERY  2005   papilloma  . UMBILICAL HERNIA REPAIR  as a child   UHR  . vocal cord polyp excision  1999   Social History   Socioeconomic History  . Marital status: Married    Spouse name: Not on file  . Number of children: 1  . Years of education: Not on file  . Highest education level: Not on file  Occupational History  . Occupation: Spanish Professor  Social Needs  . Financial resource strain: Not on file  . Food insecurity:    Worry: Not on file    Inability: Not on file  . Transportation needs:    Medical: Not on file    Non-medical: Not on file  Tobacco Use  . Smoking status: Former Smoker    Years: 25.00    Types: Cigarettes  . Smokeless tobacco: Never Used  . Tobacco comment: smokes 3 cig./day  Substance and Sexual Activity  . Alcohol use: Yes    Alcohol/week: 0.0 oz    Comment: occasional wine  . Drug use: No  . Sexual activity: Not on file    Lifestyle  . Physical activity:    Days per week: Not on file    Minutes per session: Not on file  . Stress: Not on file  Relationships  . Social connections:    Talks on phone: Not on file    Gets together: Not on file    Attends religious service: Not on file    Active member of club or organization: Not on file    Attends meetings of clubs or organizations: Not on file    Relationship status: Not on file  Other Topics Concern  . Not on file  Social History Narrative  . Not on file   Allergies  Allergen Reactions  . Chloraprep One Step [Chlorhexidine Gluconate]   . Oxycodone Rash    Rash all over    Family History  Problem Relation Age of Onset  . Breast cancer Mother 67  . Brain cancer Maternal Uncle   . Leukemia Maternal Grandmother   . Diabetes Maternal Grandfather   . Depression Paternal Grandfather      Past medical history, social, surgical and family history all reviewed in electronic medical record.  No pertanent information unless stated regarding to the  chief complaint.   Review of Systems:Review of systems updated and as accurate as of 02/15/18  No headache, visual changes, nausea, vomiting, diarrhea, constipation, dizziness, abdominal pain, skin rash, fevers, chills, night sweats, weight loss, swollen lymph nodes, body aches, joint swelling,  chest pain, shortness of breath, mood changes.  Positive muscle aches  Objective  Blood pressure 110/74, pulse 82, height 5\' 3"  (1.6 m), weight 129 lb (58.5 kg), SpO2 97 %. Systems examined below as of 02/15/18   General: No apparent distress alert and oriented x3 mood and affect normal, dressed appropriately.  HEENT: Pupils equal, extraocular movements intact  Respiratory: Patient's speak in full sentences and does not appear short of breath  Cardiovascular: No lower extremity edema, non tender, no erythema  Skin: Warm dry intact with no signs of infection or rash on extremities or on axial skeleton.  Abdomen:  Soft nontender  Neuro: Cranial nerves II through XII are intact, neurovascularly intact in all extremities with 2+ DTRs and 2+ pulses.  Lymph: No lymphadenopathy of posterior or anterior cervical chain or axillae bilaterally.  Gait normal with good balance and coordination.  MSK:  Non tender with full range of motion and good stability and symmetric strength and tone of shoulders, elbows, wrist, hip, knee and ankles bilaterally.  Back Exam:  Inspection: Mild loss of lordosis Motion: Flexion 45 deg, Extension 25 deg, Side Bending to 35 deg bilaterally,  Rotation to 35 deg bilaterally  SLR laying: Negative  XSLR laying: Negative  Palpable tenderness: Tender to palpation the paraspinal musculature lumbar spine mostly around the lumbosacral area. FABER: Positive Faber. Sensory change: Gross sensation intact to all lumbar and sacral dermatomes.  Reflexes: 2+ at both patellar tendons, 2+ at achilles tendons, Babinski's downgoing.  Strength at foot  Plantar-flexion: 5/5 Dorsi-flexion: 5/5 Eversion: 5/5 Inversion: 5/5  Leg strength  Quad: 5/5 Hamstring: 5/5 Hip flexor: 5/5 Hip abductors: 5/5  Gait unremarkable.  Osteopathic findings C2 flexed rotated and side bent right C4 flexed rotated and side bent left C7 flexed rotated and side bent left T3 extended rotated and side bent right inhaled third rib T5 extended rotated and side bent left L2 flexed rotated and side bent right Sacrum right on right    Impression and Recommendations:     This case required medical decision making of moderate complexity.      Note: This dictation was prepared with Dragon dictation along with smaller phrase technology. Any transcriptional errors that result from this process are unintentional.

## 2018-02-15 ENCOUNTER — Other Ambulatory Visit: Payer: Self-pay | Admitting: Family Medicine

## 2018-02-15 ENCOUNTER — Ambulatory Visit: Payer: BC Managed Care – PPO | Admitting: Family Medicine

## 2018-02-15 ENCOUNTER — Encounter: Payer: Self-pay | Admitting: Family Medicine

## 2018-02-15 VITALS — BP 110/74 | HR 82 | Ht 63.0 in | Wt 129.0 lb

## 2018-02-15 DIAGNOSIS — M999 Biomechanical lesion, unspecified: Secondary | ICD-10-CM

## 2018-02-15 DIAGNOSIS — G8929 Other chronic pain: Secondary | ICD-10-CM | POA: Diagnosis not present

## 2018-02-15 DIAGNOSIS — M545 Low back pain, unspecified: Secondary | ICD-10-CM

## 2018-02-15 NOTE — Assessment & Plan Note (Signed)
Patient does have some tightness.  Does have some tightness overall.  Discussed icing regimen.  Patient is going to be gone for the next 10 to 12 weeks.  Tolerated manipulation well.  Follow-up at that time

## 2018-02-15 NOTE — Assessment & Plan Note (Signed)
Decision today to treat with OMT was based on Physical Exam  After verbal consent patient was treated with HVLA, ME, FPR techniques in cervical, thoracic, lumbar and sacral areas  Patient tolerated the procedure well with improvement in symptoms  Patient given exercises, stretches and lifestyle modifications  See medications in patient instructions if given  Patient will follow up in 1-2 weeks 

## 2018-02-21 ENCOUNTER — Encounter: Payer: Self-pay | Admitting: Gastroenterology

## 2018-02-22 MED ORDER — HYOSCYAMINE SULFATE ER 0.375 MG PO TB12: 0.3750 mg | ORAL_TABLET | Freq: Two times a day (BID) | ORAL | 3 refills | Status: DC

## 2018-05-24 ENCOUNTER — Other Ambulatory Visit: Payer: Self-pay | Admitting: Obstetrics and Gynecology

## 2018-05-24 DIAGNOSIS — Z1231 Encounter for screening mammogram for malignant neoplasm of breast: Secondary | ICD-10-CM

## 2018-05-25 NOTE — Progress Notes (Signed)
[name] [address] [place], [place] [address] Phone: [phone] Subjective:   [name], am serving as a scribe for Dr. Hulan Saas.   CC: Back pain follow-up  [id number]  Amanda [name] is a 47 y.o. female coming in with complaint of back pain. She has been doing well since last visit. She has been in [place] for the summer. She just got back about 10 days ago. Did mention that she has not had an appetite since she left for [place]. She was sick with a gastric issue the first week she was in [place] and her appetite has not returned. 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  Overall just some mild tightness.     Past Medical History:  Diagnosis Date  . Anxiety   . Breast mass 04/2012   left breast  . Complication of anesthesia    states was hard to wake up post-op  . Depression   . Dry cough    since vocal cord surgery  . Sleep related teeth grinding    wears mouth guard at night   Past Surgical History:  Procedure Laterality Date  . AUGMENTATION MAMMAPLASTY Bilateral 2014  . BREAST MASS EXCISION Left 05/26/2012  . CERVIX SURGERY  2005   papilloma  . UMBILICAL HERNIA REPAIR  as a child   UHR  . vocal cord polyp excision  1999   Social History   Socioeconomic History  . Marital status: Married    Spouse name: Not on file  . Number of children: 1  . Years of education: Not on file  . Highest education level: Not on file  Occupational History  . Occupation: Spanish Professor  Social Needs  . Financial resource strain: Not on file  . Food insecurity:    Worry: Not on file    Inability: Not on file  . Transportation needs:    Medical: Not on file    Non-medical: Not on file  Tobacco Use  . Smoking status: Former Smoker    Years: 25.00    Types: Cigarettes  . Smokeless tobacco: Never Used  . Tobacco comment: smokes 3 cig./day  Substance and Sexual Activity  . Alcohol use: Yes      Alcohol/week: 0.0 standard drinks    Comment: occasional wine  . Drug use: No  . Sexual activity: Not on file  Lifestyle  . Physical activity:    Days per week: Not on file    Minutes per session: Not on file  . Stress: Not on file  Relationships  . Social connections:    Talks on phone: Not on file    Gets together: Not on file    Attends religious service: Not on file    Active member of club or organization: Not on file    Attends meetings of clubs or organizations: Not on file    Relationship status: Not on file  Other Topics Concern  . Not on file  Social History Narrative  . Not on file   Allergies  Allergen Reactions  . Chloraprep One Step [Chlorhexidine Gluconate]   . Oxycodone Rash    Rash all over    Family History  Problem Relation Age of Onset  . Breast cancer Mother 65  . Brain cancer Maternal Uncle   . Leukemia Maternal Grandmother   . Diabetes Maternal Grandfather   . Depression Paternal Grandfather  Current Outpatient Medications (Other):  Marland Kitchen  buPROPion (ZYBAN) 150 MG 12 hr tablet, Take 150 mg by mouth 2 (two) times daily. .  Diclofenac Sodium 2 % SOLN, Place 2 g onto the skin 2 (two) times daily. Marland Kitchen  FLUoxetine (PROZAC) 20 MG capsule, Take 40 mg by mouth daily. .  hyoscyamine (LEVBID) 0.375 MG 12 hr tablet, Take 1 tablet (0.375 mg total) by mouth 2 (two) times daily. .  Milk Thistle 1000 MG CAPS, Take 1,000 mg by mouth daily. .  valACYclovir (VALTREX) 500 MG tablet, Take 1 tablet (500 mg total) by mouth 2 (two) times daily. .  Vitamin D, Ergocalciferol, (DRISDOL) 50000 units CAPS capsule, TAKE 1 CAPSULE (50,000 UNITS TOTAL) BY MOUTH EVERY 7 (SEVEN) DAYS.    Past medical history, social, surgical and family history all reviewed in electronic medical record.  No pertanent information unless stated regarding to the chief complaint.   Review of Systems:  No headache, visual changes, nausea, vomiting, diarrhea, constipation, dizziness,  abdominal pain, skin rash, fevers, chills, night sweats, weight loss, swollen lymph nodes, body aches, joint swelling, chest pain, shortness of breath, mood changes.  Positive muscle aches  Objective  Blood pressure (!) 92/56, pulse 81, height 5\' 3"  (1.6 m), weight 121 lb (54.9 kg), SpO2 98 %.    General: No apparent distress alert and oriented x3 mood and affect normal, dressed appropriately.  HEENT: Pupils equal, extraocular movements intact  Respiratory: Patient's speak in full sentences and does not appear short of breath  Cardiovascular: No lower extremity edema, non tender, no erythema  Skin: Warm dry intact with no signs of infection or rash on extremities or on axial skeleton.  Abdomen: Soft nontender  Neuro: Cranial nerves II through XII are intact, neurovascularly intact in all extremities with 2+ DTRs and 2+ pulses.  Lymph: No lymphadenopathy of posterior or anterior cervical chain or axillae bilaterally.  Gait normal with good balance and coordination.  MSK:  Non tender with full range of motion and good stability and symmetric strength and tone of shoulders, elbows, wrist, hip, knee and ankles bilaterally.  Back Exam:  Inspection: Tender to palpation the paraspinal musculature Motion: Flexion 45 deg, Extension 25 deg, Side Bending to 30 deg bilaterally,  Rotation to 45 deg bilaterally  SLR laying: Negative  XSLR laying: Negative  Palpable tenderness: Tender to palpation in the paraspinal musculature lumbar spine right greater than left. FABER: negative. Sensory change: Gross sensation intact to all lumbar and sacral dermatomes.  Reflexes: 2+ at both patellar tendons, 2+ at achilles tendons, Babinski's downgoing.  Strength at foot  Plantar-flexion: 5/5 Dorsi-flexion: 5/5 Eversion: 5/5 Inversion: 5/5  Leg strength  Quad: 5/5 Hamstring: 5/5 Hip flexor: 5/5 Hip abductors: 4/5 but symmetric Gait unremarkable.  Osteopathic findings C6 flexed rotated and side bent left T5  extended rotated and side bent right inhaled rib  L2 flexed rotated and side bent right Sacrum right on right     Impression and Recommendations:     This case required medical decision making of moderate complexity. The above documentation has been reviewed and is accurate and complete [name], DO       Note: This dictation was prepared with Dragon dictation along with smaller phrase technology. Any transcriptional errors that result from this process are unintentional.

## 2018-05-26 ENCOUNTER — Ambulatory Visit: Payer: Self-pay

## 2018-05-26 ENCOUNTER — Encounter: Payer: Self-pay | Admitting: Family Medicine

## 2018-05-26 ENCOUNTER — Ambulatory Visit: Payer: BC Managed Care – PPO | Admitting: Family Medicine

## 2018-05-26 VITALS — BP 92/56 | HR 81 | Ht 63.0 in | Wt 121.0 lb

## 2018-05-26 DIAGNOSIS — G8929 Other chronic pain: Secondary | ICD-10-CM | POA: Diagnosis not present

## 2018-05-26 DIAGNOSIS — M545 Low back pain, unspecified: Secondary | ICD-10-CM

## 2018-05-26 DIAGNOSIS — M999 Biomechanical lesion, unspecified: Secondary | ICD-10-CM

## 2018-05-26 DIAGNOSIS — M25539 Pain in unspecified wrist: Principal | ICD-10-CM

## 2018-05-26 NOTE — Patient Instructions (Signed)
6 weeks just to check on you

## 2018-05-26 NOTE — Assessment & Plan Note (Signed)
Decision today to treat with OMT was based on Physical Exam  After verbal consent patient was treated with HVLA, ME, FPR techniques in cervical, thoracic, lumbar and sacral areas  Patient tolerated the procedure well with improvement in symptoms  Patient given exercises, stretches and lifestyle modifications  See medications in patient instructions if given  Patient will follow up in 4 weeks 

## 2018-05-26 NOTE — Assessment & Plan Note (Signed)
Low back pain.  Discussed proper technique with lifting, ergonomics throughout the day.  Patient doing relatively well overall.  Discussed icing regimen.  Patient will continue vitamin supplementation.  Follow-up again in 4 weeks

## 2018-06-03 ENCOUNTER — Other Ambulatory Visit: Payer: Self-pay | Admitting: Family Medicine

## 2018-06-03 DIAGNOSIS — N6320 Unspecified lump in the left breast, unspecified quadrant: Secondary | ICD-10-CM

## 2018-06-04 ENCOUNTER — Other Ambulatory Visit: Payer: Self-pay | Admitting: Family Medicine

## 2018-06-06 NOTE — Telephone Encounter (Signed)
Refill done.  

## 2018-06-09 ENCOUNTER — Ambulatory Visit
Admission: RE | Admit: 2018-06-09 | Discharge: 2018-06-09 | Disposition: A | Discharge status: Home / Self Care | Payer: BC Managed Care – PPO | Source: Ambulatory Visit | Attending: Family Medicine | Admitting: Family Medicine

## 2018-06-09 DIAGNOSIS — N6320 Unspecified lump in the left breast, unspecified quadrant: Secondary | ICD-10-CM

## 2018-06-21 ENCOUNTER — Telehealth: Payer: Self-pay | Admitting: Gastroenterology

## 2018-06-21 ENCOUNTER — Ambulatory Visit: Payer: BC Managed Care – PPO

## 2018-06-21 NOTE — Telephone Encounter (Signed)
Dr Jacobs is this ok? 

## 2018-06-21 NOTE — Telephone Encounter (Signed)
ok 

## 2018-06-21 NOTE — Telephone Encounter (Signed)
Fine by me. Glad to hear she is doing well.  Thanks

## 2018-06-21 NOTE — Telephone Encounter (Signed)
Dr Silverio Decamp is this ok with you?

## 2018-06-23 NOTE — Telephone Encounter (Signed)
Appt scheduled on 08/09/18 at 1:45 pm. W. Dr. Silverio Decamp.

## 2018-07-11 NOTE — Progress Notes (Signed)
[name] [address] [place], [place] [address] Phone: [phone] Subjective:     CC: Low back pain  [id number]  Amanda [name] is a 47 y.o. female coming in with complaint of back pain.  Seems to be worse with some underlying anxiety.  Patient is smoking on a regular basis is not having difficulty quitting.  Was doing it more when she was in [place] over the summer.  Increasing stress at work at the moment.  Patient feels that this is affecting daily activities and causing more pain.    Past Medical History:  Diagnosis Date  . Anxiety   . Breast mass 04/2012   left breast  . Complication of anesthesia    states was hard to wake up post-op  . Depression   . Dry cough    since vocal cord surgery  . Sleep related teeth grinding    wears mouth guard at night   Past Surgical History:  Procedure Laterality Date  . AUGMENTATION MAMMAPLASTY Bilateral 2014  . BREAST MASS EXCISION Left 05/26/2012  . CERVIX SURGERY  2005   papilloma  . UMBILICAL HERNIA REPAIR  as a child   UHR  . vocal cord polyp excision  1999   Social History   Socioeconomic History  . Marital status: Married    Spouse name: Not on file  . Number of children: 1  . Years of education: Not on file  . Highest education level: Not on file  Occupational History  . Occupation: Spanish Professor  Social Needs  . Financial resource strain: Not on file  . Food insecurity:    Worry: Not on file    Inability: Not on file  . Transportation needs:    Medical: Not on file    Non-medical: Not on file  Tobacco Use  . Smoking status: Former Smoker    Years: 25.00    Types: Cigarettes  . Smokeless tobacco: Never Used  . Tobacco comment: smokes 3 cig./day  Substance and Sexual Activity  . Alcohol use: Yes    Alcohol/week: 0.0 standard drinks    Comment: occasional wine  . Drug use: No  . Sexual activity: Not on file  Lifestyle  . Physical activity:    Days per week: Not on  file    Minutes per session: Not on file  . Stress: Not on file  Relationships  . Social connections:    Talks on phone: Not on file    Gets together: Not on file    Attends religious service: Not on file    Active member of club or organization: Not on file    Attends meetings of clubs or organizations: Not on file    Relationship status: Not on file  Other Topics Concern  . Not on file  Social History Narrative  . Not on file   Allergies  Allergen Reactions  . Chloraprep One Step [Chlorhexidine Gluconate]   . Oxycodone Rash    Rash all over    Family History  Problem Relation Age of Onset  . Breast cancer Mother 15  . Brain cancer Maternal Uncle   . Leukemia Maternal Grandmother   . Diabetes Maternal Grandfather   . Depression Paternal Grandfather          Current Outpatient Medications (Other):  Marland Kitchen  Diclofenac Sodium 2 % SOLN, Place 2 g onto the skin 2 (two) times daily. Marland Kitchen  FLUoxetine (PROZAC) 20 MG capsule, Take 40 mg  by mouth daily. .  hyoscyamine (LEVBID) 0.375 MG 12 hr tablet, Take 1 tablet (0.375 mg total) by mouth 2 (two) times daily. .  Milk Thistle 1000 MG CAPS, Take 1,000 mg by mouth daily. .  valACYclovir (VALTREX) 500 MG tablet, Take 1 tablet (500 mg total) by mouth 2 (two) times daily. .  Vitamin D, Ergocalciferol, (DRISDOL) 50000 units CAPS capsule, TAKE 1 CAPSULE (50,000 UNITS TOTAL) BY MOUTH EVERY 7 (SEVEN) DAYS. Marland Kitchen  buPROPion (ZYBAN) 150 MG 12 hr tablet, Take 150 mg by mouth 2 (two) times daily. .  hydrOXYzine (ATARAX/VISTARIL) 10 MG tablet, Take 1 tablet (10 mg total) by mouth 3 (three) times daily as needed. .  venlafaxine XR (EFFEXOR XR) 37.5 MG 24 hr capsule, Take 1 capsule (37.5 mg total) by mouth daily with breakfast.    Past medical history, social, surgical and family history all reviewed in electronic medical record.  No pertanent information unless stated regarding to the chief complaint.   Review of Systems:  No headache, visual changes,  nausea, vomiting, diarrhea, constipation, dizziness, abdominal pain, skin rash, fevers, chills, night sweats, weight loss, swollen lymph nodes, body aches, joint swelling, chest pain, shortness of breath, mood changes.  Positive muscle aches  Objective  Blood pressure 94/62, pulse 78, resp. rate 16, weight 126 lb (57.2 kg), SpO2 98 %.    General: No apparent distress alert and oriented x3 mood and affect normal, dressed appropriately.  HEENT: Pupils equal, extraocular movements intact  Respiratory: Patient's speak in full sentences and does not appear short of breath  Cardiovascular: No lower extremity edema, non tender, no erythema  Skin: Warm dry intact with no signs of infection or rash on extremities or on axial skeleton.  Abdomen: Soft nontender  Neuro: Cranial nerves II through XII are intact, neurovascularly intact in all extremities with 2+ DTRs and 2+ pulses.  Lymph: No lymphadenopathy of posterior or anterior cervical chain or axillae bilaterally.  Gait normal with good balance and coordination.  MSK:  Non tender with full range of motion and good stability and symmetric strength and tone of shoulders, elbows, wrist, hip, knee and ankles bilaterally.  Patient neck exam does have loss of lordosis.  Tenderness to palpation more in the C7-T1 area.  Patient does have negative Spurling's.  Mild crepitus with range of motion.  Tenderness in the trapezius bilaterally  Low back does have tightness.  Patient does have some tenderness over the piriformis bilaterally.  Positive Faber test on the right.  Osteopathic findings C2 flexed rotated and side bent right C4 flexed rotated and side bent left T3 extended rotated and side bent right inhaled third rib T7 extended rotated and side bent left L2 flexed rotated and side bent right Sacrum right on right     Impression and Recommendations:     This case required medical decision making of moderate complexity. The above documentation has  been reviewed and is accurate and complete [name], DO       Note: This dictation was prepared with Dragon dictation along with smaller phrase technology. Any transcriptional errors that result from this process are unintentional.

## 2018-07-12 ENCOUNTER — Ambulatory Visit: Payer: BC Managed Care – PPO | Admitting: Family Medicine

## 2018-07-12 ENCOUNTER — Encounter: Payer: Self-pay | Admitting: Family Medicine

## 2018-07-12 VITALS — BP 94/62 | HR 78 | Resp 16 | Wt 126.0 lb

## 2018-07-12 DIAGNOSIS — G8929 Other chronic pain: Secondary | ICD-10-CM

## 2018-07-12 DIAGNOSIS — F411 Generalized anxiety disorder: Secondary | ICD-10-CM

## 2018-07-12 DIAGNOSIS — M999 Biomechanical lesion, unspecified: Secondary | ICD-10-CM

## 2018-07-12 DIAGNOSIS — M545 Low back pain, unspecified: Secondary | ICD-10-CM

## 2018-07-12 MED ORDER — VENLAFAXINE HCL ER 37.5 MG PO CP24: 37.5000 mg | ORAL_CAPSULE | Freq: Every day | ORAL | 1 refills | Status: DC

## 2018-07-12 MED ORDER — HYDROXYZINE HCL 10 MG PO TABS: 10.0000 mg | ORAL_TABLET | Freq: Three times a day (TID) | ORAL | 0 refills | Status: DC | PRN

## 2018-07-12 NOTE — Patient Instructions (Signed)
Good to see you  Calcium pyruvate 1500mg  daily  Start effexor 37.5 mg daily  Only 20 mg of prozac for now  Stay off the well butrin  See me again in 3-4 weeks

## 2018-07-12 NOTE — Assessment & Plan Note (Signed)
Low back pain.  Multifactorial.  Has been doing relatively well but I do feel that the stress is contributing.  Patient has not been doing significantly better with the medication she is on at this moment.  Given hydroxyzine, decrease Prozac to 20 mg, discontinue the Wellbutrin with patient not having been on it for quite some time at this moment.  Patient will follow-up with me or primary care provider again.  Follow-up again in 4 weeks

## 2018-07-12 NOTE — Assessment & Plan Note (Signed)
Decision today to treat with OMT was based on Physical Exam  After verbal consent patient was treated with HVLA, ME, FPR techniques in cervical, thoracic, lumbar and sacral areas  Patient tolerated the procedure well with improvement in symptoms  Patient given exercises, stretches and lifestyle modifications  See medications in patient instructions if given  Patient will follow up in 4-6 weeks 

## 2018-07-12 NOTE — Assessment & Plan Note (Signed)
New problem to me.  Started the Effexor.  Discussed icing regimen.  We also discussed the hydroxyzine.  Follow-up again in 3 weeks.  Encourage patient to follow-up with primary care provider.

## 2018-07-14 ENCOUNTER — Encounter: Payer: Self-pay | Admitting: Family Medicine

## 2018-08-08 NOTE — Progress Notes (Signed)
[name] [place], [place] [address] Phone: [phone] Subjective:   Amanda [name], am serving as a scribe for Dr. Hulan Saas.   CC: low back pain.   PPI:[id number]  Amanda [name] is a 47 y.o. female coming in with complaint of left lower back and hip pain. Today she is having an increase in her pain which she        Past Medical History:  Diagnosis Date  . Anxiety   . Breast mass 04/2012   left breast  . Complication of anesthesia    states was hard to wake up post-op  . Depression   . Dry cough    since vocal cord surgery  . Sleep related teeth grinding    wears mouth guard at night   Past Surgical History:  Procedure Laterality Date  . AUGMENTATION MAMMAPLASTY Bilateral 2014  . BREAST MASS EXCISION Left 05/26/2012  . CERVIX SURGERY  2005   papilloma  . UMBILICAL HERNIA REPAIR  as a child   UHR  . vocal cord polyp excision  1999   Social History   Socioeconomic History  . Marital status: Married    Spouse name: Not on file  . Number of children: 1  . Years of education: Not on file  . Highest education level: Not on file  Occupational History  . Occupation: Spanish Professor  Social Needs  . Financial resource strain: Not on file  . Food insecurity:    Worry: Not on file    Inability: Not on file  . Transportation needs:    Medical: Not on file    Non-medical: Not on file  Tobacco Use  . Smoking status: Former Smoker    Years: 25.00    Types: Cigarettes  . Smokeless tobacco: Never Used  . Tobacco comment: smokes 3 cig./day  Substance and Sexual Activity  . Alcohol use: Yes    Alcohol/week: 0.0 standard drinks    Comment: occasional wine  . Drug use: [name]  . Sexual activity: Not on file  Lifestyle  . Physical activity:    Days per week: Not on file    Minutes per session: Not on file  . Stress: Not on file  Relationships  . Social connections:    Talks on phone: Not on file    Gets together:  Not on file    Attends religious service: Not on file    Active member of club or organization: Not on file    Attends meetings of clubs or organizations: Not on file    Relationship status: Not on file  Other Topics Concern  . Not on file  Social History Narrative  . Not on file   Allergies  Allergen Reactions  . Chloraprep One Step [Chlorhexidine Gluconate]   . Oxycodone Rash    Rash all over    Family History  Problem Relation Age of Onset  . Breast cancer Mother 52  . Brain cancer Maternal Uncle   . Leukemia Maternal Grandmother   . Diabetes Maternal Grandfather   . Depression Paternal Grandfather          Current Outpatient Medications (Other):  Marland Kitchen  buPROPion (ZYBAN) 150 MG 12 hr tablet, Take 150 mg by mouth 2 (two) times daily. .  Diclofenac Sodium 2 % SOLN, Place 2 g onto the skin 2 (two) times daily. Marland Kitchen  FLUoxetine (PROZAC) 20 MG capsule, Take 40 mg by mouth daily. .  hydrOXYzine (  ATARAX/VISTARIL) 10 MG tablet, Take 1 tablet (10 mg total) by mouth 3 (three) times daily as needed. .  hyoscyamine (LEVBID) 0.375 MG 12 hr tablet, Take 1 tablet (0.375 mg total) by mouth 2 (two) times daily. .  Milk Thistle 1000 MG CAPS, Take 1,000 mg by mouth daily. .  valACYclovir (VALTREX) 500 MG tablet, Take 1 tablet (500 mg total) by mouth 2 (two) times daily. Marland Kitchen  venlafaxine XR (EFFEXOR XR) 37.5 MG 24 hr capsule, Take 1 capsule (37.5 mg total) by mouth daily with breakfast. .  Vitamin D, Ergocalciferol, (DRISDOL) 50000 units CAPS capsule, TAKE 1 CAPSULE (50,000 UNITS TOTAL) BY MOUTH EVERY 7 (SEVEN) DAYS.    Past medical history, social, surgical and family history all reviewed in electronic medical record.  [name] pertanent information unless stated regarding to the chief complaint.   Review of Systems:  [name] headache, visual changes, nausea, vomiting, diarrhea, constipation, dizziness, abdominal pain, skin rash, fevers, chills, night sweats, weight loss, swollen lymph nodes, body aches,  joint swelling, muscle aches, chest pain, shortness of breath, mood changes.   Objective  Blood pressure 98/66, pulse (!) 105, height 5\' 3"  (1.6 m), weight 126 lb (57.2 kg), SpO2 98 %.   General: [name] apparent distress alert and oriented x3 mood and affect normal, dressed appropriately.  HEENT: Pupils equal, extraocular movements intact  Respiratory: Patient's speak in full sentences and does not appear short of breath  Cardiovascular: [name] lower extremity edema, non tender, [name] erythema  Skin: Warm dry intact with [name] signs of infection or rash on extremities or on axial skeleton.  Abdomen: Soft nontender  Neuro: Cranial nerves II through XII are intact, neurovascularly intact in all extremities with 2+ DTRs and 2+ pulses.  Lymph: [name] lymphadenopathy of posterior or anterior cervical chain or axillae bilaterally.  Gait normal with good balance and coordination.  MSK:  Non tender with full range of motion and good stability and symmetric strength and tone of shoulders, elbows, wrist, hip, knee and ankles bilaterally.  Back Exam:  Inspection: Unremarkable  Motion: Flexion 45 deg, Extension 25 deg, Side Bending to 35 deg bilaterally,  Rotation to 45 deg bilaterally  SLR laying: Negative  XSLR laying: Negative  Palpable tenderness: Tender to palpation. FABER: negative. Sensory change: Gross sensation intact to all lumbar and sacral dermatomes.  Reflexes: 2+ at both patellar tendons, 2+ at achilles tendons, Babinski's downgoing.  Strength at foot  Plantar-flexion: 5/5 Dorsi-flexion: 5/5 Eversion: 5/5 Inversion: 5/5  Leg strength  Quad: 5/5 Hamstring: 5/5 Hip flexor: 5/5 Hip abductors: 5/5  Gait unremarkable.   Osteopathic findings C2 flexed rotated and side bent right C6 flexed rotated and side bent left T3 extended rotated and side bent right inhaled third rib T9 extended rotated and side bent left L2 flexed rotated and side bent right Sacrum right on right    Impression and  Recommendations:     This case required medical decision making of moderate complexity. The above documentation has been reviewed and is accurate and complete [name], DO       Note: This dictation was prepared with Dragon dictation along with smaller phrase technology. Any transcriptional errors that result from this process are unintentional.

## 2018-08-09 ENCOUNTER — Ambulatory Visit: Payer: BC Managed Care – PPO | Admitting: Family Medicine

## 2018-08-09 ENCOUNTER — Ambulatory Visit: Payer: BC Managed Care – PPO | Admitting: Gastroenterology

## 2018-08-09 VITALS — BP 98/66 | HR 105 | Ht 63.0 in | Wt 126.0 lb

## 2018-08-09 DIAGNOSIS — M545 Low back pain, unspecified: Secondary | ICD-10-CM

## 2018-08-09 DIAGNOSIS — G8929 Other chronic pain: Secondary | ICD-10-CM | POA: Diagnosis not present

## 2018-08-09 DIAGNOSIS — M999 Biomechanical lesion, unspecified: Secondary | ICD-10-CM | POA: Diagnosis not present

## 2018-08-09 NOTE — Patient Instructions (Signed)
Good to see you  4-6 weeks

## 2018-08-09 NOTE — Assessment & Plan Note (Signed)
Multifactorial.  Discussed icing regimen of HEP, discussed topical  And which activities to do  RTC in 4 weeks

## 2018-08-09 NOTE — Assessment & Plan Note (Signed)
Decision today to treat with OMT was based on Physical Exam  After verbal consent patient was treated with HVLA, ME, FPR techniques in cervical, thoracic, lumbar and sacral areas  Patient tolerated the procedure well with improvement in symptoms  Patient given exercises, stretches and lifestyle modifications  See medications in patient instructions if given  Patient will follow up in 4 weeks 

## 2018-09-06 ENCOUNTER — Ambulatory Visit (INDEPENDENT_AMBULATORY_CARE_PROVIDER_SITE_OTHER): Payer: BC Managed Care – PPO

## 2018-09-06 ENCOUNTER — Encounter (HOSPITAL_COMMUNITY): Payer: Self-pay

## 2018-09-06 ENCOUNTER — Ambulatory Visit (HOSPITAL_COMMUNITY)
Admission: EM | Admit: 2018-09-06 | Discharge: 2018-09-06 | Disposition: A | Discharge status: Home / Self Care | Payer: BC Managed Care – PPO | Attending: Family Medicine | Admitting: Family Medicine

## 2018-09-06 DIAGNOSIS — F329 Major depressive disorder, single episode, unspecified: Secondary | ICD-10-CM | POA: Insufficient documentation

## 2018-09-06 DIAGNOSIS — Z79899 Other long term (current) drug therapy: Secondary | ICD-10-CM | POA: Insufficient documentation

## 2018-09-06 DIAGNOSIS — R1084 Generalized abdominal pain: Secondary | ICD-10-CM | POA: Insufficient documentation

## 2018-09-06 DIAGNOSIS — Z87891 Personal history of nicotine dependence: Secondary | ICD-10-CM | POA: Diagnosis not present

## 2018-09-06 DIAGNOSIS — K5900 Constipation, unspecified: Secondary | ICD-10-CM | POA: Diagnosis not present

## 2018-09-06 DIAGNOSIS — F411 Generalized anxiety disorder: Secondary | ICD-10-CM | POA: Insufficient documentation

## 2018-09-06 DIAGNOSIS — R109 Unspecified abdominal pain: Secondary | ICD-10-CM | POA: Diagnosis present

## 2018-09-06 LAB — POCT URINALYSIS DIP (DEVICE)
Bilirubin Urine: NEGATIVE
Glucose, UA: NEGATIVE mg/dL
Ketones, ur: NEGATIVE mg/dL
Nitrite: NEGATIVE
Protein, ur: NEGATIVE mg/dL
Specific Gravity, Urine: 1.02 (ref 1.005–1.030)
Urobilinogen, UA: 0.2 mg/dL (ref 0.0–1.0)
pH: 7 (ref 5.0–8.0)

## 2018-09-06 LAB — CBC
HCT: 35.8 % — ABNORMAL LOW (ref 36.0–46.0)
Hemoglobin: 11.7 g/dL — ABNORMAL LOW (ref 12.0–15.0)
MCH: 29.8 pg (ref 26.0–34.0)
MCHC: 32.7 g/dL (ref 30.0–36.0)
MCV: 91.1 fL (ref 80.0–100.0)
Platelets: 228 10*3/uL (ref 150–400)
RBC: 3.93 MIL/uL (ref 3.87–5.11)
RDW: 12.3 % (ref 11.5–15.5)
WBC: 6.2 10*3/uL (ref 4.0–10.5)
nRBC: 0 % (ref 0.0–0.2)

## 2018-09-06 LAB — POCT I-STAT, CHEM 8
BUN: 12 mg/dL (ref 6–20)
Calcium, Ion: 1.09 mmol/L — ABNORMAL LOW (ref 1.15–1.40)
Chloride: 106 mmol/L (ref 98–111)
Creatinine, Ser: 0.6 mg/dL (ref 0.44–1.00)
Glucose, Bld: 87 mg/dL (ref 70–99)
HCT: 34 % — ABNORMAL LOW (ref 36.0–46.0)
Hemoglobin: 11.6 g/dL — ABNORMAL LOW (ref 12.0–15.0)
Potassium: 3.9 mmol/L (ref 3.5–5.1)
Sodium: 138 mmol/L (ref 135–145)
TCO2: 23 mmol/L (ref 22–32)

## 2018-09-06 NOTE — Discharge Instructions (Signed)
Drink more water Eat a high fiber diet This helps prevent constipation Daily metamucil or miralax is needed for some individuals  When constipation severe, may need suppository ( bisacodyl or glycerin) or a fleets enema  If pain persists go to ER

## 2018-09-06 NOTE — ED Provider Notes (Signed)
[facility]    CSN: [id number] Arrival date & time: 09/06/18  0827     History   Chief Complaint Chief Complaint  Patient presents with  . Abdominal Pain    HPI Amanda [name] is a 47 y.o. female.   HPI Patient is here for lower abdominal pain.  Present for last 2 days.  Crampy abdominal pain.  Comes and goes.  Kept her awake last night.  Some nausea.  No vomiting.  Her last bowel movement was 5 or 6 days ago.  She does not normally have constipation but states she did have some years ago.  She is under a lot of stress.  She feels like she needs have a bowel movement but cannot.  She is tried a variety of over-the-counter products.  Nothing is helped. No fever or chills. No history of abdominal condition or IBS. No exposure to illness, travel, or recent antibiotic use No history of abdominal surgery Last menstrual period 3 weeks ago Past Medical History:  Diagnosis Date  . Anxiety   . Breast mass 04/2012   left breast  . Complication of anesthesia    states was hard to wake up post-op  . Depression   . Dry cough    since vocal cord surgery  . Sleep related teeth grinding    wears mouth guard at night    Patient Active Problem List   Diagnosis Date Noted  . Generalized anxiety disorder 07/12/2018  . Morton's neuroma of left foot 01/16/2016  . Low back pain 12/26/2015  . Nonallopathic lesion of lumbosacral region 12/26/2015  . Nonallopathic lesion of sacral region 12/26/2015  . Nonallopathic lesion of thoracic region 12/26/2015    Past Surgical History:  Procedure Laterality Date  . AUGMENTATION MAMMAPLASTY Bilateral 2014  . BREAST MASS EXCISION Left 05/26/2012  . CERVIX SURGERY  2005   papilloma  . UMBILICAL HERNIA REPAIR  as a child   UHR  . vocal cord polyp excision  1999    OB History   None      Home Medications    Prior to Admission medications   Medication Sig Start Date End Date Taking? Authorizing Provider  Diclofenac Sodium 2 %  SOLN Place 2 g onto the skin 2 (two) times daily. 10/26/17   [name], DO  FLUoxetine (PROZAC) 20 MG capsule Take 40 mg by mouth daily.    [provider]  hydrOXYzine (ATARAX/VISTARIL) 10 MG tablet Take 1 tablet (10 mg total) by mouth 3 (three) times daily as needed. 07/12/18   [name], DO  hyoscyamine (LEVBID) 0.375 MG 12 hr tablet Take 1 tablet (0.375 mg total) by mouth 2 (two) times daily. 02/22/18   Milus Banister, MD  Milk Thistle 1000 MG CAPS Take 1,000 mg by mouth daily.    [provider]  Vitamin D, Ergocalciferol, (DRISDOL) 50000 units CAPS capsule TAKE 1 CAPSULE (50,000 UNITS TOTAL) BY MOUTH EVERY 7 (SEVEN) DAYS. 06/06/18   [name], DO    Family History Family History  Problem Relation Age of Onset  . Breast cancer Mother 38  . Brain cancer Maternal Uncle   . Leukemia Maternal Grandmother   . Diabetes Maternal Grandfather   . Depression Paternal Grandfather     Social History Social History   Tobacco Use  . Smoking status: Former Smoker    Years: 25.00    Types: Cigarettes  . Smokeless tobacco: Never Used  . Tobacco comment: smokes  3 cig./day  Substance Use Topics  . Alcohol use: Yes    Alcohol/week: 0.0 standard drinks    Comment: occasional wine  . Drug use: No     Allergies   Chloraprep one step [chlorhexidine gluconate] and Oxycodone   Review of Systems Review of Systems   Physical Exam Triage Vital Signs ED Triage Vitals  Enc Vitals Group     BP 09/06/18 0847 108/71     Pulse Rate 09/06/18 0847 72     Resp 09/06/18 0847 16     Temp 09/06/18 0847 98.1 F (36.7 C)     Temp Source 09/06/18 0847 Oral     SpO2 09/06/18 0847 99 %     Weight --      Height --      Head Circumference --      Peak Flow --      Pain Score 09/06/18 0843 8     Pain Loc --      Pain Edu? --      Excl. in [place]? --    No data found.  Updated Vital Signs BP 108/71 (BP Location: Left Arm)   Pulse 72   Temp 98.1 F (36.7 C)  (Oral)   Resp 16   LMP 08/25/2018   SpO2 99%   Visual Acuity Right Eye Distance:   Left Eye Distance:   Bilateral Distance:    Right Eye Near:   Left Eye Near:    Bilateral Near:     Physical Exam   UC Treatments / Results  Labs (all labs ordered are listed, but only abnormal results are displayed) Labs Reviewed  POCT URINALYSIS DIP (DEVICE) - Abnormal; Notable for the following components:      Result Value   Hgb urine dipstick TRACE (*)    Leukocytes, UA TRACE (*)    All other components within normal limits  POCT I-STAT, CHEM 8 - Abnormal; Notable for the following components:   Calcium, Ion 1.09 (*)    Hemoglobin 11.6 (*)    HCT 34.0 (*)    All other components within normal limits  CBC  I-STAT CHEM 8, ED    EKG None  Radiology Dg Abdomen Acute W/chest  Result Date: 09/06/2018 CLINICAL DATA:  Bilateral lower quadrant abdominal pain associated with nausea for the past day. No bowel movement for the past 4-5 days. EXAM: DG ABDOMEN ACUTE W/ 1V CHEST COMPARISON:  None. FINDINGS: The lungs are well-expanded and clear. The heart and mediastinal structures are normal. There is no pleural effusion. There is gentle S shaped thoracolumbar curvature. Within the abdomen the colonic stool burden is increased. There is no evidence of a fecal impaction. No small or large bowel obstructive pattern is observed. There is a loop of mildly distended gas-filled bowel to the left of midline. There is a rounded calcification that projects in the right upper quadrant and may reflect a hepatic granuloma or gallstone. It appears to lie outside the confines of the right renal silhouette. IMPRESSION: Increased colonic stool burden compatible with constipation in the appropriate clinical setting. No acute intra-abdominal abnormality is observed. Possible approximately 6 mm diameter calcified gallstone or hepatic granuloma. Electronically Signed   By: David  Martinique M.D.   On: 09/06/2018 09:40     Procedures Procedures (including critical care time)  Medications Ordered in UC Medications - No data to display  Initial Impression / Assessment and Plan / UC Course  I have reviewed the triage vital signs and the  nursing notes.  Pertinent labs & imaging results that were available during my care of the patient were reviewed by me and considered in my medical decision making (see chart for details).     CBC still pending.  I will call them with results.  The abdominal films confirm my suspicion of constipation.  We discussed constipation in general.  Prevention.  Treatment. Final Clinical Impressions(s) / UC Diagnoses   Final diagnoses:  Constipation, unspecified constipation type  Generalized abdominal pain     Discharge Instructions     Drink more water Eat a high fiber diet This helps prevent constipation Daily metamucil or miralax is needed for some individuals  When constipation severe, may need suppository ( bisacodyl or glycerin) or a fleets enema  If pain persists go to ER   ED Prescriptions    None     Controlled Substance Prescriptions [place] Controlled Substance Registry consulted? Not Applicable   Raylene Everts, MD 09/06/18 1046

## 2018-09-06 NOTE — ED Triage Notes (Signed)
Pt present abdominal pain that started yesterday.  Pt states the pain is all over her stomach and she feels like she needs to go to the bathroom to have a bowel movement but is unsuccessful.

## 2018-09-12 NOTE — Progress Notes (Signed)
[name] [place], [place] [address] Phone: [phone] Subjective:   Amanda [name], am serving as a scribe for Dr. Hulan Saas.  CC: Back pain follow-up  ELF:[id number]  Amanda [name] is a 47 y.o. female coming in with complaint of back pain stating in left glute. Pain radiates down into the left foot. Pain has not worsened or improved since last visit.  More sadness.  Minimal discomfort overall.  Discussed restriction of exercise. Feels too much pressure at work.  In process of getting tenure      Past Medical History:  Diagnosis Date  . Anxiety   . Breast mass 04/2012   left breast  . Complication of anesthesia    states was hard to wake up post-op  . Depression   . Dry cough    since vocal cord surgery  . Sleep related teeth grinding    wears mouth guard at night   Past Surgical History:  Procedure Laterality Date  . AUGMENTATION MAMMAPLASTY Bilateral 2014  . BREAST MASS EXCISION Left 05/26/2012  . CERVIX SURGERY  2005   papilloma  . UMBILICAL HERNIA REPAIR  as a child   UHR  . vocal cord polyp excision  1999   Social History   Socioeconomic History  . Marital status: Married    Spouse name: Not on file  . Number of children: 1  . Years of education: Not on file  . Highest education level: Not on file  Occupational History  . Occupation: Spanish Professor  Social Needs  . Financial resource strain: Not on file  . Food insecurity:    Worry: Not on file    Inability: Not on file  . Transportation needs:    Medical: Not on file    Non-medical: Not on file  Tobacco Use  . Smoking status: Former Smoker    Years: 25.00    Types: Cigarettes  . Smokeless tobacco: Never Used  . Tobacco comment: smokes 3 cig./day  Substance and Sexual Activity  . Alcohol use: Yes    Alcohol/week: 0.0 standard drinks    Comment: occasional wine  . Drug use: [name]  . Sexual activity: Not on file  Lifestyle  . Physical activity:     Days per week: Not on file    Minutes per session: Not on file  . Stress: Not on file  Relationships  . Social connections:    Talks on phone: Not on file    Gets together: Not on file    Attends religious service: Not on file    Active member of club or organization: Not on file    Attends meetings of clubs or organizations: Not on file    Relationship status: Not on file  Other Topics Concern  . Not on file  Social History Narrative  . Not on file   Allergies  Allergen Reactions  . Chloraprep One Step [Chlorhexidine Gluconate]   . Oxycodone Rash    Rash all over    Family History  Problem Relation Age of Onset  . Breast cancer Mother 29  . Brain cancer Maternal Uncle   . Leukemia Maternal Grandmother   . Diabetes Maternal Grandfather   . Depression Paternal Grandfather          Current Outpatient Medications (Other):  Marland Kitchen  Diclofenac Sodium 2 % SOLN, Place 2 g onto the skin 2 (two) times daily. Marland Kitchen  FLUoxetine (PROZAC) 20 MG capsule, Take 40  mg by mouth daily. .  hyoscyamine (LEVBID) 0.375 MG 12 hr tablet, Take 1 tablet (0.375 mg total) by mouth 2 (two) times daily. .  Milk Thistle 1000 MG CAPS, Take 1,000 mg by mouth daily. .  Vitamin D, Ergocalciferol, (DRISDOL) 50000 units CAPS capsule, TAKE 1 CAPSULE (50,000 UNITS TOTAL) BY MOUTH EVERY 7 (SEVEN) DAYS. .  hydrOXYzine (ATARAX/VISTARIL) 10 MG tablet, Take 1 tablet (10 mg total) by mouth 3 (three) times daily as needed.    Past medical history, social, surgical and family history all reviewed in electronic medical record.  [name] pertanent information unless stated regarding to the chief complaint.   Review of Systems:  [name] headache, visual changes, nausea, vomiting, diarrhea, constipation, dizziness, abdominal pain, skin rash, fevers, chills, night sweats, weight loss, swollen lymph nodes, body aches, joint swelling, chest pain, shortness of breath, mood changes.  Positive muscle aches  Objective  Blood pressure  102/76, pulse (!) 108, height 5\' 3"  (1.6 m), weight 124 lb (56.2 kg), last menstrual period 08/25/2018, SpO2 98 %.  General: [name] apparent distress alert and oriented x3 mood and affect normal, dressed appropriately.  Very tearful HEENT: Pupils equal, extraocular movements intact  Respiratory: Patient's speak in full sentences and does not appear short of breath  Cardiovascular: [name] lower extremity edema, non tender, [name] erythema  Skin: Warm dry intact with [name] signs of infection or rash on extremities or on axial skeleton.  Abdomen: Soft nontender  Neuro: Cranial nerves II through XII are intact, neurovascularly intact in all extremities with 2+ DTRs and 2+ pulses.  Lymph: [name] lymphadenopathy of posterior or anterior cervical chain or axillae bilaterally.  Gait normal with good balance and coordination.  MSK:  Non tender with full range of motion and good stability and symmetric strength and tone of shoulders, elbows, wrist, hip, knee and ankles bilaterally.  Back Exam:  Inspection: Unremarkable  Motion: Flexion 40 deg, Extension 20 deg, Side Bending to 35 deg bilaterally,  Rotation to 35 deg bilaterally  SLR laying: Negative  XSLR laying: Negative  Palpable tenderness: Tender to palpation the paraspinal musculature.Marland Kitchen FABER: Positive bilaterally left greater than right. Sensory change: Gross sensation intact to all lumbar and sacral dermatomes.  Reflexes: 2+ at both patellar tendons, 2+ at achilles tendons, Babinski's downgoing.  Strength at foot  Plantar-flexion: 5/5 Dorsi-flexion: 5/5 Eversion: 5/5 Inversion: 5/5  Leg strength  Quad: 5/5 Hamstring: 5/5 Hip flexor: 5/5 Hip abductors: 5/5    Osteopathic findings C2 flexed rotated and side bent right C7 flexed rotated and side bent left T3 extended rotated and side bent right inhaled third rib T5 extended rotated and side bent left L2 flexed rotated and side bent right Sacrum right on right    Impression and Recommendations:     This  case required medical decision making of moderate complexity. The above documentation has been reviewed and is accurate and complete Amanda Pulley, DO       Note: This dictation was prepared with Dragon dictation along with smaller phrase technology. Any transcriptional errors that result from this process are unintentional.

## 2018-09-13 ENCOUNTER — Ambulatory Visit: Payer: BC Managed Care – PPO | Admitting: Family Medicine

## 2018-09-13 ENCOUNTER — Other Ambulatory Visit: Payer: Self-pay

## 2018-09-13 ENCOUNTER — Encounter: Payer: Self-pay | Admitting: Family Medicine

## 2018-09-13 VITALS — BP 102/76 | HR 108 | Ht 63.0 in | Wt 124.0 lb

## 2018-09-13 DIAGNOSIS — M999 Biomechanical lesion, unspecified: Secondary | ICD-10-CM

## 2018-09-13 DIAGNOSIS — F411 Generalized anxiety disorder: Secondary | ICD-10-CM

## 2018-09-13 DIAGNOSIS — M545 Low back pain, unspecified: Secondary | ICD-10-CM

## 2018-09-13 MED ORDER — HYDROXYZINE HCL 10 MG PO TABS: 10.0000 mg | ORAL_TABLET | Freq: Three times a day (TID) | ORAL | 0 refills | Status: DC | PRN

## 2018-09-13 NOTE — Assessment & Plan Note (Addendum)
Decision today to treat with OMT was based on Physical Exam  After verbal consent patient was treated with HVLA, ME, FPR techniques in thoraciclumbar and sacral areas  Patient tolerated the procedure well with improvement in symptoms  Patient given exercises, stretches and lifestyle modifications  See medications in patient instructions if given  Patient will follow up in 4-6 weeks

## 2018-09-13 NOTE — Patient Instructions (Addendum)
Good to see you  You are wonderful  You need to take time for yourself.  Need to see you again in 3-4 weeks but write and tell me how you are doing  PLEASE call if you need anything

## 2018-09-13 NOTE — Assessment & Plan Note (Signed)
Worsening generalized anxiety disorder and concerned that patient does have some underlying depression.  Patient given a different phone number to call if any worsening symptoms.  We discussed action plan with patient.  Discussed icing regimen and home exercises again with her other modalities.  We discussed how she needs to consider

## 2018-10-02 ENCOUNTER — Other Ambulatory Visit: Payer: Self-pay | Admitting: Family Medicine

## 2018-10-09 ENCOUNTER — Encounter: Payer: Self-pay | Admitting: Family Medicine

## 2018-10-10 NOTE — Progress Notes (Signed)
[name] Amanda [name], [place] [address] Phone: [phone] Subjective:   [name], am serving as a scribe for Dr. Hulan [name].   CC: Right sided wrist pain, back pain  [id number]  Amanda [name] is a 48 y.o. female coming in with complaint of back pain on right side that increased on 09/21/2018. Did see a chiropractor for adjustments which helped with pain intensity. Does have dull radiating pain. States that pain was there for a few weeks before the intensity increased on 09/21/2018.  Known history of carpal tunnel.  Responded to an injection a year ago.       Past Medical History:  Diagnosis Date  . Anxiety   . Breast mass 04/2012   left breast  . Complication of anesthesia    states was hard to wake up post-op  . Depression   . Dry cough    since vocal cord surgery  . Sleep related teeth grinding    wears mouth guard at night   Past Surgical History:  Procedure Laterality Date  . AUGMENTATION MAMMAPLASTY Bilateral 2014  . BREAST MASS EXCISION Left 05/26/2012  . CERVIX SURGERY  2005   papilloma  . UMBILICAL HERNIA REPAIR  as a child   UHR  . vocal cord polyp excision  1999   Social History   Socioeconomic History  . Marital status: Married    Spouse name: Not on file  . Number of children: 1  . Years of education: Not on file  . Highest education level: Not on file  Occupational History  . Occupation: Spanish Professor  Social Needs  . Financial resource strain: Not on file  . Food insecurity:    Worry: Not on file    Inability: Not on file  . Transportation needs:    Medical: Not on file    Non-medical: Not on file  Tobacco Use  . Smoking status: Former Smoker    Years: 25.00    Types: Cigarettes  . Smokeless tobacco: Never Used  . Tobacco comment: smokes 3 cig./day  Substance and Sexual Activity  . Alcohol use: Yes    Alcohol/week: 0.0 standard drinks    Comment: occasional wine  . Drug use:  No  . Sexual activity: Not on file  Lifestyle  . Physical activity:    Days per week: Not on file    Minutes per session: Not on file  . Stress: Not on file  Relationships  . Social connections:    Talks on phone: Not on file    Gets together: Not on file    Attends religious service: Not on file    Active member of club or organization: Not on file    Attends meetings of clubs or organizations: Not on file    Relationship status: Not on file  Other Topics Concern  . Not on file  Social History Narrative  . Not on file   Allergies  Allergen Reactions  . Chloraprep One Step [Chlorhexidine Gluconate]   . Oxycodone Rash    Rash all over    Family History  Problem Relation Age of Onset  . Breast cancer Mother 33  . Brain cancer Maternal Uncle   . Leukemia Maternal Grandmother   . Diabetes Maternal Grandfather   . Depression Paternal Grandfather          Current Outpatient Medications (Other):  Marland Kitchen  Diclofenac Sodium 2 % SOLN, Place 2 g onto the skin  2 (two) times daily. Marland Kitchen  FLUoxetine (PROZAC) 20 MG capsule, Take 40 mg by mouth daily. .  hydrOXYzine (ATARAX/VISTARIL) 10 MG tablet, Take 1 tablet (10 mg total) by mouth 3 (three) times daily as needed. .  hyoscyamine (LEVBID) 0.375 MG 12 hr tablet, Take 1 tablet (0.375 mg total) by mouth 2 (two) times daily. .  Milk Thistle 1000 MG CAPS, Take 1,000 mg by mouth daily. .  Vitamin D, Ergocalciferol, (DRISDOL) 1.25 MG (50000 UT) CAPS capsule, TAKE 1 CAPSULE (50,000 UNITS TOTAL) BY MOUTH EVERY 7 (SEVEN) DAYS.    Past medical history, social, surgical and family history all reviewed in electronic medical record.  No pertanent information unless stated regarding to the chief complaint.   Review of Systems:  No headache, visual changes, nausea, vomiting, diarrhea, constipation, dizziness, abdominal pain, skin rash, fevers, chills, night sweats, weight loss, swollen lymph nodes, body aches, joint swelling, , chest pain, shortness of  breath, mood changes.   Objective  Blood pressure 118/80, pulse 82, height 5\' 3"  (1.6 m), weight 125 lb (56.7 kg), SpO2 98 %.    General: No apparent distress alert and oriented x3 mood and affect normal, dressed appropriately.  HEENT: Pupils equal, extraocular movements intact  Respiratory: Patient's speak in full sentences and does not appear short of breath  Cardiovascular: No lower extremity edema, non tender, no erythema  Skin: Warm dry intact with no signs of infection or rash on extremities or on axial skeleton.  Abdomen: Soft nontender  Neuro: Cranial nerves II through XII are intact, neurovascularly intact in all extremities with 2+ DTRs and 2+ pulses.  Lymph: No lymphadenopathy of posterior or anterior cervical chain or axillae bilaterally.  Gait normal with good balance and coordination.  MSK:  Non tender with full range of motion and good stability and symmetric strength and tone of shoulders, elbows, , hip, knee and ankles bilaterally.   Wrist: Right Inspection normal with no visible erythema or swelling. ROM smooth and normal with good flexion and extension and ulnar/radial deviation that is symmetrical with opposite wrist. Palpation is normal over metacarpals, navicular, lunate, and TFCC; tendons without tenderness/ swelling No snuffbox tenderness. No tenderness over Canal of Guyon. Strength 4/5 in all directions without pain. Negative Finkelstein, positive  tinel's and phalens. Negative Watson's test.  Back Exam:  Inspection: Loss of lordosis Motion: Flexion 40 deg, Extension 25 deg, Side Bending to 45 deg bilaterally,  Rotation to 25 deg bilaterally  SLR laying: Mild positive left XSLR laying: Negative  Palpable tenderness: Tender to palpation the paraspinal musculature. FABER: Positive left. Sensory change: Gross sensation intact to all lumbar and sacral dermatomes.  Reflexes: 2+ at both patellar tendons, 2+ at achilles tendons, Babinski's downgoing.  Strength  at foot  Plantar-flexion: 5/5 Dorsi-flexion: 5/5 Eversion: 5/5 Inversion: 5/5  Leg strength  Quad: 5/5 Hamstring: 5/5 Hip flexor: 5/5 Hip abductors: 5/5  Gait unremarkable.   Procedure: Real-time Ultrasound Guided Injection of right carpal tunnel Device: GE Logiq Q7  Ultrasound guided injection is preferred based studies that show increased duration, increased effect, greater accuracy, decreased procedural pain, increased response rate with ultrasound guided versus blind injection.  Verbal informed consent obtained.  Time-out conducted.  Noted no overlying erythema, induration, or other signs of local infection.  Skin prepped in a sterile fashion.  Local anesthesia: Topical Ethyl chloride.  With sterile technique and under real time ultrasound guidance:  median nerve visualized.  23g 5/8 inch needle inserted distal to proximal approach into nerve sheath.  Pictures taken nfor needle placement. Patient did have injection of 2 cc of 1% lidocaine, 1 cc of 0.5% Marcaine, and 1 cc of Kenalog 40 mg/dL. Completed without difficulty  Pain immediately resolved suggesting accurate placement of the medication.  Advised to call if fevers/chills, erythema, induration, drainage, or persistent bleeding.  Images permanently stored and available for review in the ultrasound unit.  Impression: Technically successful ultrasound guided injection.  Impression and Recommendations:     This case required medical decision making of moderate complexity. The above documentation has been reviewed and is accurate and complete [name], DO       Note: This dictation was prepared with Dragon dictation along with smaller phrase technology. Any transcriptional errors that result from this process are unintentional.

## 2018-10-11 ENCOUNTER — Ambulatory Visit: Payer: Self-pay

## 2018-10-11 ENCOUNTER — Ambulatory Visit (INDEPENDENT_AMBULATORY_CARE_PROVIDER_SITE_OTHER)
Admission: RE | Admit: 2018-10-11 | Discharge: 2018-10-11 | Disposition: A | Discharge status: Home / Self Care | Payer: BC Managed Care – PPO | Source: Ambulatory Visit | Attending: Family Medicine | Admitting: Family Medicine

## 2018-10-11 ENCOUNTER — Encounter: Payer: Self-pay | Admitting: Family Medicine

## 2018-10-11 ENCOUNTER — Ambulatory Visit: Payer: BC Managed Care – PPO | Admitting: Family Medicine

## 2018-10-11 VITALS — BP 118/80 | HR 82 | Ht 63.0 in | Wt 125.0 lb

## 2018-10-11 DIAGNOSIS — M545 Low back pain, unspecified: Secondary | ICD-10-CM

## 2018-10-11 DIAGNOSIS — G5601 Carpal tunnel syndrome, right upper limb: Secondary | ICD-10-CM | POA: Insufficient documentation

## 2018-10-11 DIAGNOSIS — M25531 Pain in right wrist: Secondary | ICD-10-CM

## 2018-10-11 DIAGNOSIS — G8929 Other chronic pain: Secondary | ICD-10-CM

## 2018-10-11 NOTE — Patient Instructions (Signed)
3ish weeks  xrays downstairs

## 2018-10-11 NOTE — Assessment & Plan Note (Signed)
Due to time constraints on patient we did hold on any type of back evaluation today

## 2018-10-11 NOTE — Assessment & Plan Note (Signed)
Patient given injection today.  Tolerated the procedure well.  Discussed icing regimen and home exercise.  Discussed topical anti-inflammatories.  Discussed avoiding repetitive flexion when possible.  Follow-up with me again in 4 weeks

## 2018-10-31 ENCOUNTER — Encounter: Payer: Self-pay | Admitting: Family Medicine

## 2018-10-31 NOTE — Progress Notes (Signed)
[name] [address] [place], [place] [address] Phone: [phone] Subjective:   Amanda [name], am serving as a scribe for Dr. Hulan Saas.  CC: Back pain follow-up  [id number]     Update 11/01/2018: Amanda [name] is a 48 y.o. female coming in with complaint of back and wrist pain. Patient has had success in pain management with OMT. Patient states that her wrist pain diminished with the injection given last visit.   Patient is also here for back pain. She said that she uses OMT and chiropractic care to manage her back pain.  Overall doing relatively well.  Has had some increasing stress recently.  Patient is having difficulty with her marriage at the moment.  Patient is also having difficulty with her job.  Does not feel happy overall but denies any suicidal or homicidal ideation.  Is like she is in a safe spot.   X-rays taken on October 11, 2018.  Patient's x-rays were independently visualized by me showing mild degenerative changes noted.  Past Medical History:  Diagnosis Date  . Anxiety   . Breast mass 04/2012   left breast  . Complication of anesthesia    states was hard to wake up post-op  . Depression   . Dry cough    since vocal cord surgery  . Sleep related teeth grinding    wears mouth guard at night   Past Surgical History:  Procedure Laterality Date  . AUGMENTATION MAMMAPLASTY Bilateral 2014  . BREAST MASS EXCISION Left 05/26/2012  . CERVIX SURGERY  2005   papilloma  . UMBILICAL HERNIA REPAIR  as a child   UHR  . vocal cord polyp excision  1999   Social History   Socioeconomic History  . Marital status: Married    Spouse name: Not on file  . Number of children: 1  . Years of education: Not on file  . Highest education level: Not on file  Occupational History  . Occupation: Spanish Professor  Social Needs  . Financial resource strain: Not on file  . Food insecurity:    Worry: Not on file    Inability: Not on  file  . Transportation needs:    Medical: Not on file    Non-medical: Not on file  Tobacco Use  . Smoking status: Former Smoker    Years: 25.00    Types: Cigarettes  . Smokeless tobacco: Never Used  . Tobacco comment: smokes 3 cig./day  Substance and Sexual Activity  . Alcohol use: Yes    Alcohol/week: 0.0 standard drinks    Comment: occasional wine  . Drug use: [name]  . Sexual activity: Not on file  Lifestyle  . Physical activity:    Days per week: Not on file    Minutes per session: Not on file  . Stress: Not on file  Relationships  . Social connections:    Talks on phone: Not on file    Gets together: Not on file    Attends religious service: Not on file    Active member of club or organization: Not on file    Attends meetings of clubs or organizations: Not on file    Relationship status: Not on file  Other Topics Concern  . Not on file  Social History Narrative  . Not on file   Allergies  Allergen Reactions  . Chloraprep One Step [Chlorhexidine Gluconate]   . Oxycodone Rash    Rash all over  Family History  Problem Relation Age of Onset  . Breast cancer Mother 6  . Brain cancer Maternal Uncle   . Leukemia Maternal Grandmother   . Diabetes Maternal Grandfather   . Depression Paternal Grandfather          Current Outpatient Medications (Other):  Marland Kitchen  Diclofenac Sodium 2 % SOLN, Place 2 g onto the skin 2 (two) times daily. Marland Kitchen  FLUoxetine (PROZAC) 20 MG capsule, Take 40 mg by mouth daily. .  hydrOXYzine (ATARAX/VISTARIL) 10 MG tablet, Take 1 tablet (10 mg total) by mouth 3 (three) times daily as needed. .  hyoscyamine (LEVBID) 0.375 MG 12 hr tablet, Take 1 tablet (0.375 mg total) by mouth 2 (two) times daily. .  Milk Thistle 1000 MG CAPS, Take 1,000 mg by mouth daily. .  Vitamin D, Ergocalciferol, (DRISDOL) 1.25 MG (50000 UT) CAPS capsule, TAKE 1 CAPSULE (50,000 UNITS TOTAL) BY MOUTH EVERY 7 (SEVEN) DAYS. .  cephALEXin (KEFLEX) 500 MG capsule, Take 1 capsule  (500 mg total) by mouth 2 (two) times daily.    Past medical history, social, surgical and family history all reviewed in electronic medical record.  [name] pertanent information unless stated regarding to the chief complaint.   Review of Systems:  [name] headache, visual changes, nausea, vomiting, diarrhea, constipation, dizziness, abdominal pain, skin rash, fevers, chills, night sweats, weight loss, swollen lymph nodes, body aches, joint swelling,  chest pain, shortness of breath, mood changes.  Positive muscle aches  Objective  Blood pressure 102/68, pulse 87, height 5\' 3"  (1.6 m), weight 125 lb (56.7 kg), SpO2 96 %.   General: [name] apparent distress alert and oriented x3 mood and affect normal, dressed appropriately.  HEENT: Pupils equal, extraocular movements intact  Respiratory: Patient's speak in full sentences and does not appear short of breath  Cardiovascular: [name] lower extremity edema, non tender, [name] erythema  Skin: Warm dry intact with [name] signs of infection or rash on extremities or on axial skeleton.  Abdomen: Soft nontender  Neuro: Cranial nerves II through XII are intact, neurovascularly intact in all extremities with 2+ DTRs and 2+ pulses.  Lymph: [name] lymphadenopathy of posterior or anterior cervical chain or axillae bilaterally.  Gait normal with good balance and coordination.  MSK:  Non tender with full range of motion and good stability and symmetric strength and tone of shoulders, elbows, wrist, hip, knee and ankles bilaterally.  Back Exam:  Inspection: Mild loss of lordosis Motion: Flexion 45 deg, Extension 25 deg, Side Bending to 35 deg bilaterally,  Rotation to 35 deg bilaterally  SLR laying: Negative  XSLR laying: Negative  Palpable tenderness: Tender to palpation in the paraspinal musculature lumbar spine right greater than left mild tightness with Corky Sox on the right. Sensory change: Gross sensation intact to all lumbar and sacral dermatomes.  Reflexes: 2+ at both patellar  tendons, 2+ at achilles tendons, Babinski's downgoing.  Strength at foot  Plantar-flexion: 5/5 Dorsi-flexion: 5/5 Eversion: 5/5 Inversion: 5/5  Leg strength  Quad: 5/5 Hamstring: 5/5 Hip flexor: 5/5 Hip abductors: 5/5  Gait unremarkable.  Osteopathic findings C7 flexed rotated and side bent left T3 extended rotated and side bent right inhaled third rib T6 extended rotated and side bent left L2 flexed rotated and side bent right Sacrum right on right     Impression and Recommendations:     This case required medical decision making of moderate complexity. The above documentation has been reviewed and is accurate and complete [name], DO  Note: This dictation was prepared with Dragon dictation along with smaller phrase technology. Any transcriptional errors that result from this process are unintentional.

## 2018-11-01 ENCOUNTER — Ambulatory Visit: Payer: BC Managed Care – PPO | Admitting: Family Medicine

## 2018-11-01 ENCOUNTER — Encounter: Payer: Self-pay | Admitting: Family Medicine

## 2018-11-01 VITALS — BP 102/68 | HR 87 | Ht 63.0 in | Wt 125.0 lb

## 2018-11-01 DIAGNOSIS — M999 Biomechanical lesion, unspecified: Secondary | ICD-10-CM | POA: Diagnosis not present

## 2018-11-01 DIAGNOSIS — M545 Low back pain, unspecified: Secondary | ICD-10-CM

## 2018-11-01 DIAGNOSIS — G8929 Other chronic pain: Secondary | ICD-10-CM

## 2018-11-01 MED ORDER — CEPHALEXIN 500 MG PO CAPS: 500.0000 mg | ORAL_CAPSULE | Freq: Two times a day (BID) | ORAL | 0 refills | Status: DC

## 2018-11-01 NOTE — Patient Instructions (Signed)
Good to see you  Ice is your friend I really think effexor could be good  See me again in 4-6 weeks but please call me if you need anything

## 2018-11-01 NOTE — Assessment & Plan Note (Signed)
Decision today to treat with OMT was based on Physical Exam  After verbal consent patient was treated with HVLA, ME, FPR techniques in cervical, thoracic, rib lumbar and sacral areas  Patient tolerated the procedure well with improvement in symptoms  Patient given exercises, stretches and lifestyle modifications  See medications in patient instructions if given  Patient will follow up in 4-8 weeks 

## 2018-11-01 NOTE — Assessment & Plan Note (Signed)
Patient does have generalized anxiety that is making the back pain worse.  I believe that stress is causing some difficulty.  Patient did not want to be referred to talk to anyone and is looking for a new psychiatrist.  Discussed once weekly vitamin D, topical anti-inflammatories.  Keflex given secondary to recurrent UTI she states.  Follow-up again in 4 to 8 weeks

## 2018-11-03 ENCOUNTER — Encounter: Payer: Self-pay | Admitting: Family Medicine

## 2018-11-29 ENCOUNTER — Ambulatory Visit: Payer: BC Managed Care – PPO | Admitting: Family Medicine

## 2018-12-14 ENCOUNTER — Encounter: Payer: Self-pay | Admitting: Family Medicine

## 2018-12-15 ENCOUNTER — Ambulatory Visit: Payer: BC Managed Care – PPO | Admitting: Family Medicine

## 2018-12-22 ENCOUNTER — Encounter: Payer: Self-pay | Admitting: Family Medicine

## 2018-12-24 MED ORDER — DULOXETINE HCL 20 MG PO CPEP: 20.0000 mg | ORAL_CAPSULE | Freq: Every day | ORAL | 3 refills | Status: DC

## 2018-12-24 MED ORDER — VITAMIN D (ERGOCALCIFEROL) 1.25 MG (50000 UNIT) PO CAPS: 50000.0000 [IU] | ORAL_CAPSULE | ORAL | 0 refills | Status: DC

## 2019-01-03 ENCOUNTER — Ambulatory Visit: Payer: BC Managed Care – PPO | Admitting: Family Medicine

## 2019-01-03 ENCOUNTER — Ambulatory Visit: Payer: Self-pay

## 2019-01-03 ENCOUNTER — Other Ambulatory Visit: Payer: Self-pay

## 2019-01-03 ENCOUNTER — Encounter: Payer: Self-pay | Admitting: Family Medicine

## 2019-01-03 VITALS — BP 100/70 | HR 89 | Ht 63.0 in | Wt 126.0 lb

## 2019-01-03 DIAGNOSIS — G5601 Carpal tunnel syndrome, right upper limb: Secondary | ICD-10-CM | POA: Diagnosis not present

## 2019-01-03 DIAGNOSIS — M25531 Pain in right wrist: Secondary | ICD-10-CM

## 2019-01-03 NOTE — Assessment & Plan Note (Signed)
Injected today.  Discussed icing regimen and home exercise.  Which activities to do which wants to avoid.  Increase activity as tolerated.  Follow-up again 3 months.  An attempt patient to avoid until coronavirus outbreak is done

## 2019-01-03 NOTE — Patient Instructions (Signed)
Good to see you  Ice is your friend    

## 2019-01-03 NOTE — Progress Notes (Signed)
[name] [place], [place] [address] Phone: [phone] Subjective:   [name], am serving as a scribe for Dr. Hulan Saas.  I'm seeing this patient by the request  of:    CC: Right wrist pain  [id number]  Amanda [name] is a 48 y.o. female coming in with complaint of right wrist pain. Patient states that her wrist is painful and she would like an injection.  Patient has been diagnosed with carpal tunnel previously.  Discussed with patient in great length.  Patient has been trying to be active.  Patient states that at nighttime it is waking her up at night.  Rates the severity of pain is 8 out of 10.  States that the numbness seems to be more chronic as well.     Past Medical History:  Diagnosis Date  . Anxiety   . Breast mass 04/2012   left breast  . Complication of anesthesia    states was hard to wake up post-op  . Depression   . Dry cough    since vocal cord surgery  . Sleep related teeth grinding    wears mouth guard at night   Past Surgical History:  Procedure Laterality Date  . AUGMENTATION MAMMAPLASTY Bilateral 2014  . BREAST MASS EXCISION Left 05/26/2012  . CERVIX SURGERY  2005   papilloma  . UMBILICAL HERNIA REPAIR  as a child   UHR  . vocal cord polyp excision  1999   Social History   Socioeconomic History  . Marital status: Married    Spouse name: Not on file  . Number of children: 1  . Years of education: Not on file  . Highest education level: Not on file  Occupational History  . Occupation: Spanish Professor  Social Needs  . Financial resource strain: Not on file  . Food insecurity:    Worry: Not on file    Inability: Not on file  . Transportation needs:    Medical: Not on file    Non-medical: Not on file  Tobacco Use  . Smoking status: Former Smoker    Years: 25.00    Types: Cigarettes  . Smokeless tobacco: Never Used  . Tobacco comment: smokes 3 cig./day  Substance and Sexual  Activity  . Alcohol use: Yes    Alcohol/week: 0.0 standard drinks    Comment: occasional wine  . Drug use: No  . Sexual activity: Not on file  Lifestyle  . Physical activity:    Days per week: Not on file    Minutes per session: Not on file  . Stress: Not on file  Relationships  . Social connections:    Talks on phone: Not on file    Gets together: Not on file    Attends religious service: Not on file    Active member of club or organization: Not on file    Attends meetings of clubs or organizations: Not on file    Relationship status: Not on file  Other Topics Concern  . Not on file  Social History Narrative  . Not on file   Allergies  Allergen Reactions  . Chloraprep One Step [Chlorhexidine Gluconate]   . Oxycodone Rash    Rash all over    Family History  Problem Relation Age of Onset  . Breast cancer Mother 73  . Brain cancer Maternal Uncle   . Leukemia Maternal Grandmother   . Diabetes Maternal Grandfather   . Depression Paternal  Grandfather          Current Outpatient Medications (Other):  .  cephALEXin (KEFLEX) 500 MG capsule, Take 1 capsule (500 mg total) by mouth 2 (two) times daily. .  Diclofenac Sodium 2 % SOLN, Place 2 g onto the skin 2 (two) times daily. .  DULoxetine (CYMBALTA) 20 MG capsule, Take 1 capsule (20 mg total) by mouth daily. Marland Kitchen  FLUoxetine (PROZAC) 20 MG capsule, Take 40 mg by mouth daily. .  hydrOXYzine (ATARAX/VISTARIL) 10 MG tablet, Take 1 tablet (10 mg total) by mouth 3 (three) times daily as needed. .  hyoscyamine (LEVBID) 0.375 MG 12 hr tablet, Take 1 tablet (0.375 mg total) by mouth 2 (two) times daily. .  Milk Thistle 1000 MG CAPS, Take 1,000 mg by mouth daily. .  Vitamin D, Ergocalciferol, (DRISDOL) 1.25 MG (50000 UT) CAPS capsule, Take 1 capsule (50,000 Units total) by mouth every 7 (seven) days.    Past medical history, social, surgical and family history all reviewed in electronic medical record.  No pertanent information  unless stated regarding to the chief complaint.   Review of Systems:  No headache, visual changes, nausea, vomiting, diarrhea, constipation, dizziness, abdominal pain, skin rash, fevers, chills, night sweats, weight loss, swollen lymph nodes, body aches, joint swelling, chest pain, shortness of breath, mood changes.  Positive numbness in the hands, mild muscle aches  Objective  Blood pressure 100/70, pulse 89, height 5\' 3"  (1.6 m), weight 126 lb (57.2 kg), SpO2 98 %.   General: No apparent distress alert and oriented x3 mood and affect normal, dressed appropriately.  HEENT: Pupils equal, extraocular movements intact  Respiratory: Patient's speak in full sentences and does not appear short of breath  Cardiovascular: No lower extremity edema, non tender, no erythema  Skin: Warm dry intact with no signs of infection or rash on extremities or on axial skeleton.  Abdomen: Soft nontender  Neuro: Cranial nerves II through XII are intact, neurovascularly intact in all extremities with 2+ DTRs and 2+ pulses.  Lymph: No lymphadenopathy of posterior or anterior cervical chain or axillae bilaterally.  Gait normal with good balance and coordination.  MSK:  Non tender with full range of motion and good stability and symmetric strength and tone of shoulders, elbows,  hip, knee and ankles bilaterally.  Wrist: Right Inspection normal with no visible erythema or swelling. ROM smooth and normal with good flexion and extension and ulnar/radial deviation that is symmetrical with opposite wrist. Palpation is normal over metacarpals, navicular, lunate, and TFCC; tendons without tenderness/ swelling No snuffbox tenderness. No tenderness over Canal of Guyon. Strength 5/5 in all directions without pain. Negative Finkelstein, positive Tinel's and phalens. Negative Watson's test. Contralateral wrist unremarkable  Procedure: Real-time Ultrasound Guided Injection of right carpal tunnel Device: GE Logiq Q7   Ultrasound guided injection is preferred based studies that show increased duration, increased effect, greater accuracy, decreased procedural pain, increased response rate with ultrasound guided versus blind injection.  Verbal informed consent obtained.  Time-out conducted.  Noted no overlying erythema, induration, or other signs of local infection.  Skin prepped in a sterile fashion.  Local anesthesia: Topical Ethyl chloride.  With sterile technique and under real time ultrasound guidance:  median nerve visualized.  23g 5/8 inch needle inserted distal to proximal approach into nerve sheath. Pictures taken nfor needle placement. Patient did have injection of 2 cc of 1% lidocaine, 1 cc of 0.5% Marcaine, and 1 cc of Kenalog 40 mg/dL. Completed without difficulty  Pain immediately  resolved suggesting accurate placement of the medication.  Advised to call if fevers/chills, erythema, induration, drainage, or persistent bleeding.  Images permanently stored and available for review in the ultrasound unit.  Impression: Technically successful ultrasound guided injection.     Impression and Recommendations:     This case required medical decision making of moderate complexity. The above documentation has been reviewed and is accurate and complete [name], DO       Note: This dictation was prepared with Dragon dictation along with smaller phrase technology. Any transcriptional errors that result from this process are unintentional.

## 2019-01-12 ENCOUNTER — Ambulatory Visit: Payer: BC Managed Care – PPO | Admitting: Family Medicine

## 2019-02-01 ENCOUNTER — Ambulatory Visit: Payer: BC Managed Care – PPO | Admitting: Family Medicine

## 2019-02-10 ENCOUNTER — Encounter: Payer: Self-pay | Admitting: Family Medicine

## 2019-02-10 ENCOUNTER — Other Ambulatory Visit: Payer: Self-pay | Admitting: Gastroenterology

## 2019-02-10 MED ORDER — HYOSCYAMINE SULFATE ER 0.375 MG PO TB12: 0.3750 mg | ORAL_TABLET | Freq: Two times a day (BID) | ORAL | 0 refills | Status: DC

## 2019-02-10 NOTE — Telephone Encounter (Signed)
Prescription sent to patient's pharmacy.

## 2019-02-14 ENCOUNTER — Ambulatory Visit: Payer: BC Managed Care – PPO | Admitting: Family Medicine

## 2019-02-21 NOTE — Progress Notes (Signed)
[name] [address] [place], [place] [address] Phone: [phone] Subjective:   Amanda Amanda [name], am serving as a scribe for Dr. Hulan Saas.  Back pain follow-up  CC: Back pain  Amanda Amanda [name]  Amanda Amanda [name] is a 48 y.o. female coming in with complaint of back pain. States that she has not been doing very well as her father passed away. Her back pain is unchanged since the last visit. Here for OMT to manage her pain. Is having aches and pains everywhere due to feeling depressed about passing of her father.  Patient is having difficulty in getting control of her life.  Patient has had a significant number of different medication changes and does not know if they are helping.  Patient has had carpal tunnel previously and is wondering if an injection is needed but would like to avoid it if possible.  Also having some mild discomfort over the foot.    Past Medical History:  Diagnosis Date  . Anxiety   . Breast mass 04/2012   left breast  . Complication of anesthesia    states was hard to wake up post-op  . Depression   . Dry cough    since vocal cord surgery  . Sleep related teeth grinding    wears mouth guard at night   Past Surgical History:  Procedure Laterality Date  . AUGMENTATION MAMMAPLASTY Bilateral 2014  . BREAST MASS EXCISION Left 05/26/2012  . CERVIX SURGERY  2005   papilloma  . UMBILICAL HERNIA REPAIR  as a child   UHR  . vocal cord polyp excision  1999   Social History   Socioeconomic History  . Marital status: Married    Spouse name: Not on file  . Number of children: 1  . Years of education: Not on file  . Highest education level: Not on file  Occupational History  . Occupation: Spanish Professor  Social Needs  . Financial resource strain: Not on file  . Food insecurity:    Worry: Not on file    Inability: Not on file  . Transportation needs:    Medical: Not on file    Non-medical: Not on file  Tobacco Use  .  Smoking status: Former Smoker    Years: 25.00    Types: Cigarettes  . Smokeless tobacco: Never Used  . Tobacco comment: smokes 3 cig./day  Substance and Sexual Activity  . Alcohol use: Yes    Alcohol/week: 0.0 standard drinks    Comment: occasional wine  . Drug use: Amanda [name]  . Sexual activity: Not on file  Lifestyle  . Physical activity:    Days per week: Not on file    Minutes per session: Not on file  . Stress: Not on file  Relationships  . Social connections:    Talks on phone: Not on file    Gets together: Not on file    Attends religious service: Not on file    Active member of club or organization: Not on file    Attends meetings of clubs or organizations: Not on file    Relationship status: Not on file  Other Topics Concern  . Not on file  Social History Narrative  . Not on file   Allergies  Allergen Reactions  . Chloraprep One Step [Chlorhexidine Gluconate]   . Oxycodone Rash    Rash all over    Family History  Problem Relation Age of Onset  . Breast cancer Mother 32  .  Brain cancer Maternal Uncle   . Leukemia Maternal Grandmother   . Diabetes Maternal Grandfather   . Depression Paternal Grandfather          Current Outpatient Medications (Other):  Marland Kitchen  Diclofenac Sodium 2 % SOLN, Place 2 g onto the skin 2 (two) times daily. .  hydrOXYzine (ATARAX/VISTARIL) 10 MG tablet, Take 1 tablet (10 mg total) by mouth 3 (three) times daily as needed. .  hyoscyamine (LEVBID) 0.375 MG 12 hr tablet, Take 1 tablet (0.375 mg total) by mouth 2 (two) times daily. Marland Kitchen  lisdexamfetamine (VYVANSE) 40 MG capsule, Take 40 mg by mouth every morning. .  Melatonin 2.5 MG CAPS, Take by mouth. .  Milk Thistle 1000 MG CAPS, Take 1,000 mg by mouth daily. .  Vilazodone HCl 20 MG TABS, Take by mouth. .  Vitamin D, Ergocalciferol, (DRISDOL) 1.25 MG (50000 UT) CAPS capsule, Take 1 capsule (50,000 Units total) by mouth every 7 (seven) days.    Past medical history, social, surgical and  family history all reviewed in electronic medical record.  Amanda [name] pertanent information unless stated regarding to the chief complaint.   Review of Systems:  Amanda [name] headache, visual changes, nausea, vomiting, diarrhea, constipation, dizziness, abdominal pain, skin rash, fevers, chills, night sweats, weight loss, swollen lymph nodes, body aches, joint swelling,, chest pain, shortness of breath, mood changes.  Positive muscle aches  Objective  Blood pressure 106/72, pulse 94, height 5\' 3"  (1.6 m), SpO2 98 %.    General: Amanda [name] apparent distress alert and oriented x3 mood and affect normal, dressed appropriately.  Patient is very tearful  HEENT: Pupils equal, extraocular movements intact  Respiratory: Patient's speak in full sentences and does not appear short of breath  Cardiovascular: Amanda [name] lower extremity edema, non tender, Amanda [name] erythema  Skin: Warm dry intact with Amanda [name] signs of infection or rash on extremities or on axial skeleton.  Abdomen: Soft nontender  Neuro: Cranial nerves II through XII are intact, neurovascularly intact in all extremities with 2+ DTRs and 2+ pulses.  Lymph: Amanda [name] lymphadenopathy of posterior or anterior cervical chain or axillae bilaterally.  Gait normal with good balance and coordination.  MSK:  Non tender with full range of motion and good stability and symmetric strength and tone of shoulders, elbows, wrist, hip, knee and ankles bilaterally.  Back Exam:  Inspection: Loss of lordosis Motion: Flexion 45 deg, Extension 25 deg, Side Bending to 35 deg bilaterally,  Rotation to 35 deg bilaterally  SLR laying: Negative  XSLR laying: Negative  Palpable tenderness: Tender to palpation in the paraspinal musculature lumbar spine. FABER: Tightness bilaterally. Sensory change: Gross sensation intact to all lumbar and sacral dermatomes.  Reflexes: 2+ at both patellar tendons, 2+ at achilles tendons, Babinski's downgoing.  Strength at foot  Plantar-flexion: 5/5 Dorsi-flexion: 5/5 Eversion: 5/5  Inversion: 5/5  Leg strength  Quad: 5/5 Hamstring: 5/5 Hip flexor: 5/5 Hip abductors: 5/5  Gait unremarkable.  Left wrist exam shows the patient does have positive Tinel's very similar to the contralateral side.  Grip strength though is noted 5 out of 5  Osteopathic findings  T9 extended rotated and side bent left L2 flexed rotated and side bent right Sacrum right on right    Impression and Recommendations:     This case required medical decision making of moderate complexity. The above documentation has been reviewed and is accurate and complete Amanda Pulley, DO       Note: This dictation was prepared with Dragon dictation along with  smaller phrase technology. Any transcriptional errors that result from this process are unintentional.

## 2019-02-22 ENCOUNTER — Ambulatory Visit (INDEPENDENT_AMBULATORY_CARE_PROVIDER_SITE_OTHER): Payer: BC Managed Care – PPO | Admitting: Family Medicine

## 2019-02-22 ENCOUNTER — Other Ambulatory Visit: Payer: Self-pay

## 2019-02-22 ENCOUNTER — Encounter: Payer: Self-pay | Admitting: Family Medicine

## 2019-02-22 VITALS — BP 106/72 | HR 94 | Ht 63.0 in

## 2019-02-22 DIAGNOSIS — M999 Biomechanical lesion, unspecified: Secondary | ICD-10-CM | POA: Diagnosis not present

## 2019-02-22 DIAGNOSIS — M545 Low back pain, unspecified: Secondary | ICD-10-CM

## 2019-02-22 DIAGNOSIS — G8929 Other chronic pain: Secondary | ICD-10-CM

## 2019-02-22 DIAGNOSIS — F411 Generalized anxiety disorder: Secondary | ICD-10-CM | POA: Diagnosis not present

## 2019-02-22 NOTE — Assessment & Plan Note (Signed)
Moderate to severe and worsening likely secondary to psychosomatic.  Not as active.  Did respond well to manipulation.  Follow-up again in 3 weeks

## 2019-02-22 NOTE — Patient Instructions (Signed)
Good to see you  I am so sorry for your loss.  Stay active when you can  We will call you when we get the ortotics.  Hydroxyzine up to 3 times a day  See me again in 3-4 weeks to check in

## 2019-02-22 NOTE — Assessment & Plan Note (Signed)
Decision today to treat with OMT was based on Physical Exam  After verbal consent patient was treated with HVLA, ME, FPR techniques in  thoracic, lumbar and sacral areas  Patient tolerated the procedure well with improvement in symptoms  Patient given exercises, stretches and lifestyle modifications  See medications in patient instructions if given  Patient will follow up in 3-4 weeks

## 2019-02-22 NOTE — Assessment & Plan Note (Signed)
Patient has significant generalized anxiety disorder and is not well controlled at the moment.  I do believe that depression and grieving is also contributing.  Patient is under the care of a psychiatrist.  Patient is looking to take a medical leave and go back to [place] to help her mother after the death of her father.  I do think that this is a good idea for her.  Once it is safe to travel then she will be able to go.  Patient is in agreement with the plan.  Wrote a letter to this.  Will see me again in 3 to 4 weeks if still here.

## 2019-02-28 ENCOUNTER — Telehealth: Payer: Self-pay

## 2019-02-28 NOTE — Telephone Encounter (Signed)
Scheduled appointment for orthotics on 6/11 at 11am

## 2019-03-02 ENCOUNTER — Ambulatory Visit (INDEPENDENT_AMBULATORY_CARE_PROVIDER_SITE_OTHER): Payer: BC Managed Care – PPO | Admitting: Family Medicine

## 2019-03-02 ENCOUNTER — Other Ambulatory Visit: Payer: Self-pay

## 2019-03-02 DIAGNOSIS — G5762 Lesion of plantar nerve, left lower limb: Secondary | ICD-10-CM | POA: Diagnosis not present

## 2019-03-02 NOTE — Assessment & Plan Note (Signed)
Orthotics made today.  Discussed with patient to increase more slowly over the course of next several weeks.  Discussed trying 2 hours of first increasing hour a day.  Can make adjustments over the course of the next 2 weeks.  Patient will follow-up with me again in 4 weeks

## 2019-03-02 NOTE — Progress Notes (Signed)
Procedure Note   Patient was fitted for a : standard, cushioned, semi-rigid orthotic. The orthotic was heated and afterward the patient patient seated position and molded The patient was positioned in subtalar neutral position and 10 degrees of ankle dorsiflexion in a weight bearing stance. After completion of molding, patient did have orthotic management The blank was ground to a stable position for weight bearing. Size: 6.5 comfort Base: Carbon fiber Additional Posting and Padding: Left and right medial 300/120 270/90 transverse 200/40 The patient ambulated these, and they were very comfortable.

## 2019-03-09 ENCOUNTER — Ambulatory Visit: Payer: BC Managed Care – PPO | Admitting: Family Medicine

## 2019-03-14 ENCOUNTER — Other Ambulatory Visit: Payer: Self-pay | Admitting: Family Medicine

## 2019-03-14 NOTE — Progress Notes (Signed)
[name] [address], [place] [address] Phone: [phone] Subjective:   I [name] am serving as a Education administrator for Dr. Hulan Saas.   CC: Neck and back pain  [id number]  Amanda [name] is a 48 y.o. female coming in with complaint of foot pain. States that she loves the orthotics and that her foot is feeling better. States that she is doing well. She is very happy.  Much better than last exam.  States that the tingling in the hands is improved as well.  Patient feels like she can breathe a little better now that she knows that she will be able to go back home to see family.      Past Medical History:  Diagnosis Date  . Anxiety   . Breast mass 04/2012   left breast  . Complication of anesthesia    states was hard to wake up post-op  . Depression   . Dry cough    since vocal cord surgery  . Sleep related teeth grinding    wears mouth guard at night   Past Surgical History:  Procedure Laterality Date  . AUGMENTATION MAMMAPLASTY Bilateral 2014  . BREAST MASS EXCISION Left 05/26/2012  . CERVIX SURGERY  2005   papilloma  . UMBILICAL HERNIA REPAIR  as a child   UHR  . vocal cord polyp excision  1999   Social History   Socioeconomic History  . Marital status: Married    Spouse name: Not on file  . Number of children: 1  . Years of education: Not on file  . Highest education level: Not on file  Occupational History  . Occupation: Spanish Professor  Social Needs  . Financial resource strain: Not on file  . Food insecurity    Worry: Not on file    Inability: Not on file  . Transportation needs    Medical: Not on file    Non-medical: Not on file  Tobacco Use  . Smoking status: Former Smoker    Years: 25.00    Types: Cigarettes  . Smokeless tobacco: Never Used  . Tobacco comment: smokes 3 cig./day  Substance and Sexual Activity  . Alcohol use: Yes    Alcohol/week: 0.0 standard drinks    Comment: occasional wine  .  Drug use: No  . Sexual activity: Not on file  Lifestyle  . Physical activity    Days per week: Not on file    Minutes per session: Not on file  . Stress: Not on file  Relationships  . Social Herbalist on phone: Not on file    Gets together: Not on file    Attends religious service: Not on file    Active member of club or organization: Not on file    Attends meetings of clubs or organizations: Not on file    Relationship status: Not on file  Other Topics Concern  . Not on file  Social History Narrative  . Not on file   Allergies  Allergen Reactions  . Chloraprep One Step [Chlorhexidine Gluconate]   . Oxycodone Rash    Rash all over    Family History  Problem Relation Age of Onset  . Breast cancer Mother 56  . Brain cancer Maternal Uncle   . Leukemia Maternal Grandmother   . Diabetes Maternal Grandfather   . Depression Paternal Grandfather          Current Outpatient Medications (Other):  .  Diclofenac Sodium 2 % SOLN, Place 2 g onto the skin 2 (two) times daily. .  hydrOXYzine (ATARAX/VISTARIL) 10 MG tablet, Take 1 tablet (10 mg total) by mouth 3 (three) times daily as needed. .  hyoscyamine (LEVBID) 0.375 MG 12 hr tablet, Take 1 tablet (0.375 mg total) by mouth 2 (two) times daily. Marland Kitchen  lisdexamfetamine (VYVANSE) 40 MG capsule, Take 40 mg by mouth every morning. .  Melatonin 2.5 MG CAPS, Take by mouth. .  Milk Thistle 1000 MG CAPS, Take 1,000 mg by mouth daily. .  Vilazodone HCl 20 MG TABS, Take by mouth. .  Vitamin D, Ergocalciferol, (DRISDOL) 1.25 MG (50000 UT) CAPS capsule, TAKE 1 CAPSULE BY MOUTH EVERY 7 DAYS.    Past medical history, social, surgical and family history all reviewed in electronic medical record.  No pertanent information unless stated regarding to the chief complaint.   Review of Systems:  No headache, visual changes, nausea, vomiting, diarrhea, constipation, dizziness, abdominal pain, skin rash, fevers, chills, night sweats, weight  loss, swollen lymph nodes, body aches, joint swelling,  chest pain, shortness of breath, mood changes.  Positive muscle aches  Objective  Blood pressure 102/70, pulse (!) 102, height 5\' 3"  (1.6 m), weight 126 lb (57.2 kg), SpO2 99 %.    General: No apparent distress alert and oriented x3 mood and affect normal, dressed appropriately.  HEENT: Pupils equal, extraocular movements intact  Respiratory: Patient's speak in full sentences and does not appear short of breath  Cardiovascular: No lower extremity edema, non tender, no erythema  Skin: Warm dry intact with no signs of infection or rash on extremities or on axial skeleton.  Abdomen: Soft nontender  Neuro: Cranial nerves II through XII are intact, neurovascularly intact in all extremities with 2+ DTRs and 2+ pulses.  Lymph: No lymphadenopathy of posterior or anterior cervical chain or axillae bilaterally.  Gait normal with good balance and coordination.  MSK:  Non tender with full range of motion and good stability and symmetric strength and tone of shoulders, elbows, wrist, hip, knee and ankles bilaterally.  Neck: Inspection mild loss of lordosis. No palpable stepoffs. Negative Spurling's maneuver. Full neck range of motion Grip strength and sensation normal in bilateral hands Strength good C4 to T1 distribution No sensory change to C4 to T1 Negative Hoffman sign bilaterally Reflexes normal  Back Exam:  Inspection: Unremarkable  Motion: Flexion 45 deg, Extension 25 deg, Side Bending to 45 deg bilaterally,  Rotation to 45 deg bilaterally  SLR laying: Negative  XSLR laying: Negative  Palpable tenderness: No tenderness to palpation. FABER: negative. Sensory change: Gross sensation intact to all lumbar and sacral dermatomes.  Reflexes: 2+ at both patellar tendons, 2+ at achilles tendons, Babinski's downgoing.  Strength at foot  Plantar-flexion: 5/5 Dorsi-flexion: 5/5 Eversion: 5/5 Inversion: 5/5  Leg strength  Quad: 5/5  Hamstring: 5/5 Hip flexor: 5/5 Hip abductors: 5/5  Gait unremarkable.  Osteopathic findings  T9 extended rotated and side bent left L2 flexed rotated and side bent right Sacrum right on right    Impression and Recommendations:     This case required medical decision making of moderate complexity. The above documentation has been reviewed and is accurate and complete [name], DO       Note: This dictation was prepared with Dragon dictation along with smaller phrase technology. Any transcriptional errors that result from this process are unintentional.

## 2019-03-15 ENCOUNTER — Ambulatory Visit: Payer: BC Managed Care – PPO | Admitting: Family Medicine

## 2019-03-15 ENCOUNTER — Encounter: Payer: Self-pay | Admitting: Family Medicine

## 2019-03-15 ENCOUNTER — Other Ambulatory Visit: Payer: Self-pay

## 2019-03-15 VITALS — BP 102/70 | HR 102 | Ht 63.0 in | Wt 126.0 lb

## 2019-03-15 DIAGNOSIS — M999 Biomechanical lesion, unspecified: Secondary | ICD-10-CM | POA: Diagnosis not present

## 2019-03-15 DIAGNOSIS — M545 Low back pain, unspecified: Secondary | ICD-10-CM

## 2019-03-15 DIAGNOSIS — G8929 Other chronic pain: Secondary | ICD-10-CM | POA: Diagnosis not present

## 2019-03-15 NOTE — Patient Instructions (Signed)
You are great  Doctor of osteopathic medicine.  (DO) Sacro-illiac dysfunction  See me when you get back!

## 2019-03-15 NOTE — Assessment & Plan Note (Signed)
Improvement from last exam.  Does still have some underlying stress.  Discussed which activities of doing which wants to avoid.  Discussed posture and ergonomics.  Follow-up again in 4 to 8 weeks

## 2019-03-15 NOTE — Assessment & Plan Note (Signed)
Decision today to treat with OMT was based on Physical Exam  After verbal consent patient was treated with HVLA, ME, FPR techniques in  thoracic, lumbar and sacral areas  Patient tolerated the procedure well with improvement in symptoms  Patient given exercises, stretches and lifestyle modifications  See medications in patient instructions if given  Patient will follow up in 4-8 weeks 

## 2019-03-20 ENCOUNTER — Telehealth: Payer: Self-pay | Admitting: Gastroenterology

## 2019-03-20 ENCOUNTER — Telehealth: Payer: Self-pay | Admitting: Family Medicine

## 2019-03-20 NOTE — Telephone Encounter (Signed)
Patient called wanting to get medication refill for 35months because she is leaving the country. She stated she got approval from insurance but she needs to know what to do next.

## 2019-03-20 NOTE — Telephone Encounter (Signed)
Patient states she is leaving for [place] in 2 days.  She has gotten an override from her pharmacy to get 6 month rx for Vit D and Hydroxyzine.  Patient uses [facility] [facility].  Please follow up with pt in regard.

## 2019-03-21 MED ORDER — HYDROXYZINE HCL 10 MG PO TABS: 10.0000 mg | ORAL_TABLET | Freq: Three times a day (TID) | ORAL | 0 refills | Status: DC | PRN

## 2019-03-21 MED ORDER — VITAMIN D (ERGOCALCIFEROL) 1.25 MG (50000 UNIT) PO CAPS: ORAL_CAPSULE | ORAL | 0 refills | Status: DC

## 2019-03-21 NOTE — Telephone Encounter (Signed)
Left patient a message to call the office to set up a virtural visit to discuss medication renewal as Dr Silverio Decamp has never seen her since changing providers

## 2019-03-21 NOTE — Telephone Encounter (Signed)
Please schedule her for virtual visit next available given I never saw her. Thanks

## 2019-03-21 NOTE — Telephone Encounter (Signed)
Dr Silverio Decamp.  This is a former patient of Dr Ardis Hughs that you took over her care in 2019. She had an office visit with you 07/2018 but cancelled. She states she is leaving the country for 6 months and wants her hyoscyamine refilled. Please advise.  Thanks [employer]

## 2019-03-21 NOTE — Telephone Encounter (Signed)
Vitamin D sent into [facility] for 6 months.  Dr. Tamala Julian will need to approve the hydroxyzine for 6 months.

## 2019-03-22 ENCOUNTER — Encounter: Payer: Self-pay | Admitting: Gastroenterology

## 2019-03-22 ENCOUNTER — Ambulatory Visit (INDEPENDENT_AMBULATORY_CARE_PROVIDER_SITE_OTHER): Payer: BC Managed Care – PPO | Admitting: Gastroenterology

## 2019-03-22 ENCOUNTER — Other Ambulatory Visit (INDEPENDENT_AMBULATORY_CARE_PROVIDER_SITE_OTHER): Payer: BC Managed Care – PPO

## 2019-03-22 VITALS — Ht 63.0 in | Wt 126.0 lb

## 2019-03-22 DIAGNOSIS — K588 Other irritable bowel syndrome: Secondary | ICD-10-CM

## 2019-03-22 DIAGNOSIS — R140 Abdominal distension (gaseous): Secondary | ICD-10-CM

## 2019-03-22 LAB — IGA: IgA: 154 mg/dL (ref 68–378)

## 2019-03-22 MED ORDER — HYOSCYAMINE SULFATE ER 0.375 MG PO TB12: 0.3750 mg | ORAL_TABLET | Freq: Two times a day (BID) | ORAL | 3 refills | Status: DC

## 2019-03-22 NOTE — Progress Notes (Signed)
Amanda [name]    [id number]    1971-01-24  Primary Care Physician:White, Caren Griffins, MD  Referring Physician: [name], MD [address] [place],  [place] [address]  This service was provided via audio and video telemedicine (Doximity) due to [place] 19 pandemic.  Patient location: Home Provider location: Office Used 2 patient identifiers to confirm the correct person. Explained the limitations in evaluation and management via telemedicine. Patient is aware of potential medical charges for this visit.  Patient consented to this virtual visit.  The persons participating in this telemedicine service were myself and the patient   Chief complaint: Abdominal  bloating HPI: 48 year old very pleasant female previously followed by Dr. Ardis Hughs here for follow-up visit. She has intermittent abdominal bloating with flatulence and cramping.  Her symptoms resolved to a large extent with daily hyoscyamine.  She drinks milk every day, denies any worsening symptoms when she consumes increased amount of dairy products.  She thinks her mother is lactose intolerant.  No family history of GI malignancy, IBD or celiac disease.  She has never been tested for celiac. Denies any nausea, vomiting, abdominal pain, melena or bright red blood per rectum  She had weight loss since she started taking lisdexamphetamine otherwise denies any decreased appetite.   Outpatient Encounter Medications as of 03/22/2019  Medication Sig  . Diclofenac Sodium 2 % SOLN Place 2 g onto the skin 2 (two) times daily.  . hydrOXYzine (ATARAX/VISTARIL) 10 MG tablet Take 1 tablet (10 mg total) by mouth 3 (three) times daily as needed.  . hyoscyamine (LEVBID) 0.375 MG 12 hr tablet Take 1 tablet (0.375 mg total) by mouth 2 (two) times daily.  Marland Kitchen lisdexamfetamine (VYVANSE) 40 MG capsule Take 40 mg by mouth every morning.  . Melatonin 2.5 MG CAPS Take by mouth.  . Milk Thistle 1000 MG CAPS Take 1,000 mg by  mouth daily.  . Vilazodone HCl 20 MG TABS Take by mouth.  . Vitamin D, Ergocalciferol, (DRISDOL) 1.25 MG (50000 UT) CAPS capsule TAKE 1 CAPSULE BY MOUTH EVERY 7 DAYS.   No facility-administered encounter medications on file as of 03/22/2019.     Allergies as of 03/22/2019 - Review Complete 03/22/2019  Allergen Reaction Noted  . Chloraprep one step [chlorhexidine gluconate]  06/02/2012  . Oxycodone Rash 05/30/2012    Past Medical History:  Diagnosis Date  . Anxiety   . Breast mass 04/2012   left breast  . Complication of anesthesia    states was hard to wake up post-op  . Depression   . Dry cough    since vocal cord surgery  . Sleep related teeth grinding    wears mouth guard at night    Past Surgical History:  Procedure Laterality Date  . AUGMENTATION MAMMAPLASTY Bilateral 2014  . BREAST MASS EXCISION Left 05/26/2012  . CERVIX SURGERY  2005   papilloma  . UMBILICAL HERNIA REPAIR  as a child   UHR  . vocal cord polyp excision  1999    Family History  Problem Relation Age of Onset  . Breast cancer Mother 37  . Brain cancer Maternal Uncle   . Leukemia Maternal Grandmother   . Diabetes Maternal Grandfather   . Depression Paternal Grandfather     Social History   Socioeconomic History  . Marital status: Married    Spouse name: Not on file  . Number of children: 1  . Years of education: Not on  file  . Highest education level: Not on file  Occupational History  . Occupation: Spanish Professor  Social Needs  . Financial resource strain: Not on file  . Food insecurity    Worry: Not on file    Inability: Not on file  . Transportation needs    Medical: Not on file    Non-medical: Not on file  Tobacco Use  . Smoking status: Former Smoker    Years: 25.00    Types: Cigarettes  . Smokeless tobacco: Never Used  . Tobacco comment: smokes 3 cig./day  Substance and Sexual Activity  . Alcohol use: Yes    Alcohol/week: 0.0 standard drinks    Comment: occasional wine   . Drug use: No  . Sexual activity: Not on file  Lifestyle  . Physical activity    Days per week: Not on file    Minutes per session: Not on file  . Stress: Not on file  Relationships  . Social Herbalist on phone: Not on file    Gets together: Not on file    Attends religious service: Not on file    Active member of club or organization: Not on file    Attends meetings of clubs or organizations: Not on file    Relationship status: Not on file  . Intimate partner violence    Fear of current or ex partner: Not on file    Emotionally abused: Not on file    Physically abused: Not on file    Forced sexual activity: Not on file  Other Topics Concern  . Not on file  Social History Narrative  . Not on file      Review of systems: Review of Systems as per HPI All other systems reviewed and are negative.   Physical Exam: Vitals were not taken and physical exam was not performed during this virtual visit.  Data Reviewed:  Reviewed labs, radiology imaging, old records and pertinent past GI work up   Assessment and Plan/Recommendations:  48 year old female with complaints of intermittent bloating and abdominal cramping likely secondary to irritable bowel syndrome We will check TTG IgA antibody to exclude celiac disease Use hyoscyamine 1 tablet twice daily as needed for symptom relief  Follow-up in 1 year or sooner if needed    K. Denzil Magnuson , MD   CC: [name], MD

## 2019-03-22 NOTE — Patient Instructions (Addendum)
Check TTG IgA antibody and total IgA level to exclude celiac disease ( Come to the lab at [facility] basement level to get your labs drawn )   Send refill for hyoscyamine twice daily as needed, for 1 year ( Prescription sent to your pharmacy)  Follow-up office visit in 1 year  I appreciate the  opportunity to care for you  Thank You   Harl Bowie , MD

## 2019-03-23 LAB — TISSUE TRANSGLUTAMINASE, IGA: (tTG) Ab, IgA: 1 U/mL

## 2019-07-14 ENCOUNTER — Other Ambulatory Visit: Payer: Self-pay

## 2019-07-14 DIAGNOSIS — Z20822 Contact with and (suspected) exposure to covid-19: Secondary | ICD-10-CM

## 2019-07-16 LAB — NOVEL CORONAVIRUS, NAA: SARS-CoV-2, NAA: NOT DETECTED

## 2019-08-07 ENCOUNTER — Other Ambulatory Visit: Payer: Self-pay

## 2019-08-07 DIAGNOSIS — Z20822 Contact with and (suspected) exposure to covid-19: Secondary | ICD-10-CM

## 2019-08-09 ENCOUNTER — Other Ambulatory Visit: Payer: Self-pay | Admitting: Family Medicine

## 2019-08-09 LAB — NOVEL CORONAVIRUS, NAA: SARS-CoV-2, NAA: NOT DETECTED

## 2019-08-23 ENCOUNTER — Encounter: Payer: Self-pay | Admitting: Family Medicine

## 2019-08-23 ENCOUNTER — Other Ambulatory Visit: Payer: Self-pay

## 2019-08-23 ENCOUNTER — Ambulatory Visit: Payer: BC Managed Care – PPO | Admitting: Family Medicine

## 2019-08-23 VITALS — BP 102/62 | HR 75 | Ht 63.0 in | Wt 121.0 lb

## 2019-08-23 DIAGNOSIS — M545 Low back pain, unspecified: Secondary | ICD-10-CM

## 2019-08-23 DIAGNOSIS — M999 Biomechanical lesion, unspecified: Secondary | ICD-10-CM | POA: Diagnosis not present

## 2019-08-23 DIAGNOSIS — G8929 Other chronic pain: Secondary | ICD-10-CM

## 2019-08-23 NOTE — Assessment & Plan Note (Signed)
Decision today to treat with OMT was based on Physical Exam  After verbal consent patient was treated with HVLA, ME, FPR techniques in  thoracic, lumbar and sacral areas  Patient tolerated the procedure well with improvement in symptoms  Patient given exercises, stretches and lifestyle modifications  See medications in patient instructions if given  Patient will follow up in 4-8 weeks 

## 2019-08-23 NOTE — Assessment & Plan Note (Signed)
TTP discussed HEP  Discussed core strengthening  RTC in 4-6 weeks

## 2019-08-23 NOTE — Progress Notes (Signed)
[name] [place], [place] [address] Phone: [phone] Subjective:   Amanda [name], am serving as a scribe for Dr. Hulan Saas.  This visit occurred during the SARS-CoV-2 public health emergency.  Safety protocols were in place, including screening questions prior to the visit, additional usage of staff PPE, and extensive cleaning of exam room while observing appropriate contact time as indicated for disinfecting solutions.       CC: Back pain follow up   [id number]  Amanda [name] is a 48 y.o. female coming in with complaint of back pain. Patient is here for OMT. Last seen in June 2020. Patient states that her back pain has not changed. Continues to have lower back pain in the mornings especially.      Past Medical History:  Diagnosis Date  . Anxiety   . Breast mass 04/2012   left breast  . Complication of anesthesia    states was hard to wake up post-op  . Depression   . Dry cough    since vocal cord surgery  . Sleep related teeth grinding    wears mouth guard at night   Past Surgical History:  Procedure Laterality Date  . AUGMENTATION MAMMAPLASTY Bilateral 2014  . BREAST MASS EXCISION Left 05/26/2012  . CERVIX SURGERY  2005   papilloma  . UMBILICAL HERNIA REPAIR  as a child   UHR  . vocal cord polyp excision  1999   Social History   Socioeconomic History  . Marital status: Married    Spouse name: Not on file  . Number of children: 1  . Years of education: Not on file  . Highest education level: Not on file  Occupational History  . Occupation: Spanish Professor  Social Needs  . Financial resource strain: Not on file  . Food insecurity    Worry: Not on file    Inability: Not on file  . Transportation needs    Medical: Not on file    Non-medical: Not on file  Tobacco Use  . Smoking status: Former Smoker    Years: 25.00    Types: Cigarettes  . Smokeless tobacco: Never Used  . Tobacco comment: smokes 3  cig./day  Substance and Sexual Activity  . Alcohol use: Yes    Alcohol/week: 0.0 standard drinks    Comment: occasional wine  . Drug use: [name]  . Sexual activity: Not on file  Lifestyle  . Physical activity    Days per week: Not on file    Minutes per session: Not on file  . Stress: Not on file  Relationships  . Social Herbalist on phone: Not on file    Gets together: Not on file    Attends religious service: Not on file    Active member of club or organization: Not on file    Attends meetings of clubs or organizations: Not on file    Relationship status: Not on file  Other Topics Concern  . Not on file  Social History Narrative  . Not on file   Allergies  Allergen Reactions  . Chloraprep One Step [Chlorhexidine Gluconate]   . Oxycodone Rash    Rash all over    Family History  Problem Relation Age of Onset  . Breast cancer Mother 8  . Brain cancer Maternal Uncle   . Leukemia Maternal Grandmother   . Diabetes Maternal Grandfather   . Depression Paternal Grandfather  Current Outpatient Medications (Other):  Marland Kitchen  Diclofenac Sodium 2 % SOLN, Place 2 g onto the skin 2 (two) times daily. .  hydrOXYzine (ATARAX/VISTARIL) 10 MG tablet, TAKE 1 TABLET BY MOUTH THREE TIMES A DAY AS NEEDED .  hyoscyamine (LEVBID) 0.375 MG 12 hr tablet, Take 1 tablet (0.375 mg total) by mouth 2 (two) times daily. Marland Kitchen  lisdexamfetamine (VYVANSE) 40 MG capsule, Take 40 mg by mouth every morning. .  Melatonin 2.5 MG CAPS, Take by mouth. .  Milk Thistle 1000 MG CAPS, Take 1,000 mg by mouth daily. .  Vilazodone HCl 20 MG TABS, Take by mouth. .  Vitamin D, Ergocalciferol, (DRISDOL) 1.25 MG (50000 UT) CAPS capsule, TAKE 1 CAPSULE BY MOUTH EVERY 7 DAYS.    Past medical history, social, surgical and family history all reviewed in electronic medical record.  [name] pertanent information unless stated regarding to the chief complaint.   Review of Systems:  [name] headache, visual changes,  nausea, vomiting, diarrhea, constipation, dizziness, abdominal pain, skin rash, fevers, chills, night sweats, weight loss, swollen lymph nodes, body aches, joint swelling, chest pain, shortness of breath, mood changes. Positive Muscle pain   Objective  Blood pressure 102/62, pulse 75, height 5\' 3"  (1.6 m), weight 121 lb (54.9 kg), SpO2 99 %.    General: [name] apparent distress alert and oriented x3 mood and affect normal, dressed appropriately.  HEENT: Pupils equal, extraocular movements intact  Respiratory: Patient's speak in full sentences and does not appear short of breath  Cardiovascular: [name] lower extremity edema, non tender, [name] erythema  Skin: Warm dry intact with [name] signs of infection or rash on extremities or on axial skeleton.  Abdomen: Soft nontender  Neuro: Cranial nerves II through XII are intact, neurovascularly intact in all extremities with 2+ DTRs and 2+ pulses.  Lymph: [name] lymphadenopathy of posterior or anterior cervical chain or axillae bilaterally.  Gait normal with good balance and coordination.  MSK:  tender with limited range of motion and good stability and symmetric strength and tone of shoulders, elbows, wrist, hip, knee and ankles bilaterally.    Back - Normal skin, Spine with normal alignment and [name] deformity.  [name] tenderness to vertebral process palpation.  Paraspinous TTP in lumbar spine right sided   Range of motion is full at neck and lumbar sacral regions but pain in right and left sacral area   Osteopathic findings  T9 extended rotated and side bent left L2 flexed rotated and side bent right Sacrum right on right      Impression and Recommendations:     This case required medical decision making of moderate complexity. The above documentation has been reviewed and is accurate and complete [name], DO       Note: This dictation was prepared with Dragon dictation along with smaller phrase technology. Any transcriptional errors that result from  this process are unintentional.

## 2019-10-12 ENCOUNTER — Other Ambulatory Visit: Payer: Self-pay

## 2019-10-12 ENCOUNTER — Ambulatory Visit: Payer: BC Managed Care – PPO | Admitting: Family Medicine

## 2019-10-12 ENCOUNTER — Encounter: Payer: Self-pay | Admitting: Family Medicine

## 2019-10-12 VITALS — BP 100/72 | Ht 63.0 in

## 2019-10-12 DIAGNOSIS — M999 Biomechanical lesion, unspecified: Secondary | ICD-10-CM

## 2019-10-12 DIAGNOSIS — M545 Low back pain, unspecified: Secondary | ICD-10-CM

## 2019-10-12 DIAGNOSIS — G8929 Other chronic pain: Secondary | ICD-10-CM

## 2019-10-12 NOTE — Assessment & Plan Note (Signed)
Patient back exam still seems to be multifactorial.  Continuing to have some difficulty with depression.  Trying a different medication and hopefully that we will have some improvement with this as well.  At this moment I would like to see patient in 4-week intervals to make sure she is doing relatively well.

## 2019-10-12 NOTE — Patient Instructions (Signed)
See me again in 4 weeks  

## 2019-10-12 NOTE — Assessment & Plan Note (Signed)
Decision today to treat with OMT was based on Physical Exam  After verbal consent patient was treated with HVLA, ME, FPR techniques in  thoracic, lumbar and sacral areas  Patient tolerated the procedure well with improvement in symptoms  Patient given exercises, stretches and lifestyle modifications  See medications in patient instructions if given  Patient will follow up in 4-6 weeks 

## 2019-10-12 NOTE — Progress Notes (Signed)
[facility] Phone: [phone] Subjective:   [name], am serving as a scribe for Dr. Hulan Saas. This visit occurred during the SARS-CoV-2 public health emergency.  Safety protocols were in place, including screening questions prior to the visit, additional usage of staff PPE, and extensive cleaning of exam room while observing appropriate contact time as indicated for disinfecting solutions.    CC: Low back pain follow-up  [id number]  [name] is a 49 y.o. female coming in with complaint of back pain. Last seen on 08/23/2019 for OMT. Patient states that her back pain has been good since last visit.  Patient is having some difficulty with her depression.  Feels like that is contributing to some of the aches and pains as well.  Patient though was trying to be active and is working out and feels like that has helped some of her mood as well as some of her back pain.      Past Medical History:  Diagnosis Date  . Anxiety   . Breast mass 04/2012   left breast  . Complication of anesthesia    states was hard to wake up post-op  . Depression   . Dry cough    since vocal cord surgery  . Sleep related teeth grinding    wears mouth guard at night   Past Surgical History:  Procedure Laterality Date  . AUGMENTATION MAMMAPLASTY Bilateral 2014  . BREAST MASS EXCISION Left 05/26/2012  . CERVIX SURGERY  2005   papilloma  . UMBILICAL HERNIA REPAIR  as a child   UHR  . vocal cord polyp excision  1999   Social History   Socioeconomic History  . Marital status: Married    Spouse name: Not on file  . Number of children: 1  . Years of education: Not on file  . Highest education level: Not on file  Occupational History  . Occupation: Spanish Professor  Tobacco Use  . Smoking status: Former Smoker    Years: 25.00    Types: Cigarettes  . Smokeless tobacco: Never Used  . Tobacco comment: smokes 3  cig./day  Substance and Sexual Activity  . Alcohol use: Yes    Alcohol/week: 0.0 standard drinks    Comment: occasional wine  . Drug use: No  . Sexual activity: Not on file  Other Topics Concern  . Not on file  Social History Narrative  . Not on file   Social Determinants of Health   Financial Resource Strain:   . Difficulty of Paying Living Expenses: Not on file  Food Insecurity:   . Worried About Charity fundraiser in the Last Year: Not on file  . Ran Out of Food in the Last Year: Not on file  Transportation Needs:   . Lack of Transportation (Medical): Not on file  . Lack of Transportation (Non-Medical): Not on file  Physical Activity:   . Days of Exercise per Week: Not on file  . Minutes of Exercise per Session: Not on file  Stress:   . Feeling of Stress : Not on file  Social Connections:   . Frequency of Communication with Friends and Family: Not on file  . Frequency of Social Gatherings with Friends and Family: Not on file  . Attends Religious Services: Not on file  . Active Member of Clubs or Organizations: Not on file  . Attends Archivist Meetings: Not on file  . Marital  Status: Not on file   Allergies  Allergen Reactions  . Chloraprep One Step [Chlorhexidine Gluconate]   . Oxycodone Rash    Rash all over    Family History  Problem Relation Age of Onset  . Breast cancer Mother 27  . Brain cancer Maternal Uncle   . Leukemia Maternal Grandmother   . Diabetes Maternal Grandfather   . Depression Paternal Grandfather          Current Outpatient Medications (Other):  Marland Kitchen  Diclofenac Sodium 2 % SOLN, Place 2 g onto the skin 2 (two) times daily. .  Esketamine HCl, 56 MG Dose, (SPRAVATO, 56 MG DOSE,) 28 MG/DEVICE SOPK, Place into the nose. .  hydrOXYzine (ATARAX/VISTARIL) 10 MG tablet, TAKE 1 TABLET BY MOUTH THREE TIMES A DAY AS NEEDED .  hyoscyamine (LEVBID) 0.375 MG 12 hr tablet, Take 1 tablet (0.375 mg total) by mouth 2 (two) times daily. Marland Kitchen   lisdexamfetamine (VYVANSE) 40 MG capsule, Take 40 mg by mouth every morning. .  Melatonin 2.5 MG CAPS, Take by mouth. .  Milk Thistle 1000 MG CAPS, Take 1,000 mg by mouth daily. .  Vilazodone HCl 20 MG TABS, Take by mouth. .  Vitamin D, Ergocalciferol, (DRISDOL) 1.25 MG (50000 UT) CAPS capsule, TAKE 1 CAPSULE BY MOUTH EVERY 7 DAYS.    Past medical history, social, surgical and family history all reviewed in electronic medical record.  No pertanent information unless stated regarding to the chief complaint.   Review of Systems:  No headache, visual changes, nausea, vomiting, diarrhea, constipation, dizziness, abdominal pain, skin rash, fevers, chills, night sweats, weight loss, swollen lymph nodes, body aches, joint swelling, muscle aches, chest pain, shortness of breath, mood changes.   Objective  Blood pressure 100/72, height 5\' 3"  (1.6 m).    General: No apparent distress alert and oriented x3 mood and affect normal, dressed appropriately.  HEENT: Pupils equal, extraocular movements intact  Respiratory: Patient's speak in full sentences and does not appear short of breath  Cardiovascular: No lower extremity edema, non tender, no erythema  Skin: Warm dry intact with no signs of infection or rash on extremities or on axial skeleton.  Abdomen: Soft nontender  Neuro: Cranial nerves II through XII are intact, neurovascularly intact in all extremities with 2+ DTRs and 2+ pulses.  Lymph: No lymphadenopathy of posterior or anterior cervical chain or axillae bilaterally.  Gait normal with good balance and coordination.  MSK:  Non tender with full range of motion and good stability and symmetric strength and tone of shoulders, elbows, wrist, hip, knee and ankles bilaterally.  Back exam does have some mild loss of lordosis.  Patient does have tenderness to palpation of paraspinal musculature lumbar spine right greater than left.  Negative straight leg test.  Patient has some limited relating range  of motion lacking last 5 degrees of extension and flexion.  Osteopathic findings T11 extended rotated and side bent left L1 flexed rotated and side bent right Sacrum left on left    Impression and Recommendations:     This case required medical decision making of moderate complexity. The above documentation has been reviewed and is accurate and complete [name], DO       Note: This dictation was prepared with Dragon dictation along with smaller phrase technology. Any transcriptional errors that result from this process are unintentional.

## 2019-11-09 ENCOUNTER — Other Ambulatory Visit: Payer: Self-pay

## 2019-11-09 ENCOUNTER — Encounter: Payer: Self-pay | Admitting: Family Medicine

## 2019-11-09 ENCOUNTER — Ambulatory Visit: Payer: BC Managed Care – PPO | Admitting: Family Medicine

## 2019-11-09 VITALS — BP 100/60 | HR 89 | Ht 63.0 in | Wt 118.0 lb

## 2019-11-09 DIAGNOSIS — G8929 Other chronic pain: Secondary | ICD-10-CM | POA: Diagnosis not present

## 2019-11-09 DIAGNOSIS — M545 Low back pain, unspecified: Secondary | ICD-10-CM

## 2019-11-09 DIAGNOSIS — M999 Biomechanical lesion, unspecified: Secondary | ICD-10-CM | POA: Diagnosis not present

## 2019-11-09 NOTE — Patient Instructions (Addendum)
Good to see you Theragun See me again 4 weeks

## 2019-11-09 NOTE — Assessment & Plan Note (Signed)
Decision today to treat with OMT was based on Physical Exam  After verbal consent patient was treated with HVLA, ME, FPR techniques in  thoracic, lumbar and sacral areas  Patient tolerated the procedure well with improvement in symptoms  Patient given exercises, stretches and lifestyle modifications  See medications in patient instructions if given  Patient will follow up in 4-8 weeks 

## 2019-11-09 NOTE — Progress Notes (Signed)
[facility] [address] [place] Phone: [phone] Subjective:   I Amanda [name] am serving as a Education administrator for Dr. Hulan Saas.  This visit occurred during the SARS-CoV-2 public health emergency.  Safety protocols were in place, including screening questions prior to the visit, additional usage of staff PPE, and extensive cleaning of exam room while observing appropriate contact time as indicated for disinfecting solutions.   I'm seeing this patient by the request  of:  [name], MD  CC: Low back pain follow-up  [id number]  Amanda [name] is a 49 y.o. female coming in with complaint of back pain. Patient states her neck has been bad this week.  Mild tightness recently.  Patient does have improvement with her depression recently.  Being treated by another provider.     Past Medical History:  Diagnosis Date  . Anxiety   . Breast mass 04/2012   left breast  . Complication of anesthesia    states was hard to wake up post-op  . Depression   . Dry cough    since vocal cord surgery  . Sleep related teeth grinding    wears mouth guard at night   Past Surgical History:  Procedure Laterality Date  . AUGMENTATION MAMMAPLASTY Bilateral 2014  . BREAST MASS EXCISION Left 05/26/2012  . CERVIX SURGERY  2005   papilloma  . UMBILICAL HERNIA REPAIR  as a child   UHR  . vocal cord polyp excision  1999   Social History   Socioeconomic History  . Marital status: Married    Spouse name: Not on file  . Number of children: 1  . Years of education: Not on file  . Highest education level: Not on file  Occupational History  . Occupation: Spanish Professor  Tobacco Use  . Smoking status: Former Smoker    Years: 25.00    Types: Cigarettes  . Smokeless tobacco: Never Used  . Tobacco comment: smokes 3 cig./day  Substance and Sexual Activity  . Alcohol use: Yes    Alcohol/week: 0.0 standard drinks    Comment: occasional wine  . Drug  use: No  . Sexual activity: Not on file  Other Topics Concern  . Not on file  Social History Narrative  . Not on file   Social Determinants of Health   Financial Resource Strain:   . Difficulty of Paying Living Expenses: Not on file  Food Insecurity:   . Worried About Charity fundraiser in the Last Year: Not on file  . Ran Out of Food in the Last Year: Not on file  Transportation Needs:   . Lack of Transportation (Medical): Not on file  . Lack of Transportation (Non-Medical): Not on file  Physical Activity:   . Days of Exercise per Week: Not on file  . Minutes of Exercise per Session: Not on file  Stress:   . Feeling of Stress : Not on file  Social Connections:   . Frequency of Communication with Friends and Family: Not on file  . Frequency of Social Gatherings with Friends and Family: Not on file  . Attends Religious Services: Not on file  . Active Member of Clubs or Organizations: Not on file  . Attends Archivist Meetings: Not on file  . Marital Status: Not on file   Allergies  Allergen Reactions  . Chloraprep One Step [Chlorhexidine Gluconate]   . Oxycodone Rash    Rash all over  Family History  Problem Relation Age of Onset  . Breast cancer Mother 25  . Brain cancer Maternal Uncle   . Leukemia Maternal Grandmother   . Diabetes Maternal Grandfather   . Depression Paternal Grandfather          Current Outpatient Medications (Other):  Marland Kitchen  Diclofenac Sodium 2 % SOLN, Place 2 g onto the skin 2 (two) times daily. .  Esketamine HCl, 56 MG Dose, (SPRAVATO, 56 MG DOSE,) 28 MG/DEVICE SOPK, Place into the nose. .  hydrOXYzine (ATARAX/VISTARIL) 10 MG tablet, TAKE 1 TABLET BY MOUTH THREE TIMES A DAY AS NEEDED .  hyoscyamine (LEVBID) 0.375 MG 12 hr tablet, Take 1 tablet (0.375 mg total) by mouth 2 (two) times daily. Marland Kitchen  lisdexamfetamine (VYVANSE) 40 MG capsule, Take 40 mg by mouth every morning. .  Melatonin 2.5 MG CAPS, Take by mouth. .  Milk Thistle 1000  MG CAPS, Take 1,000 mg by mouth daily. .  Vilazodone HCl 20 MG TABS, Take by mouth. .  Vitamin D, Ergocalciferol, (DRISDOL) 1.25 MG (50000 UT) CAPS capsule, TAKE 1 CAPSULE BY MOUTH EVERY 7 DAYS.   Reviewed prior external information including notes and imaging from  primary care provider As well as notes that were available from care everywhere and other healthcare systems.  Past medical history, social, surgical and family history all reviewed in electronic medical record.  No pertanent information unless stated regarding to the chief complaint.   Review of Systems:  No headache, visual changes, nausea, vomiting, diarrhea, constipation, dizziness, abdominal pain, skin rash, fevers, chills, night sweats, weight loss, swollen lymph nodes, body aches, joint swelling, chest pain, shortness of breath, mood changes. POSITIVE muscle aches  Objective  Blood pressure 100/60, pulse 89, height 5\' 3"  (1.6 m), weight 118 lb (53.5 kg), SpO2 98 %.   General: No apparent distress alert and oriented x3 mood and affect normal, dressed appropriately.  HEENT: Pupils equal, extraocular movements intact  Respiratory: Patient's speak in full sentences and does not appear short of breath  Cardiovascular: No lower extremity edema, non tender, no erythema  Skin: Warm dry intact with no signs of infection or rash on extremities or on axial skeleton.  Abdomen: Soft nontender  Neuro: Cranial nerves II through XII are intact, neurovascularly intact in all extremities with 2+ DTRs and 2+ pulses.  Lymph: No lymphadenopathy of posterior or anterior cervical chain or axillae bilaterally.  Gait normal with good balance and coordination.  MSK:  tender with full range of motion and good stability and symmetric strength and tone of shoulders, elbows, wrist, hip, knee and ankles bilaterally.  Low back exam does have some loss of lordosis, tender to palpation of paraspinal musculature lumbar spine left greater than right.  Mild  tightness with Corky Sox test bilaterally.  More pain over the right sacroiliac joint and the left.  Osteopathic findings  T9 extended rotated and side bent left L2 flexed rotated and side bent right Sacrum right on right    Impression and Recommendations:     This case required medical decision making of moderate complexity. The above documentation has been reviewed and is accurate and complete [name], DO       Note: This dictation was prepared with Dragon dictation along with smaller phrase technology. Any transcriptional errors that result from this process are unintentional.

## 2019-11-09 NOTE — Assessment & Plan Note (Signed)
Stable overall.  Patient does have a chronic problem plan I do think it is multifactorial.  Discussed which activities to do which wants to avoid.  Patient should increase activity slowly over the course the next several weeks.  Discussed icing regimen.  Follow-up again in 4 to 8 weeks discussed with patient about social determinants of health again guidance with patient's underlying depression.

## 2019-11-15 ENCOUNTER — Other Ambulatory Visit: Payer: Self-pay | Admitting: Family Medicine

## 2019-12-07 ENCOUNTER — Ambulatory Visit: Payer: BC Managed Care – PPO | Admitting: Family Medicine

## 2019-12-07 ENCOUNTER — Other Ambulatory Visit: Payer: Self-pay

## 2019-12-07 ENCOUNTER — Encounter: Payer: Self-pay | Admitting: Family Medicine

## 2019-12-07 VITALS — BP 104/72 | HR 80 | Ht 63.0 in | Wt 117.0 lb

## 2019-12-07 DIAGNOSIS — M545 Low back pain, unspecified: Secondary | ICD-10-CM

## 2019-12-07 DIAGNOSIS — M999 Biomechanical lesion, unspecified: Secondary | ICD-10-CM | POA: Diagnosis not present

## 2019-12-07 DIAGNOSIS — G8929 Other chronic pain: Secondary | ICD-10-CM

## 2019-12-07 NOTE — Assessment & Plan Note (Signed)
Decision today to treat with OMT was based on Physical Exam  After verbal consent patient was treated with HVLA, ME, FPR techniques in  thoracic, lumbar and sacral areas  Patient tolerated the procedure well with improvement in symptoms  Patient given exercises, stretches and lifestyle modifications  See medications in patient instructions if given  Patient will follow up in 4-8 weeks 

## 2019-12-07 NOTE — Assessment & Plan Note (Signed)
Chronic problem with continued mild pain.  Nothing other is stopping her from activity and is still continuing to workout on a regular basis.  Discussed with patient icing regimen, home exercises, which activities to do which was avoid.  Patient is to increase activity slowly over the course the next several weeks.  Follow-up again in 4 to 8 weeks social determinants of health including patient having difficulty with financial constraints and patient at this moment still trying to figure out what to do with some sickness in her family.

## 2019-12-07 NOTE — Patient Instructions (Signed)
See me again in 4 weeks  

## 2019-12-07 NOTE — Progress Notes (Signed)
[facility] Phone: [phone] Subjective:   [name], am serving as a scribe for Dr. Hulan Saas. This visit occurred during the SARS-CoV-2 public health emergency.  Safety protocols were in place, including screening questions prior to the visit, additional usage of staff PPE, and extensive cleaning of exam room while observing appropriate contact time as indicated for disinfecting solutions.   I'm seeing this patient by the request  of:  Amanda Stains, MD  CC: chronic back pain   [id number]  Amanda [name] is a 49 y.o. female coming in with complaint of back pain. Last seen on 11/09/2019 for OMT. Patient states that her left arm is still bothering her. Also having left gluteal pain that radiates down to her knee.      Past Medical History:  Diagnosis Date  . Anxiety   . Breast mass 04/2012   left breast  . Complication of anesthesia    states was hard to wake up post-op  . Depression   . Dry cough    since vocal cord surgery  . Sleep related teeth grinding    wears mouth guard at night   Past Surgical History:  Procedure Laterality Date  . AUGMENTATION MAMMAPLASTY Bilateral 2014  . BREAST MASS EXCISION Left 05/26/2012  . CERVIX SURGERY  2005   papilloma  . UMBILICAL HERNIA REPAIR  as a child   UHR  . vocal cord polyp excision  1999   Social History   Socioeconomic History  . Marital status: Married    Spouse name: Not on file  . Number of children: 1  . Years of education: Not on file  . Highest education level: Not on file  Occupational History  . Occupation: Spanish Professor  Tobacco Use  . Smoking status: Former Smoker    Years: 25.00    Types: Cigarettes  . Smokeless tobacco: Never Used  . Tobacco comment: smokes 3 cig./day  Substance and Sexual Activity  . Alcohol use: Yes    Alcohol/week: 0.0 standard drinks    Comment: occasional wine  . Drug use: No  . Sexual  activity: Not on file  Other Topics Concern  . Not on file  Social History Narrative  . Not on file   Social Determinants of Health   Financial Resource Strain:   . Difficulty of Paying Living Expenses:   Food Insecurity:   . Worried About Charity fundraiser in the Last Year:   . Arboriculturist in the Last Year:   Transportation Needs:   . Film/video editor (Medical):   Marland Kitchen Lack of Transportation (Non-Medical):   Physical Activity:   . Days of Exercise per Week:   . Minutes of Exercise per Session:   Stress:   . Feeling of Stress :   Social Connections:   . Frequency of Communication with Friends and Family:   . Frequency of Social Gatherings with Friends and Family:   . Attends Religious Services:   . Active Member of Clubs or Organizations:   . Attends Archivist Meetings:   Marland Kitchen Marital Status:    Allergies  Allergen Reactions  . Chloraprep One Step [Chlorhexidine Gluconate]   . Oxycodone Rash    Rash all over    Family History  Problem Relation Age of Onset  . Breast cancer Mother 89  . Brain cancer Maternal Uncle   . Leukemia Maternal Grandmother   .  Diabetes Maternal Grandfather   . Depression Paternal Grandfather          Current Outpatient Medications (Other):  Marland Kitchen  Diclofenac Sodium 2 % SOLN, Place 2 g onto the skin 2 (two) times daily. .  Esketamine HCl, 56 MG Dose, (SPRAVATO, 56 MG DOSE,) 28 MG/DEVICE SOPK, Place into the nose. .  hydrOXYzine (ATARAX/VISTARIL) 10 MG tablet, TAKE 1 TABLET BY MOUTH THREE TIMES A DAY AS NEEDED .  hyoscyamine (LEVBID) 0.375 MG 12 hr tablet, Take 1 tablet (0.375 mg total) by mouth 2 (two) times daily. Marland Kitchen  lisdexamfetamine (VYVANSE) 40 MG capsule, Take 40 mg by mouth every morning. .  Melatonin 2.5 MG CAPS, Take by mouth. .  Milk Thistle 1000 MG CAPS, Take 1,000 mg by mouth daily. .  Vilazodone HCl 20 MG TABS, Take by mouth. .  Vitamin D, Ergocalciferol, (DRISDOL) 1.25 MG (50000 UNIT) CAPS capsule, TAKE 1  CAPSULE BY MOUTH EVERY 7 DAYS.   Reviewed prior external information including notes and imaging from  primary care provider As well as notes that were available from care everywhere and other healthcare systems.  Past medical history, social, surgical and family history all reviewed in electronic medical record.  No pertanent information unless stated regarding to the chief complaint.   Review of Systems:  No headache, visual changes, nausea, vomiting, diarrhea, constipation, dizziness, abdominal pain, skin rash, fevers, chills, night sweats, weight loss, swollen lymph nodes, body aches, joint swelling, chest pain, shortness of breath, mood changes. POSITIVE muscle aches  Objective  Blood pressure 104/72, pulse 80, height 5\' 3"  (1.6 m), weight 117 lb (53.1 kg), SpO2 97 %.   General: No apparent distress alert and oriented x3 mood and affect normal, dressed appropriately.  HEENT: Pupils equal, extraocular movements intact  Respiratory: Patient's speak in full sentences and does not appear short of breath  Cardiovascular: No lower extremity edema, non tender, no erythema  Skin: Warm dry intact with no signs of infection or rash on extremities or on axial skeleton.  Abdomen: Soft nontender  Neuro: Cranial nerves II through XII are intact, neurovascularly intact in all extremities with 2+ DTRs and 2+ pulses.  Lymph: No lymphadenopathy of posterior or anterior cervical chain or axillae bilaterally.  Gait normal with good balance and coordination.  MSK:  Non tender with full range of motion and good stability and symmetric strength and tone of shoulders, elbows, wrist, hip, knee and ankles bilaterally.  Low back pain TTP paraspinal musculature lumbar spine.  Still diffusely noted.  He has tightness overall.  Some mild improvement in range of motion but still some more discomfort with full flexion and full extension.  Mild tightness with Corky Sox test bilaterally.  Out of 5 strength over the lower  extremities.  Osteopathic findings C2 flexed rotated and side bent right C6 flexed rotated and side bent left T3 extended rotated and side bent right inhaled third rib T6 extended rotated and side bent left L4 flexed rotated and side bent left  Sacrum right on right    Impression and Recommendations:     This case required medical decision making of moderate complexity. The above documentation has been reviewed and is accurate and complete [name], DO       Note: This dictation was prepared with Dragon dictation along with smaller phrase technology. Any transcriptional errors that result from this process are unintentional.

## 2019-12-09 ENCOUNTER — Ambulatory Visit: Payer: BC Managed Care – PPO

## 2020-01-09 ENCOUNTER — Ambulatory Visit: Payer: BC Managed Care – PPO | Admitting: Family Medicine

## 2020-01-09 DIAGNOSIS — Z0289 Encounter for other administrative examinations: Secondary | ICD-10-CM

## 2020-01-09 NOTE — Assessment & Plan Note (Deleted)
Chronic problem with exacerbation.  Doing relatively well now with the medications.  Do feel tired patient's other social determinants of health including generalized anxiety disorder can be contributing to some of the aches and pains.  Continue the same medication follow-up again in 4 to 8 weeks

## 2020-01-09 NOTE — Progress Notes (Deleted)
[place] [address] [place] Phone: [phone] Subjective:    I'm seeing this patient by the request  of:  [name], MD  CC:   [id number]  Amanda [name] is a 49 y.o. female coming in with complaint of back pain. Last seen on 12/07/2019 for OMT. Patient states      Past Medical History:  Diagnosis Date  . Anxiety   . Breast mass 04/2012   left breast  . Complication of anesthesia    states was hard to wake up post-op  . Depression   . Dry cough    since vocal cord surgery  . Sleep related teeth grinding    wears mouth guard at night   Past Surgical History:  Procedure Laterality Date  . AUGMENTATION MAMMAPLASTY Bilateral 2014  . BREAST MASS EXCISION Left 05/26/2012  . CERVIX SURGERY  2005   papilloma  . UMBILICAL HERNIA REPAIR  as a child   UHR  . vocal cord polyp excision  1999   Social History   Socioeconomic History  . Marital status: Married    Spouse name: Not on file  . Number of children: 1  . Years of education: Not on file  . Highest education level: Not on file  Occupational History  . Occupation: Spanish Professor  Tobacco Use  . Smoking status: Former Smoker    Years: 25.00    Types: Cigarettes  . Smokeless tobacco: Never Used  . Tobacco comment: smokes 3 cig./day  Substance and Sexual Activity  . Alcohol use: Yes    Alcohol/week: 0.0 standard drinks    Comment: occasional wine  . Drug use: No  . Sexual activity: Not on file  Other Topics Concern  . Not on file  Social History Narrative  . Not on file   Social Determinants of Health   Financial Resource Strain:   . Difficulty of Paying Living Expenses:   Food Insecurity:   . Worried About Charity fundraiser in the Last Year:   . Arboriculturist in the Last Year:   Transportation Needs:   . Film/video editor (Medical):   Marland Kitchen Lack of Transportation (Non-Medical):   Physical Activity:   . Days of Exercise per Week:     . Minutes of Exercise per Session:   Stress:   . Feeling of Stress :   Social Connections:   . Frequency of Communication with Friends and Family:   . Frequency of Social Gatherings with Friends and Family:   . Attends Religious Services:   . Active Member of Clubs or Organizations:   . Attends Archivist Meetings:   Marland Kitchen Marital Status:    Allergies  Allergen Reactions  . Chloraprep One Step [Chlorhexidine Gluconate]   . Oxycodone Rash    Rash all over    Family History  Problem Relation Age of Onset  . Breast cancer Mother 15  . Brain cancer Maternal Uncle   . Leukemia Maternal Grandmother   . Diabetes Maternal Grandfather   . Depression Paternal Grandfather          Current Outpatient Medications (Other):  Marland Kitchen  Diclofenac Sodium 2 % SOLN, Place 2 g onto the skin 2 (two) times daily. .  Esketamine HCl, 56 MG Dose, (SPRAVATO, 56 MG DOSE,) 28 MG/DEVICE SOPK, Place into the nose. .  hydrOXYzine (ATARAX/VISTARIL) 10 MG tablet, TAKE 1 TABLET BY MOUTH THREE TIMES A DAY AS NEEDED .  hyoscyamine (LEVBID) 0.375 MG 12 hr tablet, Take 1 tablet (0.375 mg total) by mouth 2 (two) times daily. Marland Kitchen  lisdexamfetamine (VYVANSE) 40 MG capsule, Take 40 mg by mouth every morning. .  Melatonin 2.5 MG CAPS, Take by mouth. .  Milk Thistle 1000 MG CAPS, Take 1,000 mg by mouth daily. .  Vilazodone HCl 20 MG TABS, Take by mouth. .  Vitamin D, Ergocalciferol, (DRISDOL) 1.25 MG (50000 UNIT) CAPS capsule, TAKE 1 CAPSULE BY MOUTH EVERY 7 DAYS.   Reviewed prior external information including notes and imaging from  primary care provider As well as notes that were available from care everywhere and other healthcare systems.  Past medical history, social, surgical and family history all reviewed in electronic medical record.  No pertanent information unless stated regarding to the chief complaint.   Review of Systems:  No headache, visual changes, nausea, vomiting, diarrhea, constipation,  dizziness, abdominal pain, skin rash, fevers, chills, night sweats, weight loss, swollen lymph nodes, body aches, joint swelling, chest pain, shortness of breath, mood changes. POSITIVE muscle aches  Objective  There were no vitals taken for this visit.   General: No apparent distress alert and oriented x3 mood and affect normal, dressed appropriately.  HEENT: Pupils equal, extraocular movements intact  Respiratory: Patient's speak in full sentences and does not appear short of breath  Cardiovascular: No lower extremity edema, non tender, no erythema  Neuro: Cranial nerves II through XII are intact, neurovascularly intact in all extremities with 2+ DTRs and 2+ pulses.  Gait normal with good balance and coordination.  MSK:  Non tender with full range of motion and good stability and symmetric strength and tone of shoulders, elbows, wrist, hip, knee and ankles bilaterally.     Impression and Recommendations:     This case required medical decision making of moderate complexity. The above documentation has been reviewed and is accurate and complete Jacqualin Combes       Note: This dictation was prepared with Dragon dictation along with smaller phrase technology. Any transcriptional errors that result from this process are unintentional.

## 2020-01-15 ENCOUNTER — Other Ambulatory Visit: Payer: Self-pay

## 2020-01-15 MED ORDER — VITAMIN D (ERGOCALCIFEROL) 1.25 MG (50000 UNIT) PO CAPS: ORAL_CAPSULE | ORAL | 0 refills | Status: DC

## 2020-01-19 ENCOUNTER — Telehealth: Payer: Self-pay | Admitting: Gastroenterology

## 2020-01-19 NOTE — Telephone Encounter (Signed)
Please send prescription for dicyclomine 10 mg every 8 hours as needed X 90 tablets with 3 refills.  Thank you

## 2020-01-22 ENCOUNTER — Other Ambulatory Visit: Payer: Self-pay

## 2020-01-22 MED ORDER — DICYCLOMINE HCL 10 MG PO CAPS: 10.0000 mg | ORAL_CAPSULE | Freq: Three times a day (TID) | ORAL | 3 refills | Status: DC | PRN

## 2020-01-22 NOTE — Telephone Encounter (Signed)
Spoke with patient.  Pt advised to pick up new prescription.

## 2020-01-23 ENCOUNTER — Ambulatory Visit: Payer: BC Managed Care – PPO | Admitting: Family Medicine

## 2020-01-24 ENCOUNTER — Ambulatory Visit (INDEPENDENT_AMBULATORY_CARE_PROVIDER_SITE_OTHER): Payer: BC Managed Care – PPO

## 2020-01-24 ENCOUNTER — Other Ambulatory Visit: Payer: Self-pay | Admitting: Family Medicine

## 2020-01-24 ENCOUNTER — Ambulatory Visit: Payer: BC Managed Care – PPO | Admitting: Family Medicine

## 2020-01-24 ENCOUNTER — Other Ambulatory Visit: Payer: Self-pay

## 2020-01-24 ENCOUNTER — Encounter: Payer: Self-pay | Admitting: Family Medicine

## 2020-01-24 VITALS — BP 102/72 | HR 92 | Ht 63.0 in | Wt 121.0 lb

## 2020-01-24 DIAGNOSIS — G5762 Lesion of plantar nerve, left lower limb: Secondary | ICD-10-CM | POA: Diagnosis not present

## 2020-01-24 DIAGNOSIS — M25571 Pain in right ankle and joints of right foot: Secondary | ICD-10-CM

## 2020-01-24 DIAGNOSIS — G5601 Carpal tunnel syndrome, right upper limb: Secondary | ICD-10-CM

## 2020-01-24 DIAGNOSIS — M545 Low back pain, unspecified: Secondary | ICD-10-CM

## 2020-01-24 DIAGNOSIS — G8929 Other chronic pain: Secondary | ICD-10-CM

## 2020-01-24 DIAGNOSIS — M999 Biomechanical lesion, unspecified: Secondary | ICD-10-CM

## 2020-01-24 MED ORDER — HYDROXYZINE HCL 10 MG PO TABS: 10.0000 mg | ORAL_TABLET | Freq: Three times a day (TID) | ORAL | 0 refills | Status: DC | PRN

## 2020-01-24 MED ORDER — VITAMIN D (ERGOCALCIFEROL) 1.25 MG (50000 UNIT) PO CAPS: 50000.0000 [IU] | ORAL_CAPSULE | ORAL | 0 refills | Status: DC

## 2020-01-24 NOTE — Assessment & Plan Note (Addendum)

## 2020-01-24 NOTE — Patient Instructions (Signed)
Meds called in to pharmacy Injected foot and wrist today See me when you get back! Safe travels

## 2020-01-24 NOTE — Assessment & Plan Note (Signed)
Injection given today, tolerated procedure well, discussed icing regimen and home exercises, which activities to do which wants to avoid.  Patient is to increase activity slowly.  Discussed proper shoes and over-the-counter orthotics.  Unable to do custom orthotics at this time.

## 2020-01-24 NOTE — Progress Notes (Signed)
[place] [facility] [place] Phone: [phone] Subjective:   [name], am serving as a scribe for Dr. Hulan Saas. This visit occurred during the SARS-CoV-2 public health emergency.  Safety protocols were in place, including screening questions prior to the visit, additional usage of staff PPE, and extensive cleaning of exam room while observing appropriate contact time as indicated for disinfecting solutions.   I'm seeing this patient by the request  of:  [name], MD  CC: Back pain, wrist pain, foot pain  [id number]  Amanda [name] is a 49 y.o. female coming in with complaint of back pain. Last seen on 12/07/2019 for OMT. Patient states that she has been having left glute pain and left shoulder pain.   Would like right wrist injection and right ankle injection.  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      Past Medical History:  Diagnosis Date  . Anxiety   . Breast mass 04/2012   left breast  . Complication of anesthesia    states was hard to wake up post-op  . Depression   . Dry cough    since vocal cord surgery  . Sleep related teeth grinding    wears mouth guard at night   Past Surgical History:  Procedure Laterality Date  . AUGMENTATION MAMMAPLASTY Bilateral 2014  . BREAST MASS EXCISION Left 05/26/2012  . CERVIX SURGERY  2005   papilloma  . UMBILICAL HERNIA REPAIR  as a child   UHR  . vocal cord polyp excision  1999   Social History   Socioeconomic History  . Marital status: Married    Spouse name: Not on file  . Number of children: 1  . Years of education: Not on file  . Highest education level: Not on file  Occupational History  . Occupation: Spanish Professor  Tobacco Use  . Smoking status: Former Smoker    Years: 25.00    Types: Cigarettes  . Smokeless tobacco: Never Used  . Tobacco comment: smokes 3 cig./day  Substance and Sexual  Activity  . Alcohol use: Yes    Alcohol/week: 0.0 standard drinks    Comment: occasional wine  . Drug use: No  . Sexual activity: Not on file  Other Topics Concern  . Not on file  Social History Narrative  . Not on file   Social Determinants of Health   Financial Resource Strain:   . Difficulty of Paying Living Expenses:   Food Insecurity:   . Worried About Charity fundraiser in the Last Year:   . Arboriculturist in the Last Year:   Transportation Needs:   . Film/video editor (Medical):   Marland Kitchen Lack of Transportation (Non-Medical):   Physical Activity:   . Days of Exercise per Week:   . Minutes of Exercise per Session:   Stress:   . Feeling of Stress :   Social Connections:   . Frequency of Communication with Friends and Family:   . Frequency of Social Gatherings with Friends and Family:   . Attends Religious Services:   . Active Member of Clubs or Organizations:   . Attends Archivist Meetings:   Marland Kitchen Marital Status:    Allergies  Allergen Reactions  . Chloraprep One Step [Chlorhexidine Gluconate]   . Oxycodone Rash    Rash all over    Family History  Problem Relation  Age of Onset  . Breast cancer Mother 31  . Brain cancer Maternal Uncle   . Leukemia Maternal Grandmother   . Diabetes Maternal Grandfather   . Depression Paternal Grandfather          Current Outpatient Medications (Other):  Marland Kitchen  Diclofenac Sodium 2 % SOLN, Place 2 g onto the skin 2 (two) times daily. Marland Kitchen  dicyclomine (BENTYL) 10 MG capsule, Take 1 capsule (10 mg total) by mouth every 8 (eight) hours as needed for spasms. .  Esketamine HCl, 56 MG Dose, (SPRAVATO, 56 MG DOSE,) 28 MG/DEVICE SOPK, Place into the nose. .  hydrOXYzine (ATARAX/VISTARIL) 10 MG tablet, TAKE 1 TABLET BY MOUTH THREE TIMES A DAY AS NEEDED .  lisdexamfetamine (VYVANSE) 40 MG capsule, Take 40 mg by mouth every morning. .  Melatonin 2.5 MG CAPS, Take by mouth. .  Milk Thistle 1000 MG CAPS, Take 1,000 mg by mouth  daily. .  Vilazodone HCl 20 MG TABS, Take by mouth. .  Vitamin D, Ergocalciferol, (DRISDOL) 1.25 MG (50000 UNIT) CAPS capsule, Take one capsule by mouth every 7 days. .  hydrOXYzine (ATARAX/VISTARIL) 10 MG tablet, Take 1 tablet (10 mg total) by mouth 3 (three) times daily as needed. .  Vitamin D, Ergocalciferol, (DRISDOL) 1.25 MG (50000 UNIT) CAPS capsule, Take 1 capsule (50,000 Units total) by mouth every 7 (seven) days.   Reviewed prior external information including notes and imaging from  primary care provider As well as notes that were available from care everywhere and other healthcare systems.  Past medical history, social, surgical and family history all reviewed in electronic medical record.  No pertanent information unless stated regarding to the chief complaint.   Review of Systems:  No headache, visual changes, nausea, vomiting, diarrhea, constipation, dizziness, abdominal pain, skin rash, fevers, chills, night sweats, weight loss, swollen lymph nodes, body aches, joint swelling, chest pain, shortness of breath, mood changes. POSITIVE muscle aches  Objective  Blood pressure 102/72, pulse 92, height 5\' 3"  (1.6 m), weight 121 lb (54.9 kg), SpO2 97 %.   General: No apparent distress alert and oriented x3 mood and affect normal, dressed appropriately.  HEENT: Pupils equal, extraocular movements intact  Respiratory: Patient's speak in full sentences and does not appear short of breath  Cardiovascular: No lower extremity edema, non tender, no erythema  Neuro: Cranial nerves II through XII are intact, neurovascularly intact in all extremities with 2+ DTRs and 2+ pulses.  Gait normal with good balance and coordination.  MSK: Neck exam does have some tightness noted in the paraspinal musculature.  Negative Spurling's.  Tightness in the lumbar spine.  Positive Faber on the right side.  Negative straight leg test.  Right wrist exam shows the patient still has a mild positive Phalen's and  a positive Tinel's sign.  Strength.  No thenar eminence wasting.  Right foot has positive squeeze test noted.  Patient does have severe tenderness between the first and second toes  Osteopathic findings   T9 extended rotated and side bent left L2 flexed rotated and side bent right Sacrum right on right  Procedure: Real-time Ultrasound Guided Injection of right carpal tunnel Device: GE Logiq Q7  Ultrasound guided injection is preferred based studies that show increased duration, increased effect, greater accuracy, decreased procedural pain, increased response rate with ultrasound guided versus blind injection.  Verbal informed consent obtained.  Time-out conducted.  Noted no overlying erythema, induration, or other signs of local infection.  Skin prepped in a sterile fashion.  Local anesthesia: Topical Ethyl chloride.  With sterile technique and under real time ultrasound guidance:  median nerve visualized.  23g 5/8 inch needle inserted distal to proximal approach into nerve sheath. Pictures taken nfor needle placement. Patient did have injection of 2 cc of 1% lidocaine, 1 cc of 0.5% Marcaine, and 1 cc of Kenalog 40 mg/dL. Completed without difficulty  Pain immediately resolved suggesting accurate placement of the medication.  Advised to call if fevers/chills, erythema, induration, drainage, or persistent bleeding.  Images permanently stored and available for review in the ultrasound unit.  Impression: Technically successful ultrasound guided injection.  Procedure: Real-time Ultrasound Guided Injection of right foot neuroma Device: GE Logiq Q7 Ultrasound guided injection is preferred based studies that show increased duration, increased effect, greater accuracy, decreased procedural pain, increased response rate, and decreased cost with ultrasound guided versus blind injection.  Verbal informed consent obtained.  Time-out conducted.  Noted no overlying erythema, induration, or other  signs of local infection.  Skin prepped in a sterile fashion.  Local anesthesia: Topical Ethyl chloride.  With sterile technique and under real time ultrasound guidance: With a 25-gauge half inch needle injected with 0.5 cc of 0.5% Marcaine and 0.5 cc of Kenalog 40 mg/mL Completed without difficulty  Pain immediately resolved suggesting accurate placement of the medication.  Advised to call if fevers/chills, erythema, induration, drainage, or persistent bleeding.  Images permanently stored and available for review in the ultrasound unit.  Impression: Technically successful ultrasound guided injection.     Impression and Recommendations:     This case required medical decision making of moderate complexity. The above documentation has been reviewed and is accurate and complete [name], DO       Note: This dictation was prepared with Dragon dictation along with smaller phrase technology. Any transcriptional errors that result from this process are unintentional.

## 2020-01-24 NOTE — Assessment & Plan Note (Signed)
Repeat injection given today.  Tolerated procedure well.  Patient declined refill of gabapentin today.  Discussed icing regimen.  Discussed bracing.  Follow-up again in 3 to 4 months

## 2020-01-24 NOTE — Assessment & Plan Note (Signed)
Chronic problem with exacerbation.  Patient will be leaving the country for the next 3 months.  Attempted osteopathic manipulation again.  Patient does not want any other big changes in medications secondary to patient currently doing well with her antidepressant medications.  Follow-up again in 3 to 4 months

## 2020-02-22 ENCOUNTER — Encounter: Payer: Self-pay | Admitting: Family Medicine

## 2020-04-16 ENCOUNTER — Other Ambulatory Visit: Payer: Self-pay | Admitting: Family Medicine

## 2020-04-17 ENCOUNTER — Other Ambulatory Visit: Payer: Self-pay | Admitting: Gastroenterology

## 2020-05-26 ENCOUNTER — Encounter: Payer: Self-pay | Admitting: Family Medicine

## 2020-05-29 NOTE — Progress Notes (Signed)
[place] Phone: [phone] Subjective:   [name], am serving as a scribe for Dr. Hulan Saas. This visit occurred during the SARS-CoV-2 public health emergency.  Safety protocols were in place, including screening questions prior to the visit, additional usage of staff PPE, and extensive cleaning of exam room while observing appropriate contact time as indicated for disinfecting solutions.   I'm seeing this patient by the request  of:  [name], MD  CC: Back pain and neck pain follow-up  [id number]  Amanda [name] is a 49 y.o. female coming in with complaint of back and neck pain. OMT 01/24/2020. 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 Patient states overall doing relatively well.  Has been taking different medications but no longer taking anything for pain on a regular basis  Medications patient has been prescribed: Viibryd, Atarax, Vit D  Taking: No         Reviewed prior external information including notes and imaging from previsou exam, outside providers and external EMR if available.   As well as notes that were available from care everywhere and other healthcare systems.  Past medical history, social, surgical and family history all reviewed in electronic medical record.  No pertanent information unless stated regarding to the chief complaint.   Past Medical History:  Diagnosis Date  . Anxiety   . Breast mass 04/2012   left breast  . Complication of anesthesia    states was hard to wake up post-op  . Depression   . Dry cough    since vocal cord surgery  . Sleep related teeth grinding    wears mouth guard at night    Allergies  Allergen Reactions  . Chloraprep One Step [Chlorhexidine Gluconate]   . Oxycodone Rash    Rash all over      Review of Systems:  No headache, visual changes, nausea, vomiting, diarrhea,  constipation, dizziness, abdominal pain, skin rash, fevers, chills, night sweats, weight loss, swollen lymph nodes, , joint swelling, chest pain, shortness of breath, mood changes. POSITIVE muscle aches, body aches  Objective  Blood pressure 90/62, pulse 76, height 5\' 3"  (1.6 m), weight 124 lb (56.2 kg), SpO2 98 %.   General: No apparent distress alert and oriented x3 mood and affect normal, dressed appropriately.  HEENT: Pupils equal, extraocular movements intact  Respiratory: Patient's speak in full sentences and does not appear short of breath  Cardiovascular: No lower extremity edema, non tender, no erythema  Neuro: Cranial nerves II through XII are intact, neurovascularly intact in all extremities with 2+ DTRs and 2+ pulses.  Gait normal with good balance and coordination.  MSK:  Non tender with full range of motion and good stability and symmetric strength and tone of shoulders, elbows, wrist, hip, knee and ankles bilaterally.  Back - Normal skin, Spine with normal alignment and no deformity.  No tenderness to vertebral process palpation.  Paraspinous muscles are not tender and without spasm.   Range of motion is full at neck and lumbar sacral regions  Osteopathic findings  C2 flexed rotated and side bent right C7 flexed rotated and side bent left T3 extended rotated and side bent right inhaled rib T8 extended rotated and side bent left L4 flexed rotated and side bent left Sacrum right on right       Assessment and Plan:  Low back pain Chronic, mild exacerbation, discussed the  different medications again the patient wants to continue with conservative therapy.  Follow-up with me again in 6 to 8 weeks    Nonallopathic problems  Decision today to treat with OMT was based on Physical Exam  After verbal consent patient was treated with HVLA, ME, FPR techniques in  thoracic, lumbar, and sacral  areas  Patient tolerated the procedure well with improvement in symptoms  Patient  given exercises, stretches and lifestyle modifications  See medications in patient instructions if given  Patient will follow up in 4-8 weeks      The above documentation has been reviewed and is accurate and complete [name], DO       Note: This dictation was prepared with Dragon dictation along with smaller phrase technology. Any transcriptional errors that result from this process are unintentional.

## 2020-05-30 ENCOUNTER — Encounter: Payer: Self-pay | Admitting: Family Medicine

## 2020-05-30 ENCOUNTER — Ambulatory Visit: Payer: BC Managed Care – PPO | Admitting: Family Medicine

## 2020-05-30 ENCOUNTER — Other Ambulatory Visit: Payer: Self-pay

## 2020-05-30 VITALS — BP 90/62 | HR 76 | Ht 63.0 in | Wt 124.0 lb

## 2020-05-30 DIAGNOSIS — M999 Biomechanical lesion, unspecified: Secondary | ICD-10-CM

## 2020-05-30 DIAGNOSIS — G8929 Other chronic pain: Secondary | ICD-10-CM

## 2020-05-30 DIAGNOSIS — M545 Low back pain, unspecified: Secondary | ICD-10-CM

## 2020-05-30 NOTE — Assessment & Plan Note (Signed)
Chronic, mild exacerbation, discussed the different medications again the patient wants to continue with conservative therapy.  Follow-up with me again in 6 to 8 weeks

## 2020-06-26 ENCOUNTER — Other Ambulatory Visit: Payer: Self-pay | Admitting: Family Medicine

## 2020-07-09 ENCOUNTER — Encounter: Payer: Self-pay | Admitting: Gastroenterology

## 2020-07-09 ENCOUNTER — Ambulatory Visit: Payer: BC Managed Care – PPO | Admitting: Gastroenterology

## 2020-07-09 VITALS — BP 120/60 | HR 88 | Ht 63.0 in | Wt 121.0 lb

## 2020-07-09 DIAGNOSIS — Z1211 Encounter for screening for malignant neoplasm of colon: Secondary | ICD-10-CM

## 2020-07-09 DIAGNOSIS — K644 Residual hemorrhoidal skin tags: Secondary | ICD-10-CM

## 2020-07-09 DIAGNOSIS — K649 Unspecified hemorrhoids: Secondary | ICD-10-CM

## 2020-07-09 MED ORDER — SUPREP BOWEL PREP KIT 17.5-3.13-1.6 GM/177ML PO SOLN: 1.0000 | Freq: Once | ORAL | 0 refills | Status: AC

## 2020-07-09 NOTE — Patient Instructions (Signed)
You have been scheduled for a colonoscopy. Please follow written instructions given to you at your visit today.  Please pick up your prep supplies at the pharmacy within the next 1-3 days. If you use inhalers (even only as needed), please bring them with you on the day of your procedure.   We will refer you to Renelda Mom, MD and contact you with that appointment  Due to recent changes in healthcare laws, you may see the results of your imaging and laboratory studies on MyChart before your provider has had a chance to review them.  We understand that in some cases there may be results that are confusing or concerning to you. Not all laboratory results come back in the same time frame and the provider may be waiting for multiple results in order to interpret others.  Please give [place] 48 hours in order for your provider to thoroughly review all the results before contacting the office for clarification of your results.   If you are age 74 or older, your body mass index should be between 23-30. Your Body mass index is 21.43 kg/m. If this is out of the aforementioned range listed, please consider follow up with your Primary Care Provider.  If you are age 82 or younger, your body mass index should be between 19-25. Your Body mass index is 21.43 kg/m. If this is out of the aformentioned range listed, please consider follow up with your Primary Care Provider.    I appreciate the  opportunity to care for you  Thank You   Harl Bowie , MD

## 2020-07-09 NOTE — Progress Notes (Signed)
Amanda [name]    [id number]    11-Mar-1971  Primary Care Physician:White, Caren Griffins, MD  Referring Physician: [name], MD [place],  [place] [address]   Chief complaint:  Hemorrhoids, rectal prolapse  HPI:  49 year old very pleasant female here for follow-up visit with complaints of symptomatic hemorrhoids  She feels the hemorrhoids prolapse out with every bowel movement and she is unable to get clean, feels it is interfering with her personal hygiene  Denies any rectal bleeding  She continues to have intermittent abdominal bloating and generalized discomfort but overall better compared to prior  Negative for celiac disease  No family history of GI malignancy, IBD or celiac  Outpatient Encounter Medications as of 07/09/2020  Medication Sig  . desvenlafaxine (PRISTIQ) 50 MG 24 hr tablet 50 mg daily.  . hydrOXYzine (ATARAX/VISTARIL) 10 MG tablet TAKE 1 TABLET BY MOUTH THREE TIMES A DAY AS NEEDED  . hyoscyamine (LEVBID) 0.375 MG 12 hr tablet Take 1 tablet by mouth every morning.  . Vitamin D, Ergocalciferol, (DRISDOL) 1.25 MG (50000 UNIT) CAPS capsule Take one capsule by mouth every 7 days.  Marland Kitchen dicyclomine (BENTYL) 10 MG capsule TAKE 1 CAPSULE (10 MG TOTAL) BY MOUTH EVERY 8 (EIGHT) HOURS AS NEEDED FOR SPASMS. (Patient not taking: Reported on 07/09/2020)  . lisdexamfetamine (VYVANSE) 40 MG capsule Take 40 mg by mouth every morning. (Patient not taking: Reported on 07/09/2020)  . Milk Thistle 1000 MG CAPS Take 1,000 mg by mouth daily. (Patient not taking: Reported on 07/09/2020)  . Vilazodone HCl 20 MG TABS Take by mouth.  . [DISCONTINUED] Diclofenac Sodium 2 % SOLN Place 2 g onto the skin 2 (two) times daily. (Patient not taking: Reported on 07/09/2020)  . [DISCONTINUED] Esketamine HCl, 56 MG Dose, (SPRAVATO, 56 MG DOSE,) 28 MG/DEVICE SOPK Place into the nose. (Patient not taking: Reported on 07/09/2020)  . [DISCONTINUED] hydrOXYzine  (ATARAX/VISTARIL) 10 MG tablet TAKE 1 TABLET BY MOUTH THREE TIMES A DAY AS NEEDED  . [DISCONTINUED] Melatonin 2.5 MG CAPS Take by mouth. (Patient not taking: Reported on 07/09/2020)  . [DISCONTINUED] Vitamin D, Ergocalciferol, (DRISDOL) 1.25 MG (50000 UNIT) CAPS capsule TAKE 1 CAPSULE (50,000 UNITS TOTAL) BY MOUTH EVERY 7 (SEVEN) DAYS.   No facility-administered encounter medications on file as of 07/09/2020.    Allergies as of 07/09/2020 - Review Complete 07/09/2020  Allergen Reaction Noted  . Chloraprep one step [chlorhexidine gluconate]  06/02/2012  . Oxycodone Rash 05/30/2012  . Sulfamethoxazole Nausea And Vomiting 08/03/2017  . Vitamin e  07/09/2020    Past Medical History:  Diagnosis Date  . Anxiety   . Breast mass 04/2012   left breast  . Complication of anesthesia    states was hard to wake up post-op  . Depression   . Dry cough    since vocal cord surgery  . Sleep related teeth grinding    wears mouth guard at night    Past Surgical History:  Procedure Laterality Date  . AUGMENTATION MAMMAPLASTY Bilateral 2014  . BREAST MASS EXCISION Left 05/26/2012  . CERVIX SURGERY  2005   papilloma  . UMBILICAL HERNIA REPAIR  as a child   UHR  . vocal cord polyp excision  1999    Family History  Problem Relation Age of Onset  . Breast cancer Mother 44  . Brain cancer Maternal Uncle   . Leukemia Maternal Grandmother   . Diabetes Maternal Grandfather   .  Depression Paternal Grandfather     Social History   Socioeconomic History  . Marital status: Married    Spouse name: Not on file  . Number of children: 1  . Years of education: Not on file  . Highest education level: Not on file  Occupational History  . Occupation: Spanish Professor  Tobacco Use  . Smoking status: Current Every Day Smoker    Years: 25.00    Types: Cigarettes  . Smokeless tobacco: Never Used  . Tobacco comment: smokes 3 cig./day  Substance and Sexual Activity  . Alcohol use: Yes     Alcohol/week: 0.0 standard drinks    Comment: occasional wine  . Drug use: No  . Sexual activity: Not on file  Other Topics Concern  . Not on file  Social History Narrative  . Not on file   Social Determinants of Health   Financial Resource Strain:   . Difficulty of Paying Living Expenses: Not on file  Food Insecurity:   . Worried About Charity fundraiser in the Last Year: Not on file  . Ran Out of Food in the Last Year: Not on file  Transportation Needs:   . Lack of Transportation (Medical): Not on file  . Lack of Transportation (Non-Medical): Not on file  Physical Activity:   . Days of Exercise per Week: Not on file  . Minutes of Exercise per Session: Not on file  Stress:   . Feeling of Stress : Not on file  Social Connections:   . Frequency of Communication with Friends and Family: Not on file  . Frequency of Social Gatherings with Friends and Family: Not on file  . Attends Religious Services: Not on file  . Active Member of Clubs or Organizations: Not on file  . Attends Archivist Meetings: Not on file  . Marital Status: Not on file  Intimate Partner Violence:   . Fear of Current or Ex-Partner: Not on file  . Emotionally Abused: Not on file  . Physically Abused: Not on file  . Sexually Abused: Not on file      Review of systems: All other review of systems negative except as mentioned in the HPI.   Physical Exam: Vitals:   07/09/20 1552  BP: 120/60  Pulse: 88  SpO2: 98%   Body mass index is 21.43 kg/m. Gen:      No acute distress HEENT:  sclera anicteric Abd:      soft, non-tender; no palpable masses, no distension Ext:    No edema Neuro: alert and oriented x 3 Psych: normal mood and affect  Data Reviewed:  Reviewed labs, radiology imaging, old records and pertinent past GI work up   Assessment and Plan/Recommendations:  49 year old female with complaints of prolapsed hemorrhoids and generalized abdominal bloating  Due for colorectal  cancer screening, schedule for colonoscopy The risks and benefits as well as alternatives of endoscopic procedure(s) have been discussed and reviewed. All questions answered. The patient agrees to proceed.  Abdominal bloating and discomfort secondary to irritable bowel syndrome Continue with dietary and lifestyle modifications  On rectal exam she has a grade 2-3 internal hemorrhoids and lax pelvic floor with partial prolapse of rectum, easily reducible She also has external skin tags  Patient would like to have the skin tags removed along with hemorrhoidectomy  Discussed pelvic floor physical therapy, Kegel exercises but patient does not think she will be able to do them consistently.  We will send referral to Dr. Jennette Bill at  [facility] for evaluation and hemorrhoidectomy as needed  This visit required 30 minutes of patient care (this includes precharting, chart review, review of results, face-to-face time used for counseling as well as treatment plan and follow-up. The patient was provided an opportunity to ask questions and all were answered. The patient agreed with the plan and demonstrated an understanding of the instructions.  Damaris Hippo , MD    CC: [name], MD

## 2020-07-11 ENCOUNTER — Encounter: Payer: Self-pay | Admitting: Family Medicine

## 2020-07-11 ENCOUNTER — Telehealth: Payer: Self-pay | Admitting: *Deleted

## 2020-07-11 ENCOUNTER — Ambulatory Visit: Payer: BC Managed Care – PPO | Admitting: Family Medicine

## 2020-07-11 ENCOUNTER — Other Ambulatory Visit: Payer: Self-pay

## 2020-07-11 VITALS — BP 100/72 | HR 101 | Ht 63.0 in | Wt 122.0 lb

## 2020-07-11 DIAGNOSIS — M545 Low back pain, unspecified: Secondary | ICD-10-CM | POA: Diagnosis not present

## 2020-07-11 DIAGNOSIS — M999 Biomechanical lesion, unspecified: Secondary | ICD-10-CM | POA: Diagnosis not present

## 2020-07-11 DIAGNOSIS — G8929 Other chronic pain: Secondary | ICD-10-CM | POA: Diagnosis not present

## 2020-07-11 NOTE — Telephone Encounter (Signed)
Patient needs referral to Renelda Mom for removal of skin tags   Waiting on note to be finished   Phone number [phone]

## 2020-07-11 NOTE — Assessment & Plan Note (Signed)
Still mild discomfort overall.  [birth year] patient's birthday.  We discussed the other medication but seems to be doing much better overall.  Responds well to manipulation, follow-up again in 6 weeks

## 2020-07-11 NOTE — Progress Notes (Signed)
[place] Phone: [phone] Subjective:   [name], am serving as a scribe for Dr. Hulan Saas. This visit occurred during the SARS-CoV-2 public health emergency.  Safety protocols were in place, including screening questions prior to the visit, additional usage of staff PPE, and extensive cleaning of exam room while observing appropriate contact time as indicated for disinfecting solutions.   I'm seeing this patient by the request  of:  [name], MD  CC: Low back pain follow-up  [id number]  KISHA MESSMAN is a 49 y.o. female coming in with complaint of back and neck pain. OMT 05/30/2020. Patient states that she has been exercising more recently and her back pain has improved.  Has been walking a significant amount or and feels like he is making progress.  Not using much of the medications at the moment.  Has been very active.          Reviewed prior external information including notes and imaging from previsou exam, outside providers and external EMR if available.   As well as notes that were available from care everywhere and other healthcare systems.  Past medical history, social, surgical and family history all reviewed in electronic medical record.  No pertanent information unless stated regarding to the chief complaint.   Past Medical History:  Diagnosis Date  . Anxiety   . Breast mass 04/2012   left breast  . Complication of anesthesia    states was hard to wake up post-op  . Depression   . Dry cough    since vocal cord surgery  . Sleep related teeth grinding    wears mouth guard at night    Allergies  Allergen Reactions  . Chloraprep One Step [Chlorhexidine Gluconate]   . Oxycodone Rash    Rash all over   . Sulfamethoxazole Nausea And Vomiting  . Vitamin E      Review of Systems:  No headache, visual changes, nausea, vomiting, diarrhea, constipation, dizziness,  abdominal pain, skin rash, fevers, chills, night sweats, weight loss, swollen lymph nodes, body aches, joint swelling, chest pain, shortness of breath, mood changes. POSITIVE muscle aches  Objective  Blood pressure 100/72, pulse (!) 101, height 5\' 3"  (1.6 m), weight 122 lb (55.3 kg), SpO2 97 %.   General: No apparent distress alert and oriented x3 mood and affect normal, dressed appropriately.  HEENT: Pupils equal, extraocular movements intact  Respiratory: Patient's speak in full sentences and does not appear short of breath  Cardiovascular: No lower extremity edema, non tender, no erythema  Neuro: Cranial nerves II through XII are intact, neurovascularly intact in all extremities with 2+ DTRs and 2+ pulses.  Gait normal with good balance and coordination.  MSK:  Non tender with full range of motion and good stability and symmetric strength and tone of shoulders, elbows, wrist, hip, knee and ankles bilaterally.  Back -low back exam shows the patient does have some tenderness to palpation in the paraspinal musculature of the lumbar spine right greater than left.  Mild over the right sacroiliac joint.  Mild worsening with extension of the back at this level as well.  Osteopathic findings  T4 extended rotated and side bent left L4 flexed rotated and side bent left Sacrum right on right       Assessment and Plan:  Low back pain Still mild discomfort overall.  Today's patient's birthday.  We discussed the other medication but seems to be  doing much better overall.  Responds well to manipulation, follow-up again in 6 weeks   Nonallopathic problems  Decision today to treat with OMT was based on Physical Exam  After verbal consent patient was treated with HVLA, ME, FPR techniques in  thoracic, lumbar, and sacral  areas  Patient tolerated the procedure well with improvement in symptoms  Patient given exercises, stretches and lifestyle modifications  See medications in patient  instructions if given  Patient will follow up in 4-8 weeks      The above documentation has been reviewed and is accurate and complete [name], DO       Note: This dictation was prepared with Dragon dictation along with smaller phrase technology. Any transcriptional errors that result from this process are unintentional.

## 2020-07-12 ENCOUNTER — Encounter: Payer: Self-pay | Admitting: Gastroenterology

## 2020-07-23 NOTE — Telephone Encounter (Signed)
Faxed referral today to Renelda Mom

## 2020-08-08 ENCOUNTER — Other Ambulatory Visit: Payer: Self-pay

## 2020-08-08 ENCOUNTER — Ambulatory Visit (INDEPENDENT_AMBULATORY_CARE_PROVIDER_SITE_OTHER): Payer: BC Managed Care – PPO | Admitting: Psychiatry

## 2020-08-08 ENCOUNTER — Encounter: Payer: Self-pay | Admitting: Psychiatry

## 2020-08-08 VITALS — BP 101/59 | HR 68 | Ht 64.0 in | Wt 120.0 lb

## 2020-08-08 DIAGNOSIS — F411 Generalized anxiety disorder: Secondary | ICD-10-CM | POA: Diagnosis not present

## 2020-08-08 DIAGNOSIS — F332 Major depressive disorder, recurrent severe without psychotic features: Secondary | ICD-10-CM | POA: Diagnosis not present

## 2020-08-08 MED ORDER — LAMOTRIGINE 25 MG PO TABS: ORAL_TABLET | ORAL | 0 refills | Status: DC
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
## 2020-08-11 ENCOUNTER — Ambulatory Visit (HOSPITAL_COMMUNITY)
Admission: EM | Admit: 2020-08-11 | Discharge: 2020-08-11 | Disposition: A | Discharge status: Home / Self Care | Payer: BC Managed Care – PPO | Attending: Internal Medicine | Admitting: Internal Medicine

## 2020-08-11 ENCOUNTER — Other Ambulatory Visit: Payer: Self-pay

## 2020-08-11 DIAGNOSIS — R197 Diarrhea, unspecified: Secondary | ICD-10-CM | POA: Diagnosis not present

## 2020-08-11 DIAGNOSIS — R112 Nausea with vomiting, unspecified: Secondary | ICD-10-CM | POA: Diagnosis not present

## 2020-08-11 DIAGNOSIS — Z20822 Contact with and (suspected) exposure to covid-19: Secondary | ICD-10-CM | POA: Diagnosis not present

## 2020-08-11 LAB — SARS CORONAVIRUS 2 (TAT 6-24 HRS): SARS Coronavirus 2: NEGATIVE

## 2020-08-11 MED ORDER — ONDANSETRON 4 MG PO TBDP
4.0000 mg | ORAL_TABLET | Freq: Once | ORAL | Status: AC
  Administered 2020-08-11: 4 mg via ORAL

## 2020-08-11 MED ORDER — ONDANSETRON 4 MG PO TBDP
ORAL_TABLET | ORAL | Status: AC
  Filled 2020-08-11: qty 1

## 2020-08-11 MED ORDER — ONDANSETRON HCL 4 MG PO TABS: 4.0000 mg | ORAL_TABLET | Freq: Four times a day (QID) | ORAL | 0 refills | Status: AC | PRN

## 2020-08-11 NOTE — ED Triage Notes (Signed)
Pt brought to Triage because Pt reported she was going to faint.  Pt vitals SPO2 100%, HR 69, BP 108/71, Temp 97.5 . Pt A/O and speaking in full sentences. Pt having dry heaves during triage . Pt reports she first had N/V/D 2 days ago . Pt last alcohol intake was Friday night and has felt bad since then. No known sick contacts over past week per PT.

## 2020-08-11 NOTE — Discharge Instructions (Addendum)
Take the antinausea medication as directed.    Keep yourself hydrated with clear liquids, such as water, Gatorade, Pedialyte, Sprite, or ginger ale.    Go to the emergency department if you have acute worsening symptoms.    Follow up with your primary care provider if your symptoms are not improving.      

## 2020-08-11 NOTE — ED Provider Notes (Signed)
[facility]    CSN: [id number] Arrival date & time: 08/11/20  1033      History   Chief Complaint Chief Complaint  Patient presents with  . Nausea  . Emesis    HPI ARLENNE KIMBLEY is a 49 y.o. female.   Patient presents with nausea, vomiting, diarrhea, lightheadedness x2 days.  She denies fever, chills, sore throat, cough, shortness of breath, or other symptoms.  No treatment attempted at home.  Her medical history includes depression, anxiety, low back pain.  The history is provided by the patient and medical records.    Past Medical History:  Diagnosis Date  . Anxiety   . Breast mass 04/2012   left breast  . Complication of anesthesia    states was hard to wake up post-op  . Depression   . Dry cough    since vocal cord surgery  . Sleep related teeth grinding    wears mouth guard at night    Patient Active Problem List   Diagnosis Date Noted  . Right carpal tunnel syndrome 10/11/2018  . Generalized anxiety disorder 07/12/2018  . Morton's neuroma of left foot 01/16/2016  . Low back pain 12/26/2015  . Nonallopathic lesion of lumbosacral region 12/26/2015  . Nonallopathic lesion of sacral region 12/26/2015  . Nonallopathic lesion of thoracic region 12/26/2015    Past Surgical History:  Procedure Laterality Date  . AUGMENTATION MAMMAPLASTY Bilateral 2014  . BREAST MASS EXCISION Left 05/26/2012  . CERVIX SURGERY  2005   papilloma  . UMBILICAL HERNIA REPAIR  as a child   UHR  . vocal cord polyp excision  1999    OB History   No obstetric history on file.      Home Medications    Prior to Admission medications   Medication Sig Start Date End Date Taking? Authorizing Provider  desvenlafaxine (PRISTIQ) 100 MG 24 hr tablet 100 mg daily.    Yes [provider]  hyoscyamine (LEVBID) 0.375 MG 12 hr tablet Take 1 tablet by mouth every morning. 03/22/19  Yes [provider]  Vitamin D, Ergocalciferol, (DRISDOL) 1.25 MG (50000 UNIT)  CAPS capsule Take one capsule by mouth every 7 days. 01/15/20  Yes [name], DO  buPROPion (WELLBUTRIN XL) 150 MG 24 hr tablet Take 150 mg by mouth daily.    [provider]  hydrOXYzine (ATARAX/VISTARIL) 10 MG tablet TAKE 1 TABLET BY MOUTH THREE TIMES A DAY AS NEEDED 11/16/19   [name], DO  lamoTRIgine (LAMICTAL) 25 MG tablet Take 1 tablet (25 mg total) by mouth daily for 14 days, THEN 2 tablets (50 mg total) daily for 14 days. 08/08/20 09/05/20  Thayer Headings, PMHNP  methylphenidate 18 MG PO CR tablet Take 18 mg by mouth every morning. 08/04/20   [provider]  ondansetron (ZOFRAN) 4 MG tablet Take 1 tablet (4 mg total) by mouth every 6 (six) hours as needed for nausea or vomiting. 08/11/20   Sharion Balloon, NP  UNABLE TO FIND CBD Gummies at bedtime    [provider]    Family History Family History  Problem Relation Age of Onset  . Breast cancer Mother 63  . Depression Father   . Brain cancer Maternal Uncle   . Leukemia Maternal Grandmother   . Diabetes Maternal Grandfather   . Depression Paternal Grandfather   . ADD / ADHD Son   . Depression Nephew     Social History Social History   Tobacco  Use  . Smoking status: Current Every Day Smoker    Years: 25.00    Types: Cigarettes  . Smokeless tobacco: Never Used  . Tobacco comment: smokes 3 cig./day  Substance Use Topics  . Alcohol use: Yes    Alcohol/week: 0.0 standard drinks    Comment: occasional wine  . Drug use: No     Allergies   Chloraprep one step [chlorhexidine gluconate], Oxycodone, Sulfamethoxazole, and Vitamin e   Review of Systems Review of Systems  Constitutional: Negative for chills and fever.  HENT: Negative for ear pain and sore throat.   Eyes: Negative for pain and visual disturbance.  Respiratory: Negative for cough and shortness of breath.   Cardiovascular: Negative for chest pain and palpitations.  Gastrointestinal: Positive for diarrhea, nausea and  vomiting. Negative for abdominal pain.  Genitourinary: Negative for dysuria and hematuria.  Musculoskeletal: Negative for arthralgias and back pain.  Skin: Negative for color change and rash.  Neurological: Positive for light-headedness. Negative for seizures and syncope.  All other systems reviewed and are negative.    Physical Exam Triage Vital Signs ED Triage Vitals  Enc Vitals Group     BP 08/11/20 1053 112/77     Pulse Rate 08/11/20 1053 71     Resp 08/11/20 1053 20     Temp 08/11/20 1053 (!) 97.5 F (36.4 C)     Temp Source 08/11/20 1053 Tympanic     SpO2 08/11/20 1053 100 %     Weight 08/11/20 1056 122 lb (55.3 kg)     Height 08/11/20 1056 5\' 4"  (1.626 m)     Head Circumference --      Peak Flow --      Pain Score 08/11/20 1055 0     Pain Loc --      Pain Edu? --      Excl. in [place]? --    No data found.  Updated Vital Signs BP 112/77 (BP Location: Right Arm) Comment: BP recheck  Pulse 71   Temp (!) 97.5 F (36.4 C) (Tympanic)   Resp 20   Ht 5\' 4"  (1.626 m)   Wt 122 lb (55.3 kg)   LMP 02/27/2020 Comment: irregular  SpO2 100%   BMI 20.94 kg/m   Visual Acuity Right Eye Distance:   Left Eye Distance:   Bilateral Distance:    Right Eye Near:   Left Eye Near:    Bilateral Near:     Physical Exam Vitals and nursing note reviewed.  Constitutional:      General: She is not in acute distress.    Appearance: She is well-developed. She is ill-appearing.  HENT:     Head: Normocephalic and atraumatic.     Mouth/Throat:     Mouth: Mucous membranes are dry.     Pharynx: Oropharynx is clear.  Eyes:     Conjunctiva/sclera: Conjunctivae normal.  Cardiovascular:     Rate and Rhythm: Normal rate and regular rhythm.     Heart sounds: Normal heart sounds.  Pulmonary:     Effort: Pulmonary effort is normal. No respiratory distress.     Breath sounds: Normal breath sounds.  Abdominal:     General: Bowel sounds are normal.     Palpations: Abdomen is soft.      Tenderness: There is no abdominal tenderness. There is no right CVA tenderness, left CVA tenderness, guarding or rebound.  Musculoskeletal:     Cervical back: Neck supple.  Skin:    General: Skin is warm  and dry.     Findings: No rash.  Neurological:     General: No focal deficit present.     Mental Status: She is alert and oriented to person, place, and time.     Sensory: No sensory deficit.     Motor: No weakness.     Gait: Gait normal.  Psychiatric:        Mood and Affect: Mood normal.        Behavior: Behavior normal.      UC Treatments / Results  Labs (all labs ordered are listed, but only abnormal results are displayed) Labs Reviewed  SARS CORONAVIRUS 2 (TAT 6-24 HRS)    EKG   Radiology No results found.  Procedures Procedures (including critical care time)  Medications Ordered in UC Medications  ondansetron (ZOFRAN-ODT) disintegrating tablet 4 mg (4 mg Oral Given 08/11/20 1120)    Initial Impression / Assessment and Plan / UC Course  I have reviewed the triage vital signs and the nursing notes.  Pertinent labs & imaging results that were available during my care of the patient were reviewed by me and considered in my medical decision making (see chart for details).   Nausea, vomiting, diarrhea.  Patient able to tolerate oral fluids after Zofran given here.  Treating with Zofran at home.  Clear liquids discussed.  Instructed patient to go to the ED if she has acute worsening symptoms.  Otherwise follow-up with her PCP as needed.  PCR COVID pending.  Instructed patient to self quarantine until the test result is back.  Patient agrees to plan of care.   Final Clinical Impressions(s) / UC Diagnoses   Final diagnoses:  Encounter for laboratory testing for COVID-19 virus  Nausea vomiting and diarrhea     Discharge Instructions     Take the antinausea medication as directed.    Keep yourself hydrated with clear liquids, such as water, Gatorade, Pedialyte,  Sprite, or ginger ale.    Go to the emergency department if you have acute worsening symptoms.    Follow up with your primary care provider if your symptoms are not improving.         ED Prescriptions    Medication Sig Dispense Auth. Provider   ondansetron (ZOFRAN) 4 MG tablet Take 1 tablet (4 mg total) by mouth every 6 (six) hours as needed for nausea or vomiting. 12 tablet Sharion Balloon, NP     PDMP not reviewed this encounter.   Sharion Balloon, NP 08/11/20 1215

## 2020-08-15 ENCOUNTER — Encounter: Payer: BC Managed Care – PPO | Admitting: Gastroenterology

## 2020-08-27 ENCOUNTER — Ambulatory Visit: Payer: BC Managed Care – PPO | Admitting: Family Medicine

## 2020-08-27 ENCOUNTER — Encounter: Payer: Self-pay | Admitting: Family Medicine

## 2020-08-27 ENCOUNTER — Ambulatory Visit: Payer: Self-pay

## 2020-08-27 ENCOUNTER — Other Ambulatory Visit: Payer: Self-pay

## 2020-08-27 VITALS — BP 102/74 | HR 66 | Ht 64.0 in | Wt 126.0 lb

## 2020-08-27 DIAGNOSIS — M545 Low back pain, unspecified: Secondary | ICD-10-CM

## 2020-08-27 DIAGNOSIS — M999 Biomechanical lesion, unspecified: Secondary | ICD-10-CM | POA: Diagnosis not present

## 2020-08-27 DIAGNOSIS — G5601 Carpal tunnel syndrome, right upper limb: Secondary | ICD-10-CM

## 2020-08-27 DIAGNOSIS — G8929 Other chronic pain: Secondary | ICD-10-CM

## 2020-08-27 DIAGNOSIS — M25531 Pain in right wrist: Secondary | ICD-10-CM

## 2020-08-27 NOTE — Progress Notes (Signed)
Amanda [name] [place] Phone: [phone] Subjective:   [name], am serving as a scribe for Dr. Hulan Saas. This visit occurred during the SARS-CoV-2 public health emergency.  Safety protocols were in place, including screening questions prior to the visit, additional usage of staff PPE, and extensive cleaning of exam room while observing appropriate contact time as indicated for disinfecting solutions.   I'm seeing this patient by the request  of:  Amanda Stains, MD  CC: Back pain follow-up, wrist pain follow-up  MHD:[id number]  Amanda [name] is a 49 y.o. female coming in with complaint of back and neck pain. OMT 07/11/2020.   Patient states that her right carpal tunnel pain has been increasing.   Medications patient has been prescribed: Vit D, Atarax  Taking:         Reviewed prior external information including notes and imaging from previsou exam, outside providers and external EMR if available.   As well as notes that were available from care everywhere and other healthcare systems.  Past medical history, social, surgical and family history all reviewed in electronic medical record.  No pertanent information unless stated regarding to the chief complaint.   Past Medical History:  Diagnosis Date  . Anxiety   . Breast mass 04/2012   left breast  . Complication of anesthesia    states was hard to wake up post-op  . Depression   . Dry cough    since vocal cord surgery  . Sleep related teeth grinding    wears mouth guard at night    Allergies  Allergen Reactions  . Chloraprep One Step [Chlorhexidine Gluconate]   . Oxycodone Rash    Rash all over   . Sulfamethoxazole Nausea And Vomiting  . Vitamin E      Review of Systems:  No headache, visual changes, nausea, vomiting, diarrhea, constipation, dizziness, abdominal pain, skin rash, fevers, chills, night sweats, weight loss, swollen lymph  nodes, body aches, joint swelling, chest pain, shortness of breath, mood changes. POSITIVE muscle aches  Objective  Blood pressure 102/74, pulse 66, height 5\' 4"  (1.626 m), weight 126 lb (57.2 kg), SpO2 96 %.   General: No apparent distress alert and oriented x3 mood and affect normal, dressed appropriately.  HEENT: Pupils equal, extraocular movements intact  Respiratory: Patient's speak in full sentences and does not appear short of breath  Cardiovascular: No lower extremity edema, non tender, no erythema  Neuro: Cranial nerves II through XII are intact, neurovascularly intact in all extremities with 2+ DTRs and 2+ pulses.  Gait normal with good balance and coordination.  Low back exam does have some tenderness to palpation mostly in the thoracolumbar juncture and minorly lumbosacral area.  Negative straight leg test.  Tightness with Corky Sox right greater than left.  Mild pain over the sacroiliac joint.  5 out of 5 strength in lower extremities  Right wrist exam shows the patient does have a positive Tinel sign.  Patient does have some mild wasting of the thenar eminence compared to the contralateral side which is new.  Procedure: Real-time Ultrasound Guided Injection of right carpal tunnel Device: GE Logiq Q7  Ultrasound guided injection is preferred based studies that show increased duration, increased effect, greater accuracy, decreased procedural pain, increased response rate with ultrasound guided versus blind injection.  Verbal informed consent obtained.  Time-out conducted.  Noted no overlying erythema, induration, or other signs of local infection.  Skin prepped in  a sterile fashion.  Local anesthesia: Topical Ethyl chloride.  With sterile technique and under real time ultrasound guidance:  median nerve visualized.  23g 5/8 inch needle inserted distal to proximal approach into nerve sheath. Pictures taken nfor needle placement. Patient did have injection of 2 cc of 1% lidocaine, 1 cc of  0.5% Marcaine, and 1 cc of Kenalog 40 mg/dL. Completed without difficulty  Pain immediately resolved suggesting accurate placement of the medication.  Advised to call if fevers/chills, erythema, induration, drainage, or persistent bleeding.  Impression: Technically successful ultrasound guided injection.  Osteopathic findings  C6 flexed rotated and side bent left T3 extended rotated and side bent left inhaled rib L1 flexed rotated and side bent right Sacrum right on right       Assessment and Plan:  Right carpal tunnel syndrome Repeat injection given today.  Tolerated the procedure well, discussed icing regimen and home exercise, which activities to do which wants to avoid.  Patient will increase activity slowly.  This is the fifth injection and 2 years.  At this point I do feel that a nerve conduction study and potential surgical intervention will be necessary.  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  Patient though unfortunately is having difficulty with her menopause as well as some underlying depression and would like to hold on that at this time.  We will discuss again at follow-up in 6 weeks  Low back pain Chronic problem with exacerbation.  Patient has tried to be active but is not quite as active as usual.  Was started to do kickboxing and is awaiting it she states.  I do believe the patient stress and anxiety does play a role as well.  Discussed posture and ergonomics, which activities to do which wants to avoid.  Increase activity slowly.  Follow-up again in 4 to 8 weeks.    Nonallopathic problems  Decision today to treat with OMT was based on Physical Exam  After verbal consent patient was treated with HVLA, ME, FPR techniques in cervical, rib, thoracic, lumbar, and sacral  areas  Patient tolerated the procedure well with improvement in symptoms  Patient given exercises, stretches and lifestyle modifications  See medications in patient instructions if  given  Patient will follow up in 4-8 weeks      The above documentation has been reviewed and is accurate and complete Amanda Pulley, DO       Note: This dictation was prepared with Dragon dictation along with smaller phrase technology. Any transcriptional errors that result from this process are unintentional.

## 2020-08-27 NOTE — Assessment & Plan Note (Signed)
Chronic problem with exacerbation.  Patient has tried to be active but is not quite as active as usual.  Was started to do kickboxing and is awaiting it she states.  I do believe the patient stress and anxiety does play a role as well.  Discussed posture and ergonomics, which activities to do which wants to avoid.  Increase activity slowly.  Follow-up again in 4 to 8 weeks.

## 2020-08-27 NOTE — Patient Instructions (Addendum)
Good to see you Keep doing what you are doing Carpal tunnel injections today [place] See me again in 5-6 weeks we will talk about nerve conduction study

## 2020-08-27 NOTE — Assessment & Plan Note (Signed)
Repeat injection given today.  Tolerated the procedure well, discussed icing regimen and home exercise, which activities to do which wants to avoid.  Patient will increase activity slowly.  This is the fifth injection and 2 years.  At this point I do feel that a nerve conduction study and potential surgical intervention will be necessary.  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  Patient though unfortunately is having difficulty with her menopause as well as some underlying depression and would like to hold on that at this time.  We will discuss again at follow-up in 6 weeks

## 2020-08-28 ENCOUNTER — Telehealth: Payer: Self-pay | Admitting: Psychiatry

## 2020-08-28 NOTE — Telephone Encounter (Signed)
Pt lm stating she is not feeling well. She recently started a new medication and think this may be the cause of how she is feeling. She is requesting a call to discuss her symptoms.

## 2020-08-29 NOTE — Telephone Encounter (Signed)
Left patient voicemail earlier today. Will try back again in the morning.

## 2020-08-29 NOTE — Telephone Encounter (Signed)
Amanda [name] called and said she missed a phone call from [place]. Could one of you please call her back at [phone]. Thanks

## 2020-08-30 NOTE — Telephone Encounter (Signed)
Rtc to patient and she reports after starting Lamictal she became shaky, scared for no reason, felt obsessive and absent at the same time, she was having suicidal thoughts, felt like she was out of her body. She had apt on 11/11, but wasn't able to start medication until a week later due to food poisoning. She reports the symptoms/side effects were gradually worsening so she stopped the medication 3 days ago. She reports feeling better since stopping medication. Advised her to stay off medication and we would follow up with her.

## 2020-08-31 ENCOUNTER — Other Ambulatory Visit: Payer: Self-pay | Admitting: Psychiatry

## 2020-08-31 DIAGNOSIS — F332 Major depressive disorder, recurrent severe without psychotic features: Secondary | ICD-10-CM

## 2020-09-03 NOTE — Telephone Encounter (Signed)
Follow up with patient and she is much better this week, all side effects have went away. She is aware not to take anymore Lamictal and she will follow up with Janett Billow on 12/29

## 2020-09-13 ENCOUNTER — Other Ambulatory Visit: Payer: Self-pay | Admitting: Obstetrics and Gynecology

## 2020-09-13 DIAGNOSIS — N6320 Unspecified lump in the left breast, unspecified quadrant: Secondary | ICD-10-CM

## 2020-09-15 ENCOUNTER — Other Ambulatory Visit: Payer: Self-pay | Admitting: Family Medicine

## 2020-09-16 NOTE — Telephone Encounter (Signed)
Patient is scheduled to see Dr Renelda Mom with [facility] on 10/22/20.

## 2020-09-19 ENCOUNTER — Other Ambulatory Visit: Payer: Self-pay | Admitting: Gastroenterology

## 2020-09-23 ENCOUNTER — Other Ambulatory Visit: Payer: Self-pay | Admitting: Gastroenterology

## 2020-09-25 ENCOUNTER — Ambulatory Visit (INDEPENDENT_AMBULATORY_CARE_PROVIDER_SITE_OTHER): Payer: BC Managed Care – PPO | Admitting: Psychiatry

## 2020-09-25 ENCOUNTER — Encounter: Payer: Self-pay | Admitting: Psychiatry

## 2020-09-25 ENCOUNTER — Other Ambulatory Visit: Payer: Self-pay

## 2020-09-25 DIAGNOSIS — F411 Generalized anxiety disorder: Secondary | ICD-10-CM | POA: Diagnosis not present

## 2020-09-25 DIAGNOSIS — F332 Major depressive disorder, recurrent severe without psychotic features: Secondary | ICD-10-CM | POA: Diagnosis not present

## 2020-09-25 DIAGNOSIS — F909 Attention-deficit hyperactivity disorder, unspecified type: Secondary | ICD-10-CM | POA: Diagnosis not present

## 2020-09-25 MED ORDER — AMPHETAMINE-DEXTROAMPHET ER 10 MG PO CP24: 10.0000 mg | ORAL_CAPSULE | Freq: Every day | ORAL | 0 refills | Status: AC

## 2020-09-25 NOTE — Progress Notes (Signed)
Amanda [name] [id number] 05/20/1971 49 y.o.  Subjective:   Patient ID:  Amanda [name] is a 49 y.o. (DOB 05/20/1971) female.  Chief Complaint:  Chief Complaint  Patient presents with  . Depression  . Anxiety  . ADHD    HPI Amanda [name] presents to the office today for follow-up of depression, anxiety, and ADHD. Lamotrigine was discontinued due to adverse reaction. She reports that she felt shaky, scared, and like someone was following her while taking Lamictal.  She reports that she re-started Fluoxetine in late November. She reports that she is currently taking Prozac 40 mg po qd and transition off Desvenlafaxine. Has been receiving Spravato monthly. Had Spravato last year and re-started Spravato less than 2 weeks ago.  She reports, "I have been feeling significantly better in the last week." She reports improved mood and anxiety. She reports feeling "less depressed." She reports that she had a severe depressive episode in early December and was contemplating going to the hospital.She reports that after a few days her mood improved. Rates depression a 6 out of 10, with 10= most depressed mood imaginable. She reports improved anxiety and reports that she continues to notice some anxiety- "I'm still a nervous person and perfectionistic." She reports that anxiety has been more manageable. She reports that she is sleeping well. Sleeping 9-10 hours. She reports that her energy and motivation have improved. Appetite has improved. She reports that it is difficult to assess concentration since she is on break and not working. Has been able to stay on task with chores and bills. Increased interest and enjoyment in things. Denies any SI since early December.    Has been doing kickboxing and this helps with anxiety.   She reports that she is now taking stimulant daily and was previously taking it as needed. Reports that she is now taking Dextroamphetamine while on break to help with energy.  [facility]  [facility]   Past Psychiatric Medication Trials: Prozac- Took for many years with good response and feels that it was no longer effective. Has not taken for the last 2 years. Most effective. Had sexual side effects.  Lexapro- Took when she was young Pristiq Cymbalta Effexor XR Trintellix Viibryd Wellbutrin XL- Prescribed for smoking cessation.  Spravato Concerta- Started recently,. Feels more balance Vyvanse- insomnia Adderall Xr 5 mg BID- feels that it is very activating and feels that it wears off abruptly.  Hydroxyzine- Takes for insomnia Xanax Lamictal- Adverse reaction. Increased anxiety. Felt as if she is being followed.  Review of Systems:  Review of Systems  HENT: Positive for congestion.   Musculoskeletal: Negative for gait problem.  Neurological: Negative for tremors and headaches.  Psychiatric/Behavioral:       Please refer to HPI    Medications: I have reviewed the patient's current medications.  Current Outpatient Medications  Medication Sig Dispense Refill  . amphetamine-dextroamphetamine (ADDERALL XR) 10 MG 24 hr capsule Take 1 capsule (10 mg total) by mouth daily. 30 capsule 0  . buPROPion (WELLBUTRIN XL) 150 MG 24 hr tablet Take 150 mg by mouth daily.    Marland Kitchen. desvenlafaxine (PRISTIQ) 100 MG 24 hr tablet 50 mg daily.    Marland Kitchen. FLUoxetine (PROZAC) 40 MG capsule Take 40 mg by mouth at bedtime.    . hydrOXYzine (ATARAX/VISTARIL) 10 MG tablet TAKE 1 TABLET BY MOUTH THREE TIMES A DAY AS NEEDED 270 tablet 0  . hyoscyamine (LEVBID) 0.375 MG 12 hr tablet TAKE 1 TABLET  BY MOUTH TWICE A DAY 180 tablet 0  . UNABLE TO FIND CBD Gummies at bedtime    . methylphenidate 18 MG PO CR tablet Take 18 mg by mouth every morning. (Patient not taking: Reported on 09/25/2020)    . ondansetron (ZOFRAN) 4 MG tablet Take 1 tablet (4 mg total) by mouth every 6 (six) hours as needed for nausea or vomiting. 12 tablet 0  . Vitamin D, Ergocalciferol, (DRISDOL) 1.25 MG (50000  UNIT) CAPS capsule TAKE 1 CAPSULE (50,000 UNITS TOTAL) BY MOUTH EVERY 7 (SEVEN) DAYS. 12 capsule 0   No current facility-administered medications for this visit.    Medication Side Effects: None  Allergies:  Allergies  Allergen Reactions  . Chloraprep One Step [Chlorhexidine Gluconate]   . Oxycodone Rash    Rash all over   . Sulfamethoxazole Nausea And Vomiting  . Vitamin E     Past Medical History:  Diagnosis Date  . Anxiety   . Breast mass 04/2012   left breast  . Complication of anesthesia    states was hard to wake up post-op  . Depression   . Dry cough    since vocal cord surgery  . Sleep related teeth grinding    wears mouth guard at night    Family History  Problem Relation Age of Onset  . Breast cancer Mother 39  . Depression Father   . Brain cancer Maternal Uncle   . Leukemia Maternal Grandmother   . Diabetes Maternal Grandfather   . Depression Paternal Grandfather   . ADD / ADHD Son   . Depression Nephew     Social History   Socioeconomic History  . Marital status: Married    Spouse name: Not on file  . Number of children: 1  . Years of education: Not on file  . Highest education level: Not on file  Occupational History  . Occupation: Spanish Professor  Tobacco Use  . Smoking status: Current Every Day Smoker    Years: 25.00    Types: Cigarettes  . Smokeless tobacco: Never Used  . Tobacco comment: smokes 3 cig./day  Substance and Sexual Activity  . Alcohol use: Yes    Alcohol/week: 0.0 standard drinks    Comment: occasional wine  . Drug use: No  . Sexual activity: Not on file  Other Topics Concern  . Not on file  Social History Narrative  . Not on file   Social Determinants of Health   Financial Resource Strain: Not on file  Food Insecurity: Not on file  Transportation Needs: Not on file  Physical Activity: Not on file  Stress: Not on file  Social Connections: Not on file  Intimate Partner Violence: Not on file    Past Medical  History, Surgical history, Social history, and Family history were reviewed and updated as appropriate.   Please see review of systems for further details on the patient's review from today.   Objective:   Physical Exam:  There were no vitals taken for this visit.  Physical Exam Constitutional:      General: She is not in acute distress. Musculoskeletal:        General: No deformity.  Neurological:     Mental Status: She is alert and oriented to person, place, and time.     Coordination: Coordination normal.  Psychiatric:        Attention and Perception: Attention and perception normal. She does not perceive auditory or visual hallucinations.  Mood and Affect: Mood is anxious and depressed. Affect is not labile, blunt, angry or inappropriate.        Speech: Speech normal.        Behavior: Behavior normal.        Thought Content: Thought content normal. Thought content is not paranoid or delusional. Thought content does not include homicidal or suicidal ideation. Thought content does not include homicidal or suicidal plan.        Cognition and Memory: Cognition and memory normal.        Judgment: Judgment normal.     Comments: Insight intact Mood presents as less depressed and less anxious     Lab Review:     Component Value Date/Time   NA 138 09/06/2018 0927   K 3.9 09/06/2018 0927   CL 106 09/06/2018 0927   GLUCOSE 87 09/06/2018 0927   BUN 12 09/06/2018 0927   CREATININE 0.60 09/06/2018 0927       Component Value Date/Time   WBC 6.2 09/06/2018 1011   RBC 3.93 09/06/2018 1011   HGB 11.7 (L) 09/06/2018 1011   HCT 35.8 (L) 09/06/2018 1011   PLT 228 09/06/2018 1011   MCV 91.1 09/06/2018 1011   MCH 29.8 09/06/2018 1011   MCHC 32.7 09/06/2018 1011   RDW 12.3 09/06/2018 1011    No results found for: POCLITH, LITHIUM   No results found for: PHENYTOIN, PHENOBARB, VALPROATE, CBMZ   .res Assessment: Plan:   Pt seen for 30 minutes and time spent counseling  patient regarding treatment plan and discussed deciding if she plans to continue treatment with previous provider or this provider. Discussed continuing current treatment plan she reports that her mood and anxiety s/s have significantly improved in the last week. Discussed continuing Spravato treatment and that she may wish to continue treatment with previous provider since she is currently receiving Spravato treatments in that clinic. Discussed that there may be an option for her to continue Spravato in previous provider's clinic and see this provider for medication management. Discussed her response to stimulants since pt reports that improved mood also correlates with her taking Adderall XR instead of Concerta. Script sent for Adderall XR 10 mg po q am for ADHD.  Discussed mood charting to track response to medications and provided patient with mood chart.  Pt to follow-up as needed, based upon her decision regarding where she would like to continue treatment.  Recommend continuing therapy with Bradley Ferris, [facility].  Patient advised to contact office with any questions, adverse effects, or acute worsening in signs and symptoms.    Zarinah was seen today for depression, anxiety and adhd.  Diagnoses and all orders for this visit:  Attention deficit hyperactivity disorder (ADHD), unspecified ADHD type -     amphetamine-dextroamphetamine (ADDERALL XR) 10 MG 24 hr capsule; Take 1 capsule (10 mg total) by mouth daily.  Severe episode of recurrent major depressive disorder, without psychotic features (HCC)  Generalized anxiety disorder     Please see After Visit Summary for patient specific instructions.  Future Appointments  Date Time Provider Department Center  10/08/2020  2:15 PM Judi Saa, DO [facility] None  10/29/2020  3:00 PM [facility] [place] 1 [facility]  10/29/2020  3:10 PM [facility] [place] 1 [facility]  11/14/2020 10:30 AM Corie Chiquito, PMHNP [facility] None    No orders of  the defined types were placed in this encounter.   -------------------------------

## 2020-10-01 ENCOUNTER — Telehealth: Payer: Self-pay

## 2020-10-01 NOTE — Telephone Encounter (Signed)
Prior Authorization submitted and approved for AMPHETAMINE-DEXTROAMPHETAMINE ER 10 MG with [employer] effective 10/01/2020-10/02/2023

## 2020-10-08 ENCOUNTER — Other Ambulatory Visit: Payer: Self-pay

## 2020-10-08 ENCOUNTER — Ambulatory Visit: Payer: BC Managed Care – PPO | Admitting: Family Medicine

## 2020-10-08 ENCOUNTER — Encounter: Payer: Self-pay | Admitting: Family Medicine

## 2020-10-08 VITALS — BP 102/82 | HR 82 | Ht 64.0 in | Wt 126.0 lb

## 2020-10-08 DIAGNOSIS — M999 Biomechanical lesion, unspecified: Secondary | ICD-10-CM

## 2020-10-08 DIAGNOSIS — M545 Low back pain, unspecified: Secondary | ICD-10-CM

## 2020-10-08 DIAGNOSIS — G8929 Other chronic pain: Secondary | ICD-10-CM | POA: Diagnosis not present

## 2020-10-08 DIAGNOSIS — G5601 Carpal tunnel syndrome, right upper limb: Secondary | ICD-10-CM

## 2020-10-08 NOTE — Assessment & Plan Note (Signed)
Low back pain seems to be multifactorial but has responded very well to osteopathic manipulation.  X-rays in 2020 only showed mild degenerative changes.  Patient does have tightness of the hip flexors and we discussed continuing the stretching aspect of it.  Follow-up with me again in 5 to 6 weeks

## 2020-10-08 NOTE — Progress Notes (Signed)
[place] Phone: [phone] Subjective:   Amanda [name], am serving as a scribe for Dr. Hulan Saas. This visit occurred during the SARS-CoV-2 public health emergency.  Safety protocols were in place, including screening questions prior to the visit, additional usage of staff PPE, and extensive cleaning of exam room while observing appropriate contact time as indicated for disinfecting solutions.  I'm seeing this patient by the request  of:  [name], MD  CC: Low back pain follow-up, wrist pain follow-up  [id number]   08/27/2020 Repeat injection given today.  Tolerated the procedure well, discussed icing regimen and home exercise, which activities to do which wants to avoid.  Patient will increase activity slowly.  This is the fifth injection and 2 years.  At this point I do feel that a nerve conduction study and potential surgical intervention will be necessary.  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  Patient though unfortunately is having difficulty with her menopause as well as some underlying depression and would like to hold on that at this time.  We will discuss again at follow-up in 6 weeks   Update 10/08/2020 Amanda [name] is a 50 y.o. female coming in with complaint of back and neck pain. Right carpal tunnel f/u. OMT 08/27/2020. Patient states that injection last visit helped right hand pain. Pain exacerbated by mousing.  States that it is improved but still has some numbness in the hand.  Low back still tightness but is able to do boxing on a regular basis.  [name] radiation down the leg [name] numbness or tingling  Medications patient has been prescribed: Any other medications that we have prescribed patient is not taking with changes to her antidepressant and generalized anxiety disorder medications          Reviewed prior external information including notes and imaging from  previsou exam, outside providers and external EMR if available.   As well as notes that were available from care everywhere and other healthcare systems.  Past medical history, social, surgical and family history all reviewed in electronic medical record.  [name] pertanent information unless stated regarding to the chief complaint.   Past Medical History:  Diagnosis Date  . Anxiety   . Breast mass 04/2012   left breast  . Complication of anesthesia    states was hard to wake up post-op  . Depression   . Dry cough    since vocal cord surgery  . Sleep related teeth grinding    wears mouth guard at night    Allergies  Allergen Reactions  . Chloraprep One Step [Chlorhexidine Gluconate]   . Oxycodone Rash    Rash all over   . Sulfamethoxazole Nausea And Vomiting  . Vitamin E      Review of Systems:  [name] headache, visual changes, nausea, vomiting, diarrhea, constipation, dizziness, abdominal pain, skin rash, fevers, chills, night sweats, weight loss, swollen lymph nodes, body aches, joint swelling, chest pain, shortness of breath, mood changes. POSITIVE muscle aches  Objective  Blood pressure 102/82, pulse 82, height 5\' 4"  (1.626 m), weight 126 lb (57.2 kg), SpO2 98 %.   General: [name] apparent distress alert and oriented x3 mood and affect normal, dressed appropriately.  HEENT: Pupils equal, extraocular movements intact  Respiratory: Patient's speak in full sentences and does not appear short of breath  Cardiovascular: [name] lower extremity edema, non tender, [name] erythema  Neuro: Cranial nerves II  through XII are intact, neurovascularly intact in all extremities with 2+ DTRs and 2+ pulses.  Gait normal with good balance and coordination.  MSK:  Non tender with full range of motion and good stability and symmetric strength and tone of shoulders, elbows, wrist, hip, knee and ankles bilaterally.  Back -low back exam does have some mild loss of lordosis.  Some tightness noted in the paraspinal  musculature in the thoracolumbar and lumbosacral areas.  Right sacroiliac joint seems to be the most tender in the left thoracolumbar junction.  Full range of motion is noted and 5 out of 5 strength to lower extremities  Osteopathic findings   T7 extended rotated and side bent left L1 flexed rotated and side bent left Sacrum right on right       Assessment and Plan:  Right carpal tunnel syndrome Patient has responded very well to the injections but has had now for injections in the last 2 years.  Would like a nerve conduction study to further evaluate.  Continues to respond well to the injections and conservative therapy but if moderate to severe and with patient's work history may need to consider surgical intervention.  Low back pain Low back pain seems to be multifactorial but has responded very well to osteopathic manipulation.  X-rays in 2020 only showed mild degenerative changes.  Patient does have tightness of the hip flexors and we discussed continuing the stretching aspect of it.  Follow-up with me again in 5 to 6 weeks    Nonallopathic problems  Decision today to treat with OMT was based on Physical Exam  After verbal consent patient was treated with HVLA, ME, FPR techniques in cervical,  thoracic, lumbar, and sacral  areas  Patient tolerated the procedure well with improvement in symptoms  Patient given exercises, stretches and lifestyle modifications  See medications in patient instructions if given  Patient will follow up in 4-8 weeks      The above documentation has been reviewed and is accurate and complete [name], DO       Note: This dictation was prepared with Dragon dictation along with smaller phrase technology. Any transcriptional errors that result from this process are unintentional.

## 2020-10-08 NOTE — Assessment & Plan Note (Signed)
Patient has responded very well to the injections but has had now for injections in the last 2 years.  Would like a nerve conduction study to further evaluate.  Continues to respond well to the injections and conservative therapy but if moderate to severe and with patient's work history may need to consider surgical intervention.

## 2020-10-08 NOTE — Patient Instructions (Signed)
RUE nerve conduction study See me again in 5-6 weeks

## 2020-10-16 ENCOUNTER — Other Ambulatory Visit: Payer: Self-pay

## 2020-10-16 ENCOUNTER — Encounter: Payer: Self-pay | Admitting: Neurology

## 2020-10-16 DIAGNOSIS — R202 Paresthesia of skin: Secondary | ICD-10-CM

## 2020-10-29 ENCOUNTER — Ambulatory Visit
Admission: RE | Admit: 2020-10-29 | Discharge: 2020-10-29 | Disposition: A | Discharge status: Home / Self Care | Payer: BC Managed Care – PPO | Source: Ambulatory Visit | Attending: Obstetrics and Gynecology | Admitting: Obstetrics and Gynecology

## 2020-10-29 ENCOUNTER — Other Ambulatory Visit: Payer: Self-pay

## 2020-10-29 ENCOUNTER — Other Ambulatory Visit: Payer: Self-pay | Admitting: Obstetrics and Gynecology

## 2020-10-29 DIAGNOSIS — N6320 Unspecified lump in the left breast, unspecified quadrant: Secondary | ICD-10-CM

## 2020-11-14 ENCOUNTER — Ambulatory Visit: Payer: BC Managed Care – PPO | Admitting: Psychiatry

## 2020-11-19 NOTE — Progress Notes (Signed)
[place] [address] [place] Phone: [phone] Subjective:   I [name] am serving as a Education administrator for Dr. Hulan Saas.  This visit occurred during the SARS-CoV-2 public health emergency.  Safety protocols were in place, including screening questions prior to the visit, additional usage of staff PPE, and extensive cleaning of exam room while observing appropriate contact time as indicated for disinfecting solutions.   I'm seeing this patient by the request  of:  [name], MD  CC: low back pain and wrist pain   [id number]  NYX KEADY is a 50 y.o. female coming in with complaint of back and neck pain. OMT 10/08/2020. Patient states her neck is painful. Believes she slept on it wrong.  Patient denies any radiation down the arms.  Patient states some mild increase in tightness recently though.  Patient continues to wait for her nerve conduction study for the right wrist.  Has noticed some very mild discomfort of the left wrist recently.  Continuing to have the recurrence of numbness in the hand but not as severe as it was previously on the right side.  Medications patient has been prescribed: Vit D           Reviewed prior external information including notes and imaging from previsou exam, outside providers and external EMR if available.   As well as notes that were available from care everywhere and other healthcare systems.  Past medical history, social, surgical and family history all reviewed in electronic medical record.  No pertanent information unless stated regarding to the chief complaint.   Past Medical History:  Diagnosis Date  . Anxiety   . Breast mass 04/2012   left breast  . Complication of anesthesia    states was hard to wake up post-op  . Depression   . Dry cough    since vocal cord surgery  . Sleep related teeth grinding    wears mouth guard at night    Allergies  Allergen Reactions  .  Chloraprep One Step [Chlorhexidine Gluconate]   . Oxycodone Rash    Rash all over   . Sulfamethoxazole Nausea And Vomiting  . Vitamin E      Review of Systems:  No headache, visual changes, nausea, vomiting, diarrhea, constipation, dizziness, abdominal pain, skin rash, fevers, chills, night sweats, weight loss, swollen lymph nodes, body aches, joint swelling, chest pain, shortness of breath, mood changes. POSITIVE muscle aches  Objective  Blood pressure 100/60, pulse 84, height 5\' 4"  (1.626 m), weight 124 lb (56.2 kg), SpO2 99 %.   General: No apparent distress alert and oriented x3 mood and affect normal, dressed appropriately.  HEENT: Pupils equal, extraocular movements intact  Respiratory: Patient's speak in full sentences and does not appear short of breath  Cardiovascular: No lower extremity edema, non tender, no erythema  Gait normal with good balance and coordination.  MSK:  Non tender with full range of motion and good stability and symmetric strength and tone of shoulders, elbows, wrist, hip, knee and ankles bilaterally.  Back -low back does have some mild loss of lordosis.  Tightness noted in the paraspinal musculature mostly in the thoracolumbar but also near the right sacroiliac joint.  Mild positive Corky Sox on the right.  Negative straight leg test.  Osteopathic findings   T7 extended rotated and side bent left L3 flexed rotated and side bent right Sacrum right on right       Assessment and Plan:  Low back pain Lower back pain mild neck pain.  Patient did have Covid and was laying around more.  Has some mild increase in tightness.  Not taking any true medications for it though.  Discussed posture and ergonomics, discussed increasing activity again.  Discussed some mild vitamin supplementations that could be helpful.  Follow-up again in 6 weeks    Nonallopathic problems  Decision today to treat with OMT was based on Physical Exam  After verbal consent patient was  treated with HVLA, ME, FPR techniques in  thoracic, lumbar, and sacral  areas  Patient tolerated the procedure well with improvement in symptoms  Patient given exercises, stretches and lifestyle modifications  See medications in patient instructions if given  Patient will follow up in 4-8 weeks      The above documentation has been reviewed and is accurate and complete [name], DO       Note: This dictation was prepared with Dragon dictation along with smaller phrase technology. Any transcriptional errors that result from this process are unintentional.

## 2020-11-21 ENCOUNTER — Encounter: Payer: Self-pay | Admitting: Family Medicine

## 2020-11-21 ENCOUNTER — Other Ambulatory Visit: Payer: Self-pay

## 2020-11-21 ENCOUNTER — Ambulatory Visit: Payer: BC Managed Care – PPO | Admitting: Family Medicine

## 2020-11-21 VITALS — BP 100/60 | HR 84 | Ht 64.0 in | Wt 124.0 lb

## 2020-11-21 DIAGNOSIS — M545 Low back pain, unspecified: Secondary | ICD-10-CM

## 2020-11-21 DIAGNOSIS — M999 Biomechanical lesion, unspecified: Secondary | ICD-10-CM

## 2020-11-21 DIAGNOSIS — G8929 Other chronic pain: Secondary | ICD-10-CM

## 2020-11-21 NOTE — Patient Instructions (Signed)
Good to see you COQ 10 100 mg daily for a months Lets see what nerve conduction says See me again in 6 weeks

## 2020-11-21 NOTE — Assessment & Plan Note (Signed)
Lower back pain mild neck pain.  Patient did have Covid and was laying around more.  Has some mild increase in tightness.  Not taking any true medications for it though.  Discussed posture and ergonomics, discussed increasing activity again.  Discussed some mild vitamin supplementations that could be helpful.  Follow-up again in 6 weeks

## 2020-12-03 ENCOUNTER — Other Ambulatory Visit: Payer: Self-pay

## 2020-12-03 ENCOUNTER — Ambulatory Visit: Payer: BC Managed Care – PPO | Admitting: Neurology

## 2020-12-03 DIAGNOSIS — R202 Paresthesia of skin: Secondary | ICD-10-CM

## 2020-12-03 DIAGNOSIS — G5601 Carpal tunnel syndrome, right upper limb: Secondary | ICD-10-CM

## 2020-12-03 NOTE — Procedures (Signed)
[facility]  [place], [place]  [place],  [address] Tel: [phone] Fax:  [phone] Test Date:  12/03/2020  Patient: Amanda [name] DOB: November 11, 1970 Physician: Narda Amber, DO  Sex: Female Height: 5\' 4"  Ref Phys: Hulan Saas, DO  ID#: [id number]   Technician:    Patient Complaints: This is a 50 year old female referred for evaluation of right hand numbness and tingling.  NCV & EMG Findings: Extensive electrodiagnostic testing of the right upper extremity shows:  1. Right mixed palmar sensory responses show prolonged latency.  Right median and ulnar sensory responses are within normal limits.   2. Right median and ulnar motor responses are within normal limits.   3. There is no evidence of active or chronic motor axonal loss changes affecting any of the tested muscles.  Motor unit configuration and recruitment pattern is within normal limits.    Impression: Right median neuropathy at or distal to the wrist, consistent with a clinical diagnosis of carpal tunnel syndrome.  Overall, these findings are very mild in degree electrically.   ___________________________ Narda Amber, DO    Nerve Conduction Studies Anti Sensory Summary Table   Stim Site NR Peak (ms) Norm Peak (ms) P-T Amp (V) Norm P-T Amp  Right Median Anti Sensory (2nd Digit)  33C  Wrist    3.1 <3.4 47.2 >20  Right Ulnar Anti Sensory (5th Digit)  33C  Wrist    2.3 <3.1 42.8 >12   Motor Summary Table   Stim Site NR Onset (ms) Norm Onset (ms) O-P Amp (mV) Norm O-P Amp Site1 Site2 Delta-0 (ms) Dist (cm) Vel (m/s) Norm Vel (m/s)  Right Median Motor (Abd Poll Brev)  33C  Wrist    3.4 <3.9 9.8 >6 Elbow Wrist 5.0 27.0 54 >50  Elbow    8.4  9.8         Right Ulnar Motor (Abd Dig Minimi)  33C  Wrist    1.8 <3.1 12.7 >7 B Elbow Wrist 3.1 20.0 65 >50  B Elbow    4.9  11.6  A Elbow B Elbow 1.7 10.0 59 >50  A Elbow    6.6  11.1          Comparison Summary Table   Stim Site NR Peak (ms)  Norm Peak (ms) P-T Amp (V) Site1 Site2 Delta-P (ms) Norm Delta (ms)  Right Median/Ulnar Palm Comparison (Wrist - 8cm)  33C  Median Palm    2.1 <2.2 57.0 Median Palm Ulnar Palm 0.6   Ulnar Palm    1.5 <2.2 18.7       EMG   Side Muscle Ins Act Fibs Psw Fasc Number Recrt Dur Dur. Amp Amp. Poly Poly. Comment  Right 1stDorInt Nml Nml Nml Nml Nml Nml Nml Nml Nml Nml Nml Nml N/A  Right Abd Poll Brev Nml Nml Nml Nml Nml Nml Nml Nml Nml Nml Nml Nml N/A  Right PronatorTeres Nml Nml Nml Nml Nml Nml Nml Nml Nml Nml Nml Nml N/A  Right Biceps Nml Nml Nml Nml Nml Nml Nml Nml Nml Nml Nml Nml N/A  Right Triceps Nml Nml Nml Nml Nml Nml Nml Nml Nml Nml Nml Nml N/A  Right Deltoid Nml Nml Nml Nml Nml Nml Nml Nml Nml Nml Nml Nml N/A      Waveforms:

## 2020-12-05 ENCOUNTER — Other Ambulatory Visit: Payer: Self-pay | Admitting: Family Medicine

## 2021-01-02 ENCOUNTER — Ambulatory Visit: Payer: BC Managed Care – PPO | Admitting: Family Medicine

## 2021-01-02 ENCOUNTER — Other Ambulatory Visit: Payer: Self-pay

## 2021-01-02 ENCOUNTER — Telehealth: Payer: Self-pay | Admitting: Family Medicine

## 2021-01-02 ENCOUNTER — Encounter: Payer: Self-pay | Admitting: Family Medicine

## 2021-01-02 VITALS — BP 104/60 | HR 76 | Ht 64.0 in | Wt 126.0 lb

## 2021-01-02 DIAGNOSIS — M999 Biomechanical lesion, unspecified: Secondary | ICD-10-CM | POA: Diagnosis not present

## 2021-01-02 DIAGNOSIS — M62838 Other muscle spasm: Secondary | ICD-10-CM | POA: Diagnosis not present

## 2021-01-02 MED ORDER — PREDNISONE 20 MG PO TABS: 20.0000 mg | ORAL_TABLET | Freq: Every day | ORAL | 0 refills | Status: DC

## 2021-01-02 MED ORDER — TIZANIDINE HCL 2 MG PO TABS: 2.0000 mg | ORAL_TABLET | Freq: Every day | ORAL | 0 refills | Status: DC

## 2021-01-02 NOTE — Patient Instructions (Addendum)
Good to see you Prednisone 20 mg for 5 days zanaflex 2 mg at night See me again as scheduled if pain worsens seek medical attention

## 2021-01-02 NOTE — Telephone Encounter (Signed)
Patient is coming in for an appointment this afternoon.

## 2021-01-02 NOTE — Progress Notes (Signed)
[place] [address] [place] Phone: [phone] Subjective:   I [name] am serving as a Education administrator for Dr. Hulan Saas.  This visit occurred during the SARS-CoV-2 public health emergency.  Safety protocols were in place, including screening questions prior to the visit, additional usage of staff PPE, and extensive cleaning of exam room while observing appropriate contact time as indicated for disinfecting solutions.   I'm seeing this patient by the request  of:  [name], MD  CC: low back pain   [id number]  Amanda [name] is a 50 y.o. female coming in with complaint of low back pain. Last seen 11/21/2020 for OMT. Neck is stiff today and very painful. States she can not move her neck. Back is doing well. Only neck and only the last day      Past Medical History:  Diagnosis Date  . Anxiety   . Breast mass 04/2012   left breast  . Complication of anesthesia    states was hard to wake up post-op  . Depression   . Dry cough    since vocal cord surgery  . Sleep related teeth grinding    wears mouth guard at night   Past Surgical History:  Procedure Laterality Date  . AUGMENTATION MAMMAPLASTY Bilateral 2014  . BREAST MASS EXCISION Left 05/26/2012  . CERVIX SURGERY  2005   papilloma  . UMBILICAL HERNIA REPAIR  as a child   UHR  . vocal cord polyp excision  1999   Social History   Socioeconomic History  . Marital status: Married    Spouse name: Not on file  . Number of children: 1  . Years of education: Not on file  . Highest education level: Not on file  Occupational History  . Occupation: Spanish Professor  Tobacco Use  . Smoking status: Current Every Day Smoker    Years: 25.00    Types: Cigarettes  . Smokeless tobacco: Never Used  . Tobacco comment: smokes 3 cig./day  Substance and Sexual Activity  . Alcohol use: Yes    Alcohol/week: 0.0 standard drinks    Comment: occasional wine  . Drug use: No   . Sexual activity: Not on file  Other Topics Concern  . Not on file  Social History Narrative  . Not on file   Social Determinants of Health   Financial Resource Strain: Not on file  Food Insecurity: Not on file  Transportation Needs: Not on file  Physical Activity: Not on file  Stress: Not on file  Social Connections: Not on file   Allergies  Allergen Reactions  . Chloraprep One Step [Chlorhexidine Gluconate]   . Oxycodone Rash    Rash all over   . Sulfamethoxazole Nausea And Vomiting  . Vitamin E    Family History  Problem Relation Age of Onset  . Breast cancer Mother 74  . Depression Father   . Brain cancer Maternal Uncle   . Leukemia Maternal Grandmother   . Diabetes Maternal Grandfather   . Depression Paternal Grandfather   . ADD / ADHD Son   . Depression Nephew     Current Outpatient Medications (Endocrine & Metabolic):  .  predniSONE (DELTASONE) 20 MG tablet, Take 1 tablet (20 mg total) by mouth daily with breakfast. .  predniSONE (DELTASONE) 20 MG tablet, Take 1 tablet (20 mg total) by mouth daily with breakfast.      Current Outpatient Medications (Other):  .  amphetamine-dextroamphetamine (ADDERALL XR)  10 MG 24 hr capsule, Take 1 capsule (10 mg total) by mouth daily. Marland Kitchen  buPROPion (WELLBUTRIN XL) 150 MG 24 hr tablet, Take 150 mg by mouth daily. Marland Kitchen  desvenlafaxine (PRISTIQ) 100 MG 24 hr tablet, 50 mg daily. Marland Kitchen  FLUoxetine (PROZAC) 40 MG capsule, Take 40 mg by mouth at bedtime. .  hydrOXYzine (ATARAX/VISTARIL) 10 MG tablet, TAKE 1 TABLET BY MOUTH THREE TIMES A DAY AS NEEDED .  hyoscyamine (LEVBID) 0.375 MG 12 hr tablet, TAKE 1 TABLET BY MOUTH TWICE A DAY .  methylphenidate 18 MG PO CR tablet, Take 18 mg by mouth every morning. .  ondansetron (ZOFRAN) 4 MG tablet, Take 1 tablet (4 mg total) by mouth every 6 (six) hours as needed for nausea or vomiting. Marland Kitchen  tiZANidine (ZANAFLEX) 2 MG tablet, Take 1 tablet (2 mg total) by mouth at bedtime. Marland Kitchen  UNABLE TO FIND,  CBD Gummies at bedtime .  Vitamin D, Ergocalciferol, (DRISDOL) 1.25 MG (50000 UNIT) CAPS capsule, TAKE 1 CAPSULE (50,000 UNITS TOTAL) BY MOUTH EVERY 7 (SEVEN) DAYS   Reviewed prior external information including notes and imaging from  primary care provider As well as notes that were available from care everywhere and other healthcare systems.  Past medical history, social, surgical and family history all reviewed in electronic medical record.  No pertanent information unless stated regarding to the chief complaint.   Review of Systems:  No headache, visual changes, nausea, vomiting, diarrhea, constipation, dizziness, abdominal pain, skin rash, fevers, chills, night sweats, weight loss, swollen lymph nodes, body aches, joint swelling, chest pain, shortness of breath, mood changes. POSITIVE muscle aches in neck   Objective  Blood pressure 104/60, pulse 76, height 5\' 4"  (1.626 m), weight 126 lb (57.2 kg), SpO2 99 %.   General: No apparent distress alert and oriented x3 mood and affect normal, dressed appropriately.  HEENT: Pupils equal, extraocular movements intact  Respiratory: Patient's speak in full sentences and does not appear short of breath  Cardiovascular: No lower extremity edema, non tender, no erythema  Gait normal with good balance and coordination.  MSK:  Neck exam loss of lordosis negative spurling, does have tightness with side bending. And rotation Full strength of UE   Osteopathic findings C2 flexed rotated and side bent right C4 flexed rotated and side bent left C6 flexed rotated and side bent left T3 extended rotated and side bent right inhaled third rib T9 extended rotated and side bent left     Impression and Recommendations:     The above documentation has been reviewed and is accurate and complete [name], DO

## 2021-01-02 NOTE — Telephone Encounter (Signed)
Patient called stating that she is not able to move her neck this morning and is in a lot of pain.  Dr Tamala Julian recommended a 5 day burst of Prednisone.  Informed patient. She asked if this could be sent to the [facility].

## 2021-01-03 ENCOUNTER — Encounter: Payer: Self-pay | Admitting: Family Medicine

## 2021-01-03 DIAGNOSIS — M999 Biomechanical lesion, unspecified: Secondary | ICD-10-CM | POA: Insufficient documentation

## 2021-01-03 DIAGNOSIS — M62838 Other muscle spasm: Secondary | ICD-10-CM | POA: Insufficient documentation

## 2021-01-03 NOTE — Assessment & Plan Note (Signed)
Decision today to treat with OMT was based on Physical Exam  After verbal consent patient was treated with HVLE, ME techniques in cervical and thoracic and rib areas  Patient tolerated the procedure well with improvement in symptoms  Patient given exercises, stretches and lifestyle modifications  See medications in patient instructions if given  Patient will follow up in 3 weeks

## 2021-01-03 NOTE — Assessment & Plan Note (Signed)
Discussed with patient, tightness noted, muscle relaxer given, short course prednisone.  RTC in 3 weeks. Call if worsening pain. No need for xray at moment

## 2021-01-07 ENCOUNTER — Ambulatory Visit: Payer: BC Managed Care – PPO | Admitting: Family Medicine

## 2021-01-07 ENCOUNTER — Encounter: Payer: Self-pay | Admitting: Family Medicine

## 2021-01-07 ENCOUNTER — Other Ambulatory Visit: Payer: Self-pay

## 2021-01-07 VITALS — BP 88/66 | HR 108 | Ht 64.0 in | Wt 124.0 lb

## 2021-01-07 DIAGNOSIS — M9902 Segmental and somatic dysfunction of thoracic region: Secondary | ICD-10-CM

## 2021-01-07 DIAGNOSIS — M9903 Segmental and somatic dysfunction of lumbar region: Secondary | ICD-10-CM

## 2021-01-07 DIAGNOSIS — M9901 Segmental and somatic dysfunction of cervical region: Secondary | ICD-10-CM | POA: Diagnosis not present

## 2021-01-07 DIAGNOSIS — M9908 Segmental and somatic dysfunction of rib cage: Secondary | ICD-10-CM | POA: Diagnosis not present

## 2021-01-07 DIAGNOSIS — M9904 Segmental and somatic dysfunction of sacral region: Secondary | ICD-10-CM | POA: Diagnosis not present

## 2021-01-07 DIAGNOSIS — M62838 Other muscle spasm: Secondary | ICD-10-CM

## 2021-01-07 NOTE — Assessment & Plan Note (Signed)
Some improvement from previous exam.  Did get significant range of motion from patient's previous visit.  Patient is going to continue with home exercises.  Does have the muscle relaxer if needed.  Discussed with patient about the possibility of prednisone again but hopefully will not need it.  If worsening pain will need advanced imaging.  Patient will follow up with me again in 4 weeks

## 2021-01-07 NOTE — Progress Notes (Signed)
[place] Phone: [phone] Subjective:   Amanda Amanda [name], am serving as a scribe for Dr. Hulan Saas. This visit occurred during the SARS-CoV-2 public health emergency.  Safety protocols were in place, including screening questions prior to the visit, additional usage of staff PPE, and extensive cleaning of exam room while observing appropriate contact time as indicated for disinfecting solutions.   I'm seeing this patient by the request  of:  [name], MD  CC: Neck pain follow-up  [id number]  Amanda Amanda [name] is a 50 y.o. female coming in with complaint of back and neck pain. OMT 01/02/2021. Patient states that her neck is still stiff. Patient has to rotate entire body to look over shoulder. Does feel improvement though from last visit.  Patient does state that she is feeling approximately 50% better overall.  Medications patient has been prescribed: Pred, Zanaflex Taking:         Reviewed prior external information including notes and imaging from previsou exam, outside providers and external EMR if available.   As well as notes that were available from care everywhere and other healthcare systems.  Past medical history, social, surgical and family history all reviewed in electronic medical record.  Amanda [name] pertanent information unless stated regarding to the chief complaint.   Past Medical History:  Diagnosis Date  . Anxiety   . Breast mass 04/2012   left breast  . Complication of anesthesia    states was hard to wake up post-op  . Depression   . Dry cough    since vocal cord surgery  . Sleep related teeth grinding    wears mouth guard at night    Allergies  Allergen Reactions  . Chloraprep One Step [Chlorhexidine Gluconate]   . Oxycodone Rash    Rash all over   . Sulfamethoxazole Nausea And Vomiting  . Vitamin E      Review of Systems:  Amanda [name] headache, visual changes, nausea, vomiting,  diarrhea, constipation, dizziness, abdominal pain, skin rash, fevers, chills, night sweats, weight loss, swollen lymph nodes, body aches, joint swelling, chest pain, shortness of breath, mood changes. POSITIVE muscle aches  Objective  Blood pressure (!) 88/66, pulse (!) 108, height 5\' 4"  (1.626 m), weight 124 lb (56.2 kg), SpO2 98 %.   General: Amanda [name] apparent distress alert and oriented x3 mood and affect normal, dressed appropriately.  HEENT: Pupils equal, extraocular movements intact  Respiratory: Patient's speak in full sentences and does not appear short of breath  Cardiovascular: Amanda [name] lower extremity edema, non tender, Amanda [name] erythema  Gait normal with good balance and coordination.  MSK:  Non tender with full range of motion and good stability and symmetric strength and tone of shoulders, elbows, wrist, hip, knee and ankles bilaterally.  Back - Normal skin, Spine with normal alignment and Amanda [name] deformity.  Amanda [name] tenderness to vertebral process palpation.  Paraspinous muscles are not tender and without spasm.   Range of motion is full at neck and lumbar sacral regions  Osteopathic findings  C4 flexed rotated and side bent right T4 extended rotated and side bent right inhaled rib T9 extended rotated and side bent left L2 flexed rotated and side bent right Sacrum right on right       Assessment and Plan:  Neck muscle spasm Some improvement from previous exam.  Did get significant range of motion from patient's previous visit.  Patient is going to continue with home exercises.  Does  have the muscle relaxer if needed.  Discussed with patient about the possibility of prednisone again but hopefully will not need it.  If worsening pain will need advanced imaging.  Patient will follow up with me again in 4 weeks    Nonallopathic problems  Decision today to treat with OMT was based on Physical Exam  After verbal consent patient was treated with HVLA, ME, FPR techniques in cervical, rib, thoracic,  lumbar, and sacral  areas  Patient tolerated the procedure well with improvement in symptoms  Patient given exercises, stretches and lifestyle modifications  See medications in patient instructions if given  Patient will follow up in 4 weeks      The above documentation has been reviewed and is accurate and complete Amanda Pulley, DO       Note: This dictation was prepared with Dragon dictation along with smaller phrase technology. Any transcriptional errors that result from this process are unintentional.

## 2021-01-07 NOTE — Patient Instructions (Signed)
Coop pillow Go to [employer] look at [employer] See me again in 4-5 weeks so we can have you perfect for [place]

## 2021-01-24 ENCOUNTER — Other Ambulatory Visit: Payer: Self-pay | Admitting: Family Medicine

## 2021-01-27 ENCOUNTER — Other Ambulatory Visit: Payer: Self-pay | Admitting: Family Medicine

## 2021-02-04 NOTE — Progress Notes (Signed)
[place] [address] [place] Phone: [phone] Subjective:   I [name] am serving as a Education administrator for Dr. Hulan Saas.  This visit occurred during the SARS-CoV-2 public health emergency.  Safety protocols were in place, including screening questions prior to the visit, additional usage of staff PPE, and extensive cleaning of exam room while observing appropriate contact time as indicated for disinfecting solutions.   I'm seeing this patient by the request  of:  [name], MD  CC: Neck and lower back pain follow-up  [id number]  MIESHA BACHMANN is a 50 y.o. female coming in with complaint of back and neck pain. OMT 01/07/2021. Patient states her neck is still bad. Pain is on the right side. Left glut pain.  Patient states his insulin over the tightness.  Medications patient has been prescribed: None          Reviewed prior external information including notes and imaging from previsou exam, outside providers and external EMR if available.   As well as notes that were available from care everywhere and other healthcare systems.  Past medical history, social, surgical and family history all reviewed in electronic medical record.  No pertanent information unless stated regarding to the chief complaint.   Past Medical History:  Diagnosis Date  . Anxiety   . Breast mass 04/2012   left breast  . Complication of anesthesia    states was hard to wake up post-op  . Depression   . Dry cough    since vocal cord surgery  . Sleep related teeth grinding    wears mouth guard at night    Allergies  Allergen Reactions  . Chloraprep One Step [Chlorhexidine Gluconate]   . Oxycodone Rash    Rash all over   . Sulfamethoxazole Nausea And Vomiting  . Vitamin E      Review of Systems:  No headache, visual changes, nausea, vomiting, diarrhea, constipation, dizziness, abdominal pain, skin rash, fevers, chills, night sweats,  weight loss, swollen lymph nodes, body aches, joint swelling, chest pain, shortness of breath, mood changes. POSITIVE muscle aches  Objective  Blood pressure 100/80, pulse 84, height 5\' 4"  (1.626 m), weight 125 lb (56.7 kg), SpO2 99 %.   General: No apparent distress alert and oriented x3 mood and affect normal, dressed appropriately.  HEENT: Pupils equal, extraocular movements intact  Respiratory: Patient's speak in full sentences and does not appear short of breath  Cardiovascular: No lower extremity edema, non tender, no erythema    Osteopathic findings  C2 flexed rotated and side bent right T8 extended rotated and side bent left L1 flexed rotated and side bent right Sacrum right on right       Assessment and Plan:  Neck muscle spasm Chronic problem still noted.  Patient still has the discomfort at this time.  Refilled the prednisone to have for any type of breakthrough.  [place]  Does have the Zanaflex.  Given the refill of the once weekly vitamin D.  Continue to stay active and follow-up with me again when she is back in 3 months  Low back pain Stable, chronic problem.  Discussed icing regimen and home exercises.  Increase activity slowly.  Follow-up again in 6 to 8 weeks    Nonallopathic problems  Decision today to treat with OMT was based on Physical Exam  After verbal consent patient was treated with HVLA, ME, FPR techniques in cervical,  thoracic,  lumbar, and sacral  areas  Patient tolerated the procedure well with improvement in symptoms  Patient given exercises, stretches and lifestyle modifications  See medications in patient instructions if given  Patient will follow up in 4-8 weeks      The above documentation has been reviewed and is accurate and complete [name], DO       Note: This dictation was prepared with Dragon dictation along with smaller phrase technology. Any transcriptional errors that result from this  process are unintentional.

## 2021-02-05 ENCOUNTER — Other Ambulatory Visit: Payer: Self-pay

## 2021-02-05 ENCOUNTER — Ambulatory Visit: Payer: BC Managed Care – PPO | Admitting: Family Medicine

## 2021-02-05 ENCOUNTER — Encounter: Payer: Self-pay | Admitting: Family Medicine

## 2021-02-05 VITALS — BP 100/80 | HR 84 | Ht 64.0 in | Wt 125.0 lb

## 2021-02-05 DIAGNOSIS — G8929 Other chronic pain: Secondary | ICD-10-CM

## 2021-02-05 DIAGNOSIS — M9901 Segmental and somatic dysfunction of cervical region: Secondary | ICD-10-CM | POA: Diagnosis not present

## 2021-02-05 DIAGNOSIS — M62838 Other muscle spasm: Secondary | ICD-10-CM

## 2021-02-05 DIAGNOSIS — M9903 Segmental and somatic dysfunction of lumbar region: Secondary | ICD-10-CM

## 2021-02-05 DIAGNOSIS — M9902 Segmental and somatic dysfunction of thoracic region: Secondary | ICD-10-CM | POA: Diagnosis not present

## 2021-02-05 DIAGNOSIS — M9904 Segmental and somatic dysfunction of sacral region: Secondary | ICD-10-CM

## 2021-02-05 DIAGNOSIS — M545 Low back pain, unspecified: Secondary | ICD-10-CM

## 2021-02-05 MED ORDER — VITAMIN D (ERGOCALCIFEROL) 1.25 MG (50000 UNIT) PO CAPS: 50000.0000 [IU] | ORAL_CAPSULE | ORAL | 0 refills | Status: DC

## 2021-02-05 MED ORDER — PREDNISONE 20 MG PO TABS
20.0000 mg | ORAL_TABLET | Freq: Every day | ORAL | 0 refills | Status: DC
Start: 2021-02-05 — End: 2022-10-20

## 2021-02-05 NOTE — Assessment & Plan Note (Signed)
Chronic problem still noted.  Patient still has the discomfort at this time.  Refilled the prednisone to have for any type of breakthrough.  [place]  Does have the Zanaflex.  Given the refill of the once weekly vitamin D.  Continue to stay active and follow-up with me again when she is back in 3 months

## 2021-02-05 NOTE — Patient Instructions (Addendum)
Good to see you Once weekly vitamin D for 3 months predinsone just in case Have fun in [place] See me again in 3 months

## 2021-02-05 NOTE — Assessment & Plan Note (Signed)
Stable, chronic problem.  Discussed icing regimen and home exercises.  Increase activity slowly.  Follow-up again in 6 to 8 weeks

## 2021-02-18 ENCOUNTER — Other Ambulatory Visit: Payer: Self-pay | Admitting: Family Medicine

## 2021-02-19 ENCOUNTER — Other Ambulatory Visit: Payer: Self-pay | Admitting: Family Medicine

## 2021-05-14 NOTE — Progress Notes (Signed)
[name] [place] Phone: [phone] Subjective:   Amanda [name], am serving as a scribe for Dr. Hulan Saas.  I'm seeing this patient by the request  of:  [name], MD  CC: Back pain follow-up, hand pain follow-up  [id number]  Amanda [name] is a 50 y.o. female coming in with complaint of back and neck pain. OMT 02/05/2021. Also f/u for R carpal tunnel. Injected 08/27/2020. Patient states that she woke up yesterday with pain in both wrists so bad. Patient has a brace on the right wrist.  Patient has had carpal tunnel previously.  Started having increasing discomfort and pain.  Waking her up at night.          .   Past Medical History:  Diagnosis Date   Anxiety    Breast mass 04/2012   left breast   Complication of anesthesia    states was hard to wake up post-op   Depression    Dry cough    since vocal cord surgery   Sleep related teeth grinding    wears mouth guard at night    Allergies  Allergen Reactions   Chloraprep One Step [Chlorhexidine Gluconate]    Oxycodone Rash    Rash all over    Sulfamethoxazole Nausea And Vomiting   Vitamin E      Review of Systems:  No headache, visual changes, nausea, vomiting, diarrhea, constipation, dizziness, abdominal pain, skin rash, fevers, chills, night sweats, weight loss, swollen lymph nodes, body aches, joint swelling, chest pain, shortness of breath, mood changes. POSITIVE muscle aches  Objective  There were no vitals taken for this visit.   General: No apparent distress alert and oriented x3 mood and affect normal, dressed appropriately.  HEENT: Pupils equal, extraocular movements intact  Respiratory: Patient's speak in full sentences and does not appear short of breath  Cardiovascular: No lower extremity edema, non tender, no erythema  Right wrist exam shows the patient does have a positive Tinel.  Patient does still have good thenar  eminence with no significant wasting.  Procedure: Real-time Ultrasound Guided Injection of right carpal tunnel Device: GE Logiq Q7  Ultrasound guided injection is preferred based studies that show increased duration, increased effect, greater accuracy, decreased procedural pain, increased response rate with ultrasound guided versus blind injection.  Verbal informed consent obtained.  Time-out conducted.  Noted no overlying erythema, induration, or other signs of local infection.  Skin prepped in a sterile fashion.  Local anesthesia: Topical Ethyl chloride.  With sterile technique and under real time ultrasound guidance:  median nerve visualized.  23g 5/8 inch needle inserted distal to proximal approach into nerve sheath. Pictures taken nfor needle placement. Patient did have injection of 0.5 cc of 0.5% Marcaine, and 0.5 cc of Kenalog 40 mg/dL. Completed without difficulty  Pain immediately resolved suggesting accurate placement of the medication.  Advised to call if fevers/chills, erythema, induration, drainage, or persistent bleeding.  Impression: Technically successful ultrasound guided injection.  Osteopathic findings  C2 flexed rotated and side bent right C6 flexed rotated and side bent left T3 extended rotated and side bent right inhaled rib T9 extended rotated and side bent left L2 flexed rotated and side bent right Sacrum right on right       Assessment and Plan:  Right carpal tunnel syndrome Repeat injection given again today.  Patient still wants to avoid any surgical intervention.  Responding relatively well to each injection.  Discussed icing regimen and home exercises.  Increase activity slowly.  Follow-up again 6 to 8 weeks may need to do an injection on the contralateral side  Low back pain Chronic problem with mild exacerbation.  Discussed which activities to do which wants to avoid.  Increase activity slowly.  Discussed icing regimen.  Follow-up again in 6 to 8  weeks   Nonallopathic problems  Decision today to treat with OMT was based on Physical Exam  After verbal consent patient was treated with HVLA, ME, FPR techniques in cervical, rib, thoracic, lumbar, and sacral  areas  Patient tolerated the procedure well with improvement in symptoms  Patient given exercises, stretches and lifestyle modifications  See medications in patient instructions if given  Patient will follow up in 4-8 weeks      The above documentation has been reviewed and is accurate and complete [name], DO       Note: This dictation was prepared with Dragon dictation along with smaller phrase technology. Any transcriptional errors that result from this process are unintentional.

## 2021-05-15 ENCOUNTER — Other Ambulatory Visit: Payer: Self-pay

## 2021-05-15 ENCOUNTER — Ambulatory Visit: Payer: BC Managed Care – PPO | Admitting: Family Medicine

## 2021-05-15 ENCOUNTER — Ambulatory Visit: Payer: Self-pay

## 2021-05-15 ENCOUNTER — Encounter: Payer: Self-pay | Admitting: Family Medicine

## 2021-05-15 VITALS — BP 102/70 | HR 91 | Ht 64.0 in | Wt 125.0 lb

## 2021-05-15 DIAGNOSIS — G5601 Carpal tunnel syndrome, right upper limb: Secondary | ICD-10-CM | POA: Diagnosis not present

## 2021-05-15 DIAGNOSIS — M9902 Segmental and somatic dysfunction of thoracic region: Secondary | ICD-10-CM

## 2021-05-15 DIAGNOSIS — G8929 Other chronic pain: Secondary | ICD-10-CM

## 2021-05-15 DIAGNOSIS — M9903 Segmental and somatic dysfunction of lumbar region: Secondary | ICD-10-CM | POA: Diagnosis not present

## 2021-05-15 DIAGNOSIS — M9904 Segmental and somatic dysfunction of sacral region: Secondary | ICD-10-CM | POA: Diagnosis not present

## 2021-05-15 DIAGNOSIS — M9901 Segmental and somatic dysfunction of cervical region: Secondary | ICD-10-CM

## 2021-05-15 DIAGNOSIS — M545 Low back pain, unspecified: Secondary | ICD-10-CM

## 2021-05-15 NOTE — Assessment & Plan Note (Signed)
Repeat injection given again today.  Patient still wants to avoid any surgical intervention.  Responding relatively well to each injection.  Discussed icing regimen and home exercises.  Increase activity slowly.  Follow-up again 6 to 8 weeks may need to do an injection on the contralateral side

## 2021-05-15 NOTE — Patient Instructions (Addendum)
Good to see you  Injection given today in right wrist   See me again in 6-8 for OMT

## 2021-05-15 NOTE — Assessment & Plan Note (Signed)
Chronic problem with mild exacerbation.  Discussed which activities to do which wants to avoid.  Increase activity slowly.  Discussed icing regimen.  Follow-up again in 6 to 8 weeks

## 2021-05-22 ENCOUNTER — Other Ambulatory Visit: Payer: Self-pay | Admitting: Obstetrics and Gynecology

## 2021-05-22 ENCOUNTER — Ambulatory Visit
Admission: RE | Admit: 2021-05-22 | Discharge: 2021-05-22 | Disposition: A | Discharge status: Home / Self Care | Payer: BC Managed Care – PPO | Source: Ambulatory Visit | Attending: Obstetrics and Gynecology | Admitting: Obstetrics and Gynecology

## 2021-05-22 ENCOUNTER — Other Ambulatory Visit: Payer: Self-pay

## 2021-05-22 DIAGNOSIS — N6320 Unspecified lump in the left breast, unspecified quadrant: Secondary | ICD-10-CM

## 2021-05-28 NOTE — Progress Notes (Signed)
[place] [address] [place] Phone: [phone] Subjective:   [name], am serving as a scribe for Dr. Hulan Saas. This visit occurred during the SARS-CoV-2 public health emergency.  Safety protocols were in place, including screening questions prior to the visit, additional usage of staff PPE, and extensive cleaning of exam room while observing appropriate contact time as indicated for disinfecting solutions.   I'm seeing this patient by the request  of:  [name], MD  CC: neck and left wrist pain   [id number]  Amanda [name] is a 49 y.o. female coming in with complaint of right wrist pain. Wants injections. Left wrist pain like the right was patient states that is having some tingling.  Patient is concerned that this may worsen.  Does respond well to the injection      Past Medical History:  Diagnosis Date   Anxiety    Breast mass 04/2012   left breast   Complication of anesthesia    states was hard to wake up post-op   Depression    Dry cough    since vocal cord surgery   Sleep related teeth grinding    wears mouth guard at night   Past Surgical History:  Procedure Laterality Date   AUGMENTATION MAMMAPLASTY Bilateral 2014   BREAST MASS EXCISION Left 05/26/2012   CERVIX SURGERY  2005   papilloma   UMBILICAL HERNIA REPAIR  as a child   UHR   vocal cord polyp excision  1999   Social History   Socioeconomic History   Marital status: Married    Spouse name: Not on file   Number of children: 1   Years of education: Not on file   Highest education level: Not on file  Occupational History   Occupation: Spanish Professor  Tobacco Use   Smoking status: Every Day    Years: 25.00    Types: Cigarettes   Smokeless tobacco: Never   Tobacco comments:    smokes 3 cig./day  Substance and Sexual Activity   Alcohol use: Yes    Alcohol/week: 0.0 standard drinks    Comment: occasional wine   Drug use: No    Sexual activity: Not on file  Other Topics Concern   Not on file  Social History Narrative   Not on file   Social Determinants of Health   Financial Resource Strain: Not on file  Food Insecurity: Not on file  Transportation Needs: Not on file  Physical Activity: Not on file  Stress: Not on file  Social Connections: Not on file   Allergies  Allergen Reactions   Chloraprep One Step [Chlorhexidine Gluconate]    Oxycodone Rash    Rash all over    Sulfamethoxazole Nausea And Vomiting   Vitamin E    Family History  Problem Relation Age of Onset   Breast cancer Mother 33   Depression Father    Brain cancer Maternal Uncle    Leukemia Maternal Grandmother    Diabetes Maternal Grandfather    Depression Paternal Grandfather    ADD / ADHD Son    Depression Nephew     Current Outpatient Medications (Endocrine & Metabolic):    predniSONE (DELTASONE) 20 MG tablet, Take 1 tablet (20 mg total) by mouth daily with breakfast. (Patient not taking: Reported on 05/15/2021)   predniSONE (DELTASONE) 20 MG tablet, Take 1 tablet (20 mg total) by mouth daily with breakfast. (Patient not taking: Reported on 05/15/2021)  Current Outpatient Medications (Other):    amphetamine-dextroamphetamine (ADDERALL XR) 10 MG 24 hr capsule, Take 1 capsule (10 mg total) by mouth daily.   buPROPion (WELLBUTRIN XL) 150 MG 24 hr tablet, Take 150 mg by mouth daily. (Patient not taking: Reported on 05/15/2021)   desvenlafaxine (PRISTIQ) 100 MG 24 hr tablet, 50 mg daily.   FLUoxetine (PROZAC) 40 MG capsule, Take 40 mg by mouth at bedtime.   hydrOXYzine (ATARAX/VISTARIL) 10 MG tablet, TAKE 1 TABLET BY MOUTH THREE TIMES A DAY AS NEEDED (Patient not taking: Reported on 05/15/2021)   hyoscyamine (LEVBID) 0.375 MG 12 hr tablet, TAKE 1 TABLET BY MOUTH TWICE A DAY   methylphenidate 18 MG PO CR tablet, Take 18 mg by mouth every morning.   ondansetron (ZOFRAN) 4 MG tablet, Take 1 tablet (4 mg total) by mouth every 6  (six) hours as needed for nausea or vomiting. (Patient not taking: Reported on 05/15/2021)   tiZANidine (ZANAFLEX) 2 MG tablet, TAKE 1 TABLET BY MOUTH EVERYDAY AT BEDTIME (Patient not taking: Reported on 05/15/2021)   UNABLE TO FIND, CBD Gummies at bedtime   Vitamin D, Ergocalciferol, (DRISDOL) 1.25 MG (50000 UNIT) CAPS capsule, TAKE 1 CAPSULE (50,000 UNITS TOTAL) BY MOUTH EVERY 7 (SEVEN) DAYS   Reviewed prior external information including notes and imaging from  primary care provider As well as notes that were available from care everywhere and other healthcare systems.  Past medical history, social, surgical and family history all reviewed in electronic medical record.  No pertanent information unless stated regarding to the chief complaint.   Review of Systems:  No headache, visual changes, nausea, vomiting, diarrhea, constipation, dizziness, abdominal pain, skin rash, fevers, chills, night sweats, weight loss, swollen lymph nodes, body aches, joint swelling, chest pain, shortness of breath, mood changes. POSITIVE muscle aches  Objective  Blood pressure 106/64, pulse 89, height '5\' 4"'$  (1.626 m), weight 125 lb (56.7 kg), SpO2 98 %.   General: No apparent distress alert and oriented x3 mood and affect normal, dressed appropriately.  HEENT: Pupils equal, extraocular movements intact  Respiratory: Patient's speak in full sentences and does not appear short of breath  Cardiovascular: No lower extremity edema, non tender, no erythema  Gait normal with good balance and coordination.  MSK: Patient does have a positive Tinel's on the left side.  Less positive on the right side.  Patient does have positive Phalen's on the left side as well but good grip strength.. Patient neck exam does have some mild loss of lordosis.  Patient does have tightness in the parascapular region bilaterally actually today.  Patient does have some tenderness also in the thoracolumbar juncture.  Procedure: Real-time  Ultrasound Guided Injection of left carpal tunnel Device: GE Logiq Q7 Ultrasound guided injection is preferred based studies that show increased duration, increased effect, greater accuracy, decreased procedural pain, increased response rate with ultrasound guided versus blind injection.  Verbal informed consent obtained.  Time-out conducted.  Noted no overlying erythema, induration, or other signs of local infection.  Skin prepped in a sterile fashion.  Local anesthesia: Topical Ethyl chloride.  With sterile technique and under real time ultrasound guidance:  median nerve visualized.  23g 5/8 inch needle inserted distal to proximal approach into nerve sheath. Pictures taken nfor needle placement. Patient did have injection of 0.5 cc of 0.5% Marcaine, and 0.5 cc of Kenalog 40 mg/dL. Completed without difficulty  Pain immediately resolved suggesting accurate placement of the medication.  Advised to call if fevers/chills, erythema, induration,  drainage, or persistent bleeding.  Impression: Technically successful ultrasound guided injection.  Osteopathic findings C5 F RS right T8 extended rotated and side bent left L1 flexed rotated and side bent right  sacrum right on right   Impression and Recommendations:     The above documentation has been reviewed and is accurate and complete [name], DO

## 2021-05-29 ENCOUNTER — Encounter: Payer: Self-pay | Admitting: Family Medicine

## 2021-05-29 ENCOUNTER — Other Ambulatory Visit: Payer: Self-pay

## 2021-05-29 ENCOUNTER — Ambulatory Visit: Payer: BC Managed Care – PPO | Admitting: Family Medicine

## 2021-05-29 ENCOUNTER — Ambulatory Visit: Payer: Self-pay

## 2021-05-29 VITALS — BP 106/64 | HR 89 | Ht 64.0 in | Wt 125.0 lb

## 2021-05-29 DIAGNOSIS — M9908 Segmental and somatic dysfunction of rib cage: Secondary | ICD-10-CM

## 2021-05-29 DIAGNOSIS — G5601 Carpal tunnel syndrome, right upper limb: Secondary | ICD-10-CM

## 2021-05-29 DIAGNOSIS — M9902 Segmental and somatic dysfunction of thoracic region: Secondary | ICD-10-CM

## 2021-05-29 DIAGNOSIS — M9904 Segmental and somatic dysfunction of sacral region: Secondary | ICD-10-CM

## 2021-05-29 DIAGNOSIS — M62838 Other muscle spasm: Secondary | ICD-10-CM | POA: Diagnosis not present

## 2021-05-29 DIAGNOSIS — M9901 Segmental and somatic dysfunction of cervical region: Secondary | ICD-10-CM | POA: Diagnosis not present

## 2021-05-29 DIAGNOSIS — G5602 Carpal tunnel syndrome, left upper limb: Secondary | ICD-10-CM

## 2021-05-29 DIAGNOSIS — M9903 Segmental and somatic dysfunction of lumbar region: Secondary | ICD-10-CM | POA: Diagnosis not present

## 2021-05-29 NOTE — Assessment & Plan Note (Signed)
Tightness of the neck noted.  Discussed with patient icing regimen and home exercises.  Increase activity slowly.  Follow-up with me again in 6 to 8 weeks

## 2021-05-29 NOTE — Patient Instructions (Signed)
Injection in left carpal tunnel Tried to make ouchy go away See you again in 6-8 weeks

## 2021-05-29 NOTE — Assessment & Plan Note (Signed)
Patient given injection and tolerated the procedure well.  Discussed icing regimen and home exercises.  Increase activity slowly.  Patient knows that should do relatively well.  Follow-up again in 4 to 8 weeks

## 2021-07-03 ENCOUNTER — Ambulatory Visit: Payer: BC Managed Care – PPO | Admitting: Family Medicine

## 2021-07-22 ENCOUNTER — Ambulatory Visit: Payer: BC Managed Care – PPO | Admitting: Family Medicine

## 2021-08-26 ENCOUNTER — Encounter: Payer: Self-pay | Admitting: Family Medicine

## 2021-08-27 NOTE — Progress Notes (Signed)
[facility]ach [facility] [place] [address] [place] Phone: [phone] Subjective:   [name], am serving as a scribe for Dr. Hulan Saas. This visit occurred during the SARS-CoV-2 public health emergency.  Safety protocols were in place, including screening questions prior to the visit, additional usage of staff PPE, and extensive cleaning of exam room while observing appropriate contact time as indicated for disinfecting solutions.   I'm seeing this patient by the request  of:  [name], MD  CC: Carpal tunnel follow-up  [id number]  Amanda [name] is a 50 y.o. female coming in with complaint of back and neck pain. OMT in 05/29/2021. Carpal tunnel on right: Patient given injection and tolerated the procedure well.  Discussed icing regimen and home exercises.  Increase activity slowly.  Patient knows that should do relatively well.  Follow-up again in 4 to 8 weeks Patient states hand and foot pain on the left side. Started to get worse within the last couple of weeks. No new complaints.  Medications patient has been prescribed: None         Reviewed prior external information including notes and imaging from previsou exam, outside providers and external EMR if available.   As well as notes that were available from care everywhere and other healthcare systems.  Past medical history, social, surgical and family history all reviewed in electronic medical record.  No pertanent information unless stated regarding to the chief complaint.   Past Medical History:  Diagnosis Date   Anxiety    Breast mass 04/2012   left breast   Complication of anesthesia    states was hard to wake up post-op   Depression    Dry cough    since vocal cord surgery   Sleep related teeth grinding    wears mouth guard at night    Allergies  Allergen Reactions   Chloraprep One Step [Chlorhexidine Gluconate]    Oxycodone Rash    Rash all over     Sulfamethoxazole Nausea And Vomiting   Vitamin E      Review of Systems:  No headache, visual changes, nausea, vomiting, diarrhea, constipation, dizziness, abdominal pain, skin rash, fevers, chills, night sweats, weight loss, swollen lymph nodes, body aches, joint swelling, chest pain, shortness of breath, mood changes. POSITIVE muscle aches  Objective  Blood pressure 120/78, pulse 78, height 5\' 4"  (1.626 m), weight 129 lb (58.5 kg), SpO2 98 %.   General: No apparent distress alert and oriented x3 mood and affect normal, dressed appropriately.  HEENT: Pupils equal, extraocular movements intact  Respiratory: Patient's speak in full sentences and does not appear short of breath  Cardiovascular: No lower extremity edema, non tender, no erythema  Left wrist exam does have some tenderness to palpation over the carpal tunnel.  Positive Tinel's noted today.  Good grip strength.  No thenar eminence wasting  Low back exam does have some loss of lordosis.  Tightness with FABER test bilaterally.  5 out of 5 strength in lower extremities though.  Left foot exam does have some pain with compression.  Positive squeeze test.  Seems to be more tender in the third and fourth metatarsal heads.  Procedure: Real-time Ultrasound Guided Injection of left carpal tunnel Device: GE Logiq Q7 Ultrasound guided injection is preferred based studies that show increased duration, increased effect, greater accuracy, decreased procedural pain, increased response rate with ultrasound guided versus blind injection.  Verbal informed consent obtained.  Time-out conducted.  Noted  no overlying erythema, induration, or other signs of local infection.  Skin prepped in a sterile fashion.  Local anesthesia: Topical Ethyl chloride.  With sterile technique and under real time ultrasound guidance:  median nerve visualized.  23g 5/8 inch needle inserted distal to proximal approach into nerve sheath. Pictures taken nfor needle  placement.  0.5 cc of 0.5% Marcaine and 0.5 cc of Kenalog 40 mg/mL used Completed without difficulty  Pain immediately resolved suggesting accurate placement of the medication.  Advised to call if fevers/chills, erythema, induration, drainage, or persistent bleeding.  Impression: Technically successful ultrasound guided injection.  Osteopathic findings  C5 flexed rotated and side bent left T5 extended rotated and side bent right L2 flexed rotated and side bent right Sacrum right on right  Procedure: Real-time Ultrasound Guided Injection of neuroma left foot Device: GE Logiq Q7 Ultrasound guided injection is preferred based studies that show increased duration, increased effect, greater accuracy, decreased procedural pain, increased response rate, and decreased cost with ultrasound guided versus blind injection.  Verbal informed consent obtained.  Time-out conducted.  Noted no overlying erythema, induration, or other signs of local infection.  Skin prepped in a sterile fashion.  Local anesthesia: Topical Ethyl chloride.  With sterile technique and under real time ultrasound guidance: With a 25-gauge half inch needle patient was injected with 0.5 cc of 0.5% Marcaine and 0.5 cc of Kenalog 40 mg/mL. Completed without difficulty  Pain immediately resolved suggesting accurate placement of the medication.  Advised to call if fevers/chills, erythema, induration, drainage, or persistent bleeding.  Impression: Technically successful ultrasound guided injection.     Assessment and Plan:  Left carpal tunnel syndrome Patient responded well.  Did have numbness.  Will be driving on the contralateral side.  Discussed which activities to do which wants to avoid.  Increase activity.  Patient wants to avoid surgical intervention still at this time.  Follow-up again in 4 to 8 weeks  Morton's neuroma of left foot Repeat injection given today, tolerated the procedure well, discussed icing regimen and  home exercises.  Discussed avoiding certain activities discussed proper shoes.  Patient hopefully will respond well follow-up with me again in 6 weeks  Low back pain Chronic problem with mild exacerbation.  Responded extremely well to osteopathic manipulation.  Patient wants to avoid anything else more aggressive if possible.  Patient is to continue with more core strengthening.  Chronic problem with exacerbation is likely secondary to discomfort.  Follow-up again in 6 to 8 weeks   Nonallopathic problems  Decision today to treat with OMT was based on Physical Exam  After verbal consent patient was treated with HVLA, ME, FPR techniques in cervical, thoracic, lumbar, and sacral  areas  Patient tolerated the procedure well with improvement in symptoms  Patient given exercises, stretches and lifestyle modifications  See medications in patient instructions if given  Patient will follow up in 4-8 weeks      The above documentation has been reviewed and is accurate and complete Amanda Pulley, DO        Note: This dictation was prepared with Dragon dictation along with smaller phrase technology. Any transcriptional errors that result from this process are unintentional.

## 2021-08-28 ENCOUNTER — Ambulatory Visit: Payer: Self-pay

## 2021-08-28 ENCOUNTER — Other Ambulatory Visit: Payer: Self-pay

## 2021-08-28 ENCOUNTER — Ambulatory Visit: Payer: BC Managed Care – PPO | Admitting: Family Medicine

## 2021-08-28 VITALS — BP 120/78 | HR 78 | Ht 64.0 in | Wt 129.0 lb

## 2021-08-28 DIAGNOSIS — M9903 Segmental and somatic dysfunction of lumbar region: Secondary | ICD-10-CM | POA: Diagnosis not present

## 2021-08-28 DIAGNOSIS — G8929 Other chronic pain: Secondary | ICD-10-CM

## 2021-08-28 DIAGNOSIS — M545 Low back pain, unspecified: Secondary | ICD-10-CM | POA: Diagnosis not present

## 2021-08-28 DIAGNOSIS — M9902 Segmental and somatic dysfunction of thoracic region: Secondary | ICD-10-CM

## 2021-08-28 DIAGNOSIS — G5602 Carpal tunnel syndrome, left upper limb: Secondary | ICD-10-CM | POA: Diagnosis not present

## 2021-08-28 DIAGNOSIS — G5762 Lesion of plantar nerve, left lower limb: Secondary | ICD-10-CM

## 2021-08-28 DIAGNOSIS — M9901 Segmental and somatic dysfunction of cervical region: Secondary | ICD-10-CM

## 2021-08-28 DIAGNOSIS — M9904 Segmental and somatic dysfunction of sacral region: Secondary | ICD-10-CM | POA: Diagnosis not present

## 2021-08-28 NOTE — Assessment & Plan Note (Signed)
Repeat injection given today, tolerated the procedure well, discussed icing regimen and home exercises.  Discussed avoiding certain activities discussed proper shoes.  Patient hopefully will respond well follow-up with me again in 6 weeks

## 2021-08-28 NOTE — Patient Instructions (Signed)
Good to see you! Poked a couple of holes in you See you again in 6-8 weeks

## 2021-08-28 NOTE — Assessment & Plan Note (Signed)
Patient responded well.  Did have numbness.  Will be driving on the contralateral side.  Discussed which activities to do which wants to avoid.  Increase activity.  Patient wants to avoid surgical intervention still at this time.  Follow-up again in 4 to 8 weeks

## 2021-08-28 NOTE — Assessment & Plan Note (Signed)
Chronic problem with mild exacerbation.  Responded extremely well to osteopathic manipulation.  Patient wants to avoid anything else more aggressive if possible.  Patient is to continue with more core strengthening.  Chronic problem with exacerbation is likely secondary to discomfort.  Follow-up again in 6 to 8 weeks

## 2021-09-05 ENCOUNTER — Telehealth: Payer: Self-pay | Admitting: Family Medicine

## 2021-09-05 NOTE — Telephone Encounter (Signed)
Patient called asking for a refill on the prescription Vitamin D, Ergocalciferol, (DRISDOL) 1.25 MG (50000 UNIT) CAPS capsule to be sent to [facility] on [address].  Please advise.

## 2021-09-07 MED ORDER — VITAMIN D (ERGOCALCIFEROL) 1.25 MG (50000 UNIT) PO CAPS: 50000.0000 [IU] | ORAL_CAPSULE | ORAL | 0 refills | Status: DC

## 2021-10-08 NOTE — Progress Notes (Signed)
[name] [address] [place] Phone: [phone] Subjective:   [name], am serving as a scribe for Dr. Hulan Saas. This visit occurred during the SARS-CoV-2 public health emergency.  Safety protocols were in place, including screening questions prior to the visit, additional usage of staff PPE, and extensive cleaning of exam room while observing appropriate contact time as indicated for disinfecting solutions.   I'm seeing this patient by the request  of:  [name], MD  CC:   [id number]  08/28/2021 Patient responded well.  Did have numbness.  Will be driving on the contralateral side.  Discussed which activities to do which wants to avoid.  Increase activity.  Patient wants to avoid surgical intervention still at this time.  Follow-up again in 4 to 8 weeks  Repeat injection given today, tolerated the procedure well, discussed icing regimen and home exercises.  Discussed avoiding certain activities discussed proper shoes.  Patient hopefully will respond well follow-up with me again in 6 weeks  Chronic problem with mild exacerbation.  Responded extremely well to osteopathic manipulation.  Patient wants to avoid anything else more aggressive if possible.  Patient is to continue with more core strengthening.  Chronic problem with exacerbation is likely secondary to discomfort.  Follow-up again in 6 to 8 weeks  Updated 10/09/2021 Amanda [name] is a 51 y.o. female coming in with complaint of foot and wrist pain. Also here for OMT foot and wrist doing better. No pain. Manipulations wise more pain low back and gluteal area. Wants to talk about her vertigo. No other complaints.       Past Medical History:  Diagnosis Date   Anxiety    Breast mass 04/2012   left breast   Complication of anesthesia    states was hard to wake up post-op   Depression    Dry cough    since vocal cord surgery   Sleep related teeth grinding     wears mouth guard at night   Past Surgical History:  Procedure Laterality Date   AUGMENTATION MAMMAPLASTY Bilateral 2014   BREAST MASS EXCISION Left 05/26/2012   CERVIX SURGERY  2005   papilloma   UMBILICAL HERNIA REPAIR  as a child   UHR   vocal cord polyp excision  1999   Social History   Socioeconomic History   Marital status: Married    Spouse name: Not on file   Number of children: 1   Years of education: Not on file   Highest education level: Not on file  Occupational History   Occupation: Spanish Professor  Tobacco Use   Smoking status: Every Day    Years: 25.00    Types: Cigarettes   Smokeless tobacco: Never   Tobacco comments:    smokes 3 cig./day  Substance and Sexual Activity   Alcohol use: Yes    Alcohol/week: 0.0 standard drinks    Comment: occasional wine   Drug use: No   Sexual activity: Not on file  Other Topics Concern   Not on file  Social History Narrative   Not on file   Social Determinants of Health   Financial Resource Strain: Not on file  Food Insecurity: Not on file  Transportation Needs: Not on file  Physical Activity: Not on file  Stress: Not on file  Social Connections: Not on file   Allergies  Allergen Reactions   Chloraprep One Step [Chlorhexidine Gluconate]    Oxycodone Rash  Rash all over    Sulfamethoxazole Nausea And Vomiting   Vitamin E    Family History  Problem Relation Age of Onset   Breast cancer Mother 31   Depression Father    Brain cancer Maternal Uncle    Leukemia Maternal Grandmother    Diabetes Maternal Grandfather    Depression Paternal Grandfather    ADD / ADHD Son    Depression Nephew     Current Outpatient Medications (Endocrine & Metabolic):    predniSONE (DELTASONE) 20 MG tablet, Take 1 tablet (20 mg total) by mouth daily with breakfast. (Patient not taking: Reported on 05/15/2021)   predniSONE (DELTASONE) 20 MG tablet, Take 1 tablet (20 mg total) by mouth daily with breakfast. (Patient not  taking: Reported on 05/15/2021)      Current Outpatient Medications (Other):    amphetamine-dextroamphetamine (ADDERALL XR) 10 MG 24 hr capsule, Take 1 capsule (10 mg total) by mouth daily.   buPROPion (WELLBUTRIN XL) 150 MG 24 hr tablet, Take 150 mg by mouth daily. (Patient not taking: Reported on 05/15/2021)   desvenlafaxine (PRISTIQ) 100 MG 24 hr tablet, 50 mg daily.   FLUoxetine (PROZAC) 40 MG capsule, Take 40 mg by mouth at bedtime.   hydrOXYzine (ATARAX/VISTARIL) 10 MG tablet, TAKE 1 TABLET BY MOUTH THREE TIMES A DAY AS NEEDED (Patient not taking: Reported on 05/15/2021)   hyoscyamine (LEVBID) 0.375 MG 12 hr tablet, TAKE 1 TABLET BY MOUTH TWICE A DAY   methylphenidate 18 MG PO CR tablet, Take 18 mg by mouth every morning.   ondansetron (ZOFRAN) 4 MG tablet, Take 1 tablet (4 mg total) by mouth every 6 (six) hours as needed for nausea or vomiting. (Patient not taking: Reported on 05/15/2021)   tiZANidine (ZANAFLEX) 2 MG tablet, TAKE 1 TABLET BY MOUTH EVERYDAY AT BEDTIME (Patient not taking: Reported on 05/15/2021)   UNABLE TO FIND, CBD Gummies at bedtime   Vitamin D, Ergocalciferol, (DRISDOL) 1.25 MG (50000 UNIT) CAPS capsule, Take 1 capsule (50,000 Units total) by mouth every 7 (seven) days.   Reviewed prior external information including notes and imaging from  primary care provider As well as notes that were available from care everywhere and other healthcare systems.  Past medical history, social, surgical and family history all reviewed in electronic medical record.  No pertanent information unless stated regarding to the chief complaint.   Review of Systems:  No headache, visual changes, nausea, vomiting, diarrhea, constipation, dizziness, abdominal pain, skin rash, fevers, chills, night sweats, weight loss, swollen lymph nodes, body aches, joint swelling, chest pain, shortness of breath, mood changes. POSITIVE muscle aches  Objective  Blood pressure 116/80, pulse 91, height 5\' 4"   (1.626 m), weight 130 lb (59 kg), SpO2 99 %.   General: No apparent distress alert and oriented x3 mood and affect normal, dressed appropriately.  HEENT: Pupils equal, extraocular movements intact no nystagmus noted Respiratory: Patient's speak in full sentences and does not appear short of breath  Cardiovascular: No lower extremity edema, non tender, no erythema  Gait normal with good balance and coordination.  MSK:  Non tender with full range of motion and good stability and symmetric strength and tone of shoulders, elbows, wrist, hip, knee and ankles bilaterally.  Back exam mild loss of lordosis.  Tenderness to palpation of the paraspinal musculature.  Seems to be more right sacroiliac joint.  Positive FABER test noted on the right.  Negative straight leg test but still has some tightness of the hip flexors bilaterally.  Osteopathic findings  C5 flexed rotated and side bent left T3 extended rotated and side bent right inhaled third rib T6 extended rotated and side bent left L2 flexed rotated and side bent right Sacrum right on right    Impression and Recommendations:     The above documentation has been reviewed and is accurate and complete [name], DO

## 2021-10-09 ENCOUNTER — Other Ambulatory Visit: Payer: Self-pay

## 2021-10-09 ENCOUNTER — Ambulatory Visit: Payer: BC Managed Care – PPO | Admitting: Family Medicine

## 2021-10-09 VITALS — BP 116/80 | HR 91 | Ht 64.0 in | Wt 130.0 lb

## 2021-10-09 DIAGNOSIS — M999 Biomechanical lesion, unspecified: Secondary | ICD-10-CM | POA: Diagnosis not present

## 2021-10-09 DIAGNOSIS — M9901 Segmental and somatic dysfunction of cervical region: Secondary | ICD-10-CM

## 2021-10-09 DIAGNOSIS — M9908 Segmental and somatic dysfunction of rib cage: Secondary | ICD-10-CM

## 2021-10-09 DIAGNOSIS — M9904 Segmental and somatic dysfunction of sacral region: Secondary | ICD-10-CM

## 2021-10-09 DIAGNOSIS — M9903 Segmental and somatic dysfunction of lumbar region: Secondary | ICD-10-CM

## 2021-10-09 DIAGNOSIS — M9902 Segmental and somatic dysfunction of thoracic region: Secondary | ICD-10-CM

## 2021-10-09 DIAGNOSIS — G8929 Other chronic pain: Secondary | ICD-10-CM

## 2021-10-09 DIAGNOSIS — M545 Low back pain, unspecified: Secondary | ICD-10-CM

## 2021-10-09 NOTE — Assessment & Plan Note (Signed)
Improvement noted.  Not having as much difficulty as previously.  Discussed icing regimen and home exercises.  Discussed which activities to do which wants to avoid.  Patient will follow up with me again in 6 weeks

## 2021-10-09 NOTE — Patient Instructions (Signed)
Flonase twice a day for 2 weeks

## 2021-10-09 NOTE — Assessment & Plan Note (Signed)

## 2021-11-12 NOTE — Progress Notes (Signed)
Amanda [name] [place] Phone: [phone] Subjective:   [name], am serving as a scribe for Dr. Hulan Saas.  This visit occurred during the SARS-CoV-2 public health emergency.  Safety protocols were in place, including screening questions prior to the visit, additional usage of staff PPE, and extensive cleaning of exam room while observing appropriate contact time as indicated for disinfecting solutions.    I'm seeing this patient by the request  of:  [name], MD  CC: Neck and back pain, foot and wrist pain  [id number]  Amanda [name] is a 51 y.o. female coming in with complaint of back and neck pain. OMT on 10/09/2021. Back pain has been ok. Mornings she is stiff but loosens with movement.   Patient states that she is unable to sleep due to pain in R wrist.   Pain on bottom of foot is worse with walking. Pain suddenly increases in metatarsal heads.   Medications patient has been prescribed: Vit D  Taking:         Reviewed prior external information including notes and imaging from previsou exam, outside providers and external EMR if available.   As well as notes that were available from care everywhere and other healthcare systems.  Past medical history, social, surgical and family history all reviewed in electronic medical record.  No pertanent information unless stated regarding to the chief complaint.   Past Medical History:  Diagnosis Date   Anxiety    Breast mass 04/2012   left breast   Complication of anesthesia    states was hard to wake up post-op   Depression    Dry cough    since vocal cord surgery   Sleep related teeth grinding    wears mouth guard at night    Allergies  Allergen Reactions   Chloraprep One Step [Chlorhexidine Gluconate]    Oxycodone Rash    Rash all over    Sulfamethoxazole Nausea And Vomiting   Vitamin E      Review of Systems:  No headache, visual  changes, nausea, vomiting, diarrhea, constipation, dizziness, abdominal pain, skin rash, fevers, chills, night sweats, weight loss, swollen lymph nodes, body aches, joint swelling, chest pain, shortness of breath, mood changes. POSITIVE muscle aches  Objective  Blood pressure 104/72, pulse 84, height 5\' 4"  (1.626 m), weight 130 lb (59 kg), SpO2 98 %.   General: No apparent distress alert and oriented x3 mood and affect normal, dressed appropriately.  HEENT: Pupils equal, extraocular movements intact  Respiratory: Patient's speak in full sentences and does not appear short of breath  Cardiovascular: No lower extremity edema, non tender, no erythema  Right wrist exam does have a positive Tinel's sign noted.  Patient does have tenderness to palpation.  Good grip strength are noted.  Osteopathic findings  C2 flexed rotated and side bent right C6 flexed rotated and side bent left T3 extended rotated and side bent right inhaled rib T9 extended rotated and side bent left L2 flexed rotated and side bent right Sacrum right on right  Procedure: Real-time Ultrasound Guided Injection of right carpal tunnel Device: GE Logiq Q7 Ultrasound guided injection is preferred based studies that show increased duration, increased effect, greater accuracy, decreased procedural pain, increased response rate with ultrasound guided versus blind injection.  Verbal informed consent obtained.  Time-out conducted.  Noted no overlying erythema, induration, or other signs of local infection.  Skin prepped in a sterile fashion.  Local anesthesia: Topical Ethyl chloride.  With sterile technique and under real time ultrasound guidance:  median nerve visualized.  23g 5/8 inch needle inserted distal to proximal approach into nerve sheath. Pictures taken nfor needle placement. Patient did have injection of 0.5 cc of 0.5% Marcaine, and 0.5 cc of Kenalog 40 mg/dL. Completed without difficulty  Pain immediately resolved  suggesting accurate placement of the medication.  Advised to call if fevers/chills, erythema, induration, drainage, or persistent bleeding.  Impression: Technically successful ultrasound guided injection.  Procedure: Real-time Ultrasound Guided Injection of left foot neuroma Device: GE Logiq Q7 Ultrasound guided injection is preferred based studies that show increased duration, increased effect, greater accuracy, decreased procedural pain, increased response rate, and decreased cost with ultrasound guided versus blind injection.  Verbal informed consent obtained.  Time-out conducted.  Noted no overlying erythema, induration, or other signs of local infection.  Skin prepped in a sterile fashion.  Local anesthesia: Topical Ethyl chloride.  With sterile technique and under real time ultrasound guidance: With a 25-gauge half inch needle injected with 0.5 cc of 0.5% Marcaine and 0.5 cc of Kenalog 40 mg/mL Completed without difficulty  Pain immediately resolved suggesting accurate placement of the medication.  Advised to call if fevers/chills, erythema, induration, drainage, or persistent bleeding.  Impression: Technically successful ultrasound guided injection.    Assessment and Plan:   Right carpal tunnel syndrome Chronic problem with exacerbation.  Discussed icing regimen and home exercises, discussed which activities to do and which ones to avoid: Increase activity slowly.  Discussed icing regimen.  Follow-up with me again in 6 to 8 weeks.  Low back pain Chronic, with exacerbation.  Has been sometime since we have seen patient.  Increase activity slowly.  Discussed icing regimen and home exercises, increase activity slowly.  Follow-up again in 6 to 8 weeks.  Morton's neuroma of left foot Repeat injection given today.  Chronic problem with exacerbation as well.  Has had this difficulty previously and has responded well to the injection.  Been on 3 months since the injection.  Discussed  icing regimen and home exercises otherwise.  Follow-up again in 6 to 8 weeks.  Nonallopathic problems  Decision today to treat with OMT was based on Physical Exam  After verbal consent patient was treated with HVLA, ME, FPR techniques in cervical, rib, thoracic, lumbar, and sacral  areas  Patient tolerated the procedure well with improvement in symptoms  Patient given exercises, stretches and lifestyle modifications  See medications in patient instructions if given  Patient will follow up in 4-8 weeks     The above documentation has been reviewed and is accurate and complete [name], DO        Note: This dictation was prepared with Dragon dictation along with smaller phrase technology. Any transcriptional errors that result from this process are unintentional.

## 2021-11-13 ENCOUNTER — Other Ambulatory Visit: Payer: Self-pay

## 2021-11-13 ENCOUNTER — Ambulatory Visit: Payer: Self-pay

## 2021-11-13 ENCOUNTER — Ambulatory Visit: Payer: BC Managed Care – PPO | Admitting: Family Medicine

## 2021-11-13 VITALS — BP 104/72 | HR 84 | Ht 64.0 in | Wt 130.0 lb

## 2021-11-13 DIAGNOSIS — M9901 Segmental and somatic dysfunction of cervical region: Secondary | ICD-10-CM

## 2021-11-13 DIAGNOSIS — G5601 Carpal tunnel syndrome, right upper limb: Secondary | ICD-10-CM

## 2021-11-13 DIAGNOSIS — M9908 Segmental and somatic dysfunction of rib cage: Secondary | ICD-10-CM | POA: Diagnosis not present

## 2021-11-13 DIAGNOSIS — M9903 Segmental and somatic dysfunction of lumbar region: Secondary | ICD-10-CM | POA: Diagnosis not present

## 2021-11-13 DIAGNOSIS — M9904 Segmental and somatic dysfunction of sacral region: Secondary | ICD-10-CM

## 2021-11-13 DIAGNOSIS — G8929 Other chronic pain: Secondary | ICD-10-CM

## 2021-11-13 DIAGNOSIS — G5762 Lesion of plantar nerve, left lower limb: Secondary | ICD-10-CM

## 2021-11-13 DIAGNOSIS — M545 Low back pain, unspecified: Secondary | ICD-10-CM

## 2021-11-13 DIAGNOSIS — M25532 Pain in left wrist: Secondary | ICD-10-CM | POA: Diagnosis not present

## 2021-11-13 DIAGNOSIS — M9902 Segmental and somatic dysfunction of thoracic region: Secondary | ICD-10-CM

## 2021-11-13 NOTE — Assessment & Plan Note (Signed)
Chronic, with exacerbation.  Has been sometime since we have seen patient.  Increase activity slowly.  Discussed icing regimen and home exercises, increase activity slowly.  Follow-up again in 6 to 8 weeks.

## 2021-11-13 NOTE — Patient Instructions (Signed)
See me in 6 weeks

## 2021-11-13 NOTE — Assessment & Plan Note (Signed)
Chronic problem with exacerbation.  Discussed icing regimen and home exercises, discussed which activities to do and which ones to avoid: Increase activity slowly.  Discussed icing regimen.  Follow-up with me again in 6 to 8 weeks.

## 2021-11-13 NOTE — Assessment & Plan Note (Signed)
Repeat injection given today.  Chronic problem with exacerbation as well.  Has had this difficulty previously and has responded well to the injection.  Been on 3 months since the injection.  Discussed icing regimen and home exercises otherwise.  Follow-up again in 6 to 8 weeks.

## 2021-11-25 ENCOUNTER — Ambulatory Visit
Admission: RE | Admit: 2021-11-25 | Discharge: 2021-11-25 | Disposition: A | Discharge status: Home / Self Care | Payer: BC Managed Care – PPO | Source: Ambulatory Visit | Attending: Obstetrics and Gynecology | Admitting: Obstetrics and Gynecology

## 2021-11-25 ENCOUNTER — Other Ambulatory Visit: Payer: Self-pay

## 2021-11-25 ENCOUNTER — Other Ambulatory Visit: Payer: Self-pay | Admitting: Obstetrics and Gynecology

## 2021-11-25 DIAGNOSIS — N6320 Unspecified lump in the left breast, unspecified quadrant: Secondary | ICD-10-CM

## 2021-12-24 NOTE — Progress Notes (Signed)
?Amanda [name] D.O. ?[facility] ?[place] ?Phone: [phone] ?Subjective:   ?I, Amanda [name], am serving as a scribe for Dr. Hulan Saas. ? ? ?This visit occurred during the SARS-CoV-2 public health emergency.  Safety protocols were in place, including screening questions prior to the visit, additional usage of staff PPE, and extensive cleaning of exam room while observing appropriate contact time as indicated for disinfecting solutions.  ?I'm seeing this patient by the request  of:  [name], MD ? ?DI:YMEB pain  ? ? ?[id number]  ?11/13/2021 ?Repeat injection given today.  Chronic problem with exacerbation as well.  Has had this difficulty previously and has responded well to the injection.  Been on 3 months since the injection.  Discussed icing regimen and home exercises otherwise.  Follow-up again in 6 to 8 weeks. ? ?Chronic, with exacerbation.  Has been sometime since we have seen patient.  Increase activity slowly.  Discussed icing regimen and home exercises, increase activity slowly.  Follow-up again in 6 to 8 weeks. ? ?Chronic problem with exacerbation.  Discussed icing regimen and home exercises, discussed which activities to do and which ones to avoid: Increase activity slowly.  Discussed icing regimen.  Follow-up with me again in 6 to 8 weeks. ? ?OMT  ? ?Updated 12/25/2021 ?Amanda [name] is a 51 y.o. female coming in with complaint of neck pain. Pain in L side of neck and L trap for past 2 weeks. Slept wrong. Denies any radiating symptoms.  ? ?Does not feel that injection in R wrist is as effective as before. Foot is also  ? ?Refill of Vit D requested ? ? ? ?  ? ?Past Medical History:  ?Diagnosis Date  ? Anxiety   ? Breast mass 04/2012  ? left breast  ? Complication of anesthesia   ? states was hard to wake up post-op  ? Depression   ? Dry cough   ? since vocal cord surgery  ? Sleep related teeth grinding   ? wears mouth guard at night  ? ?Past Surgical  History:  ?Procedure Laterality Date  ? AUGMENTATION MAMMAPLASTY Bilateral 2014  ? BREAST MASS EXCISION Left 05/26/2012  ? CERVIX SURGERY  2005  ? papilloma  ? UMBILICAL HERNIA REPAIR  as a child  ? UHR  ? vocal cord polyp excision  1999  ? ?Social History  ? ?Socioeconomic History  ? Marital status: Married  ?  Spouse name: Not on file  ? Number of children: 1  ? Years of education: Not on file  ? Highest education level: Not on file  ?Occupational History  ? Occupation: Spanish Professor  ?Tobacco Use  ? Smoking status: Every Day  ?  Years: 25.00  ?  Types: Cigarettes  ? Smokeless tobacco: Never  ? Tobacco comments:  ?  smokes 3 cig./day  ?Substance and Sexual Activity  ? Alcohol use: Yes  ?  Alcohol/week: 0.0 standard drinks  ?  Comment: occasional wine  ? Drug use: No  ? Sexual activity: Not on file  ?Other Topics Concern  ? Not on file  ?Social History Narrative  ? Not on file  ? ?Social Determinants of Health  ? ?Financial Resource Strain: Not on file  ?Food Insecurity: Not on file  ?Transportation Needs: Not on file  ?Physical Activity: Not on file  ?Stress: Not on file  ?Social Connections: Not on file  ? ?Allergies  ?Allergen Reactions  ? Chloraprep One Step [Chlorhexidine Gluconate]   ?  Oxycodone Rash  ?  Rash all over   ? Sulfamethoxazole Nausea And Vomiting  ? Vitamin E   ? ?Family History  ?Problem Relation Age of Onset  ? Breast cancer Mother 33  ? Depression Father   ? Brain cancer Maternal Uncle   ? Leukemia Maternal Grandmother   ? Diabetes Maternal Grandfather   ? Depression Paternal Grandfather   ? ADD / ADHD Son   ? Depression Nephew   ? ? ?Current Outpatient Medications (Endocrine & Metabolic):  ?  predniSONE (DELTASONE) 20 MG tablet, Take 1 tablet (20 mg total) by mouth daily with breakfast. ?  predniSONE (DELTASONE) 20 MG tablet, Take 1 tablet (20 mg total) by mouth daily with breakfast. ? ? ? ? ? ?Current Outpatient Medications (Other):  ?  amphetamine-dextroamphetamine (ADDERALL XR) 10 MG 24  hr capsule, Take 1 capsule (10 mg total) by mouth daily. ?  buPROPion (WELLBUTRIN XL) 150 MG 24 hr tablet, Take 150 mg by mouth daily. ?  desvenlafaxine (PRISTIQ) 100 MG 24 hr tablet, 50 mg daily. ?  FLUoxetine (PROZAC) 40 MG capsule, Take 40 mg by mouth at bedtime. ?  hydrOXYzine (ATARAX/VISTARIL) 10 MG tablet, TAKE 1 TABLET BY MOUTH THREE TIMES A DAY AS NEEDED ?  hyoscyamine (LEVBID) 0.375 MG 12 hr tablet, TAKE 1 TABLET BY MOUTH TWICE A DAY ?  methylphenidate 18 MG PO CR tablet, Take 18 mg by mouth every morning. ?  ondansetron (ZOFRAN) 4 MG tablet, Take 1 tablet (4 mg total) by mouth every 6 (six) hours as needed for nausea or vomiting. ?  tiZANidine (ZANAFLEX) 2 MG tablet, TAKE 1 TABLET BY MOUTH EVERYDAY AT BEDTIME ?  UNABLE TO FIND, CBD Gummies at bedtime ?  Vitamin D, Ergocalciferol, (DRISDOL) 1.25 MG (50000 UNIT) CAPS capsule, Take 1 capsule (50,000 Units total) by mouth every 7 (seven) days. ? ? ?Reviewed prior external information including notes and imaging from  ?primary care provider ?As well as notes that were available from care everywhere and other healthcare systems. ? ?Past medical history, social, surgical and family history all reviewed in electronic medical record.  No pertanent information unless stated regarding to the chief complaint.  ? ?Review of Systems: ? No headache, visual changes, nausea, vomiting, diarrhea, constipation, dizziness, abdominal pain, skin rash, fevers, chills, night sweats, weight loss, swollen lymph nodes, body aches, joint swelling, chest pain, shortness of breath, mood changes. POSITIVE muscle aches ? ?Objective  ?Blood pressure 110/78, pulse 89, height '5\' 4"'$  (1.626 m), weight 130 lb (59 kg). ?  ?General: No apparent distress alert and oriented x3 mood and affect normal, dressed appropriately.  ?HEENT: Pupils equal, extraocular movements intact  ?Respiratory: Patient's speak in full sentences and does not appear short of breath  ?Cardiovascular: No lower extremity  edema, non tender, no erythema  ?Gait normal with good balance and coordination.  ?MSK: Neck exam does have some loss of lordosis.  Some tenderness to palpation in the paraspinal musculature.  Patient does have tightness with the neck bilaterally.  Negative Spurling's.  Still has positive Tinel's of the right wrist and moderately of the left wrist ? ?Osteopathic findings ?C2 flexed rotated and side bent right ?C4 flexed rotated and side bent left ?C6 flexed rotated and side bent left ?T3 extended rotated and side bent right inhaled third rib ?T9 extended rotated and side bent left ?L2 flexed rotated and side bent right ?Sacrum right on right ? ? ?  ?Impression and Recommendations:  ?  ? ?The  above documentation has been reviewed and is accurate and complete [name], DO ? ? ?

## 2021-12-25 ENCOUNTER — Ambulatory Visit: Payer: BC Managed Care – PPO | Admitting: Family Medicine

## 2021-12-25 VITALS — BP 110/78 | HR 89 | Ht 64.0 in | Wt 130.0 lb

## 2021-12-25 DIAGNOSIS — M62838 Other muscle spasm: Secondary | ICD-10-CM | POA: Diagnosis not present

## 2021-12-25 DIAGNOSIS — M9908 Segmental and somatic dysfunction of rib cage: Secondary | ICD-10-CM | POA: Diagnosis not present

## 2021-12-25 DIAGNOSIS — M9903 Segmental and somatic dysfunction of lumbar region: Secondary | ICD-10-CM

## 2021-12-25 DIAGNOSIS — M9904 Segmental and somatic dysfunction of sacral region: Secondary | ICD-10-CM

## 2021-12-25 DIAGNOSIS — M9902 Segmental and somatic dysfunction of thoracic region: Secondary | ICD-10-CM

## 2021-12-25 DIAGNOSIS — M9901 Segmental and somatic dysfunction of cervical region: Secondary | ICD-10-CM

## 2021-12-25 DIAGNOSIS — G8929 Other chronic pain: Secondary | ICD-10-CM

## 2021-12-25 DIAGNOSIS — M545 Low back pain, unspecified: Secondary | ICD-10-CM

## 2021-12-25 MED ORDER — VITAMIN D (ERGOCALCIFEROL) 1.25 MG (50000 UNIT) PO CAPS: 50000.0000 [IU] | ORAL_CAPSULE | ORAL | 0 refills | Status: DC

## 2021-12-25 NOTE — Assessment & Plan Note (Signed)
Chronic problem at this time.  Discussed home exercises and icing regimen.  Patient [place] and likely will need injections for the carpal tunnel as well as for the potential for the Morton's neuroma again.  Discussed icing regimen and home exercises, discussed avoiding certain activities.  Follow-up again in 6 to 8 weeks otherwise. ?

## 2021-12-25 NOTE — Assessment & Plan Note (Signed)
Chronic problem with exacerbation.  Likely secondary to positioning with sleeping potentially.  Discussed with patient about icing regimen and home exercises, discussed which activities to do which wants to avoid, increase activity slowly.  Follow-up again in 6 to 8 weeks ?

## 2021-12-25 NOTE — Patient Instructions (Signed)
See me before [place] ?

## 2022-02-13 NOTE — Progress Notes (Deleted)
[facility] [address] [place] Phone: [phone] Subjective:    I'm seeing this patient by the request  of:  [name], MD  CC:   [id number]  Amanda [name] is a 51 y.o. female coming in with complaint of back and neck pain. OMT on 3/30/203. Patient states   Medications patient has been prescribed: Vit D  Taking:         Reviewed prior external information including notes and imaging from previsou exam, outside providers and external EMR if available.   As well as notes that were available from care everywhere and other healthcare systems.  Past medical history, social, surgical and family history all reviewed in electronic medical record.  No pertanent information unless stated regarding to the chief complaint.   Past Medical History:  Diagnosis Date   Anxiety    Breast mass 04/2012   left breast   Complication of anesthesia    states was hard to wake up post-op   Depression    Dry cough    since vocal cord surgery   Sleep related teeth grinding    wears mouth guard at night    Allergies  Allergen Reactions   Chloraprep One Step [Chlorhexidine Gluconate]    Oxycodone Rash    Rash all over    Sulfamethoxazole Nausea And Vomiting   Vitamin E      Review of Systems:  No headache, visual changes, nausea, vomiting, diarrhea, constipation, dizziness, abdominal pain, skin rash, fevers, chills, night sweats, weight loss, swollen lymph nodes, body aches, joint swelling, chest pain, shortness of breath, mood changes. POSITIVE muscle aches  Objective  There were no vitals taken for this visit.   General: No apparent distress alert and oriented x3 mood and affect normal, dressed appropriately.  HEENT: Pupils equal, extraocular movements intact  Respiratory: Patient's speak in full sentences and does not appear short of breath  Cardiovascular: No lower extremity edema, non tender, no erythema  Neuro: Cranial  nerves II through XII are intact, neurovascularly intact in all extremities with 2+ DTRs and 2+ pulses.  Gait normal with good balance and coordination.  MSK:  Non tender with full range of motion and good stability and symmetric strength and tone of shoulders, elbows, wrist, hip, knee and ankles bilaterally.  Back - Normal skin, Spine with normal alignment and no deformity.  No tenderness to vertebral process palpation.  Paraspinous muscles are not tender and without spasm.   Range of motion is full at neck and lumbar sacral regions  Osteopathic findings  C2 flexed rotated and side bent right C6 flexed rotated and side bent left T3 extended rotated and side bent right inhaled rib T9 extended rotated and side bent left L2 flexed rotated and side bent right Sacrum right on right       Assessment and Plan:    Nonallopathic problems  Decision today to treat with OMT was based on Physical Exam  After verbal consent patient was treated with HVLA, ME, FPR techniques in cervical, rib, thoracic, lumbar, and sacral  areas  Patient tolerated the procedure well with improvement in symptoms  Patient given exercises, stretches and lifestyle modifications  See medications in patient instructions if given  Patient will follow up in 4-8 weeks      The above documentation has been reviewed and is accurate and complete [name]       Note: This dictation was prepared with Sales executive along  with smaller phrase technology. Any transcriptional errors that result from this process are unintentional.

## 2022-02-16 ENCOUNTER — Ambulatory Visit: Payer: BC Managed Care – PPO | Admitting: Family Medicine

## 2022-02-19 ENCOUNTER — Ambulatory Visit: Payer: Self-pay

## 2022-02-19 ENCOUNTER — Ambulatory Visit: Payer: BC Managed Care – PPO | Admitting: Family Medicine

## 2022-02-19 ENCOUNTER — Encounter: Payer: Self-pay | Admitting: Family Medicine

## 2022-02-19 VITALS — BP 124/76 | HR 85 | Ht 64.0 in | Wt 133.0 lb

## 2022-02-19 DIAGNOSIS — G5601 Carpal tunnel syndrome, right upper limb: Secondary | ICD-10-CM

## 2022-02-19 DIAGNOSIS — M25532 Pain in left wrist: Secondary | ICD-10-CM

## 2022-02-19 DIAGNOSIS — M9908 Segmental and somatic dysfunction of rib cage: Secondary | ICD-10-CM | POA: Diagnosis not present

## 2022-02-19 DIAGNOSIS — G5602 Carpal tunnel syndrome, left upper limb: Secondary | ICD-10-CM

## 2022-02-19 DIAGNOSIS — M545 Low back pain, unspecified: Secondary | ICD-10-CM | POA: Diagnosis not present

## 2022-02-19 DIAGNOSIS — M9904 Segmental and somatic dysfunction of sacral region: Secondary | ICD-10-CM

## 2022-02-19 DIAGNOSIS — M25531 Pain in right wrist: Secondary | ICD-10-CM

## 2022-02-19 DIAGNOSIS — M9901 Segmental and somatic dysfunction of cervical region: Secondary | ICD-10-CM | POA: Diagnosis not present

## 2022-02-19 DIAGNOSIS — G5781 Other specified mononeuropathies of right lower limb: Secondary | ICD-10-CM

## 2022-02-19 DIAGNOSIS — G5603 Carpal tunnel syndrome, bilateral upper limbs: Secondary | ICD-10-CM | POA: Diagnosis not present

## 2022-02-19 DIAGNOSIS — G5762 Lesion of plantar nerve, left lower limb: Secondary | ICD-10-CM | POA: Diagnosis not present

## 2022-02-19 DIAGNOSIS — M9903 Segmental and somatic dysfunction of lumbar region: Secondary | ICD-10-CM | POA: Diagnosis not present

## 2022-02-19 DIAGNOSIS — G8929 Other chronic pain: Secondary | ICD-10-CM

## 2022-02-19 DIAGNOSIS — M9902 Segmental and somatic dysfunction of thoracic region: Secondary | ICD-10-CM

## 2022-02-19 MED ORDER — VITAMIN D (ERGOCALCIFEROL) 1.25 MG (50000 UNIT) PO CAPS: 50000.0000 [IU] | ORAL_CAPSULE | ORAL | 0 refills | Status: DC

## 2022-02-19 NOTE — Assessment & Plan Note (Signed)
Repeat injection if needed and tolerated procedure well, discussed icing regimen and home exercises, which activities to do and which ones to avoid.  Increase activity slowly.  We will follow-up again in 6 to 8 weeks

## 2022-02-19 NOTE — Progress Notes (Signed)
[name] Phone: [phone] Subjective:   [name], am serving as a scribe for Dr. Hulan Saas.   I'm seeing this patient by the request  of:  Amanda Stains, MD  CC: Back and neck pain,  [id number]  Amanda [name] is a 51 y.o. female coming in with complaint of back and neck pain. OMT 12/25/2021. Patient states that she needs Vit D refill for the summer. Is here today for B carpal tunnel injections as well as L foot, neuroma injection.    Medications patient has been prescribed: Vit D  Taking:         Reviewed prior external information including notes and imaging from previsou exam, outside providers and external EMR if available.   As well as notes that were available from care everywhere and other healthcare systems.  Past medical history, social, surgical and family history all reviewed in electronic medical record.  No pertanent information unless stated regarding to the chief complaint.   Past Medical History:  Diagnosis Date   Anxiety    Breast mass 04/2012   left breast   Complication of anesthesia    states was hard to wake up post-op   Depression    Dry cough    since vocal cord surgery   Sleep related teeth grinding    wears mouth guard at night    Allergies  Allergen Reactions   Chloraprep One Step [Chlorhexidine Gluconate]    Oxycodone Rash    Rash all over    Sulfamethoxazole Nausea And Vomiting   Vitamin E      Review of Systems:  No headache, visual changes, nausea, vomiting, diarrhea, constipation, dizziness, abdominal pain, skin rash, fevers, chills, night sweats, weight loss, swollen lymph nodes, body aches, joint swelling, chest pain, shortness of breath, mood changes. POSITIVE muscle aches  Objective  Blood pressure 124/76, pulse 85, height '5\' 4"'$  (1.626 m), weight 133 lb (60.3 kg), SpO2 98 %.   General: No apparent distress alert and oriented x3 mood and  affect normal, dressed appropriately.  HEENT: Pupils equal, extraocular movements intact  Respiratory: Patient's speak in full sentences and does not appear short of breath  Cardiovascular: No lower extremity edema, non tender, no erythema  Lumbar spondylosis.  Some tenderness to palpation in paraspinal musculature.  Patient does have tightness noted in the neck as well.  Periscapular tenderness noted bilaterally. Hand exam bilaterally does have positive Tinel's noted.  No significant thenar wasting noted yet though.  Patient does have symmetric grip strength.  Procedure: Real-time Ultrasound Guided Injection of right carpal tunnel Device: GE Logiq Q7  Ultrasound guided injection is preferred based studies that show increased duration, increased effect, greater accuracy, decreased procedural pain, increased response rate with ultrasound guided versus blind injection.  Verbal informed consent obtained.  Time-out conducted.  Noted no overlying erythema, induration, or other signs of local infection.  Skin prepped in a sterile fashion.  Local anesthesia: Topical Ethyl chloride.  With sterile technique and under real time ultrasound guidance:  median nerve visualized.  23g 5/8 inch needle inserted distal to proximal approach into nerve sheath. Pictures taken nfor needle placement. Patient did have injection of  0.5 cc of 0.5% Marcaine, and 0.5 cc of Kenalog 40 mg/dL. Completed without difficulty  Advised to call if fevers/chills, erythema, induration, drainage, or persistent bleeding.  Impression: Technically successful ultrasound guided injection.  Procedure: Real-time Ultrasound Guided Injection of right carpal  tunnel Device: GE Logiq Q7 Ultrasound guided injection is preferred based studies that show increased duration, increased effect, greater accuracy, decreased procedural pain, increased response rate with ultrasound guided versus blind injection.  Verbal informed consent obtained.   Time-out conducted.  Noted no overlying erythema, induration, or other signs of local infection.  Skin prepped in a sterile fashion.  Local anesthesia: Topical Ethyl chloride.  With sterile technique and under real time ultrasound guidance:  median nerve visualized.  23g 5/8 inch needle inserted distal to proximal approach into nerve sheath. Pictures taken nfor needle placement. Patient did have injection of 0.5 cc of 0.5% Marcaine, and 0.5 cc of Kenalog 40 mg/dL. Completed without difficulty  Pain immediately resolved suggesting accurate placement of the medication.  Advised to call if fevers/chills, erythema, induration, drainage, or persistent bleeding.  Impression: Technically successful ultrasound guided injection.  Procedure: Real-time Ultrasound Guided Injection of right foot neuroma Device: GE Logiq Q7 Ultrasound guided injection is preferred based studies that show increased duration, increased effect, greater accuracy, decreased procedural pain, increased response rate, and decreased cost with ultrasound guided versus blind injection.  Verbal informed consent obtained.  Time-out conducted.  Noted no overlying erythema, induration, or other signs of local infection.  Skin prepped in a sterile fashion.  Local anesthesia: Topical Ethyl chloride.  With sterile technique and under real time ultrasound guidance: With a 25-gauge half inch needle injecting 0.5 cc of 0.5% Marcaine and 0.5 cc of Kenalog 40 mg/mL Completed without difficulty  Pain immediately resolved suggesting accurate placement of the medication.  Advised to call if fevers/chills, erythema, induration, drainage, or persistent bleeding.  Impression: Technically successful ultrasound guided injection.  Procedure: Real-time Ultrasound Guided Injection of left foot neuroma Device: GE Logiq Q7 Ultrasound guided injection is preferred based studies that show increased duration, increased effect, greater accuracy, decreased  procedural pain, increased response rate, and decreased cost with ultrasound guided versus blind injection.  Verbal informed consent obtained.  Time-out conducted.  Noted no overlying erythema, induration, or other signs of local infection.  Skin prepped in a sterile fashion.  Local anesthesia: Topical Ethyl chloride.  With sterile technique and under real time ultrasound guidance: 25-gauge-needle  injected with 0.5 cc of 0.5% Marcaine and 0.5 cc of Kenalog 40 mg/mL Completed without difficulty  Pain immediately resolved suggesting accurate placement of the medication.  Advised to call if fevers/chills, erythema, induration, drainage, or persistent bleeding.  Impression: Technically successful ultrasound guided injection.  Osteopathic findings  C2 flexed rotated and side bent right C5 flexed rotated and side bent left T3 extended rotated and side bent right inhaled rib T9 extended rotated and side bent left L2 flexed rotated and side bent right Sacrum right on right       Assessment and Plan:  Low back pain Chronic problem with exacerbation.  Discussed with patient about icing regimen and home exercises.  Patient has hydroxyzine that she thinks does help from time to time.  Discussed posturing or numbness otherwise.  Patient will be traveling for the next 2 months.  We will see her again in 2 months otherwise.  Right carpal tunnel syndrome Repeat injection if needed and tolerated procedure well, discussed icing regimen and home exercises, which activities to do and which ones to avoid.  Increase activity slowly.  We will follow-up again in 6 to 8 weeks  Left carpal tunnel syndrome Repeat injection given.  New chronic problem with exacerbation. Have discussed different medications including gabapentin but finds that the Zanaflex is  more helpful.  Refill given for her to try.  We will follow-up again in 2 months  Morton's neuroma of left foot Repeat injection given today,  tolerated the procedure well, if continuing to have pain Consider the possibility of changing Prozac to Cymbalta.  Interdigital neuroma of right foot Injection given today and tolerated the procedure well, discussed icing regimen and home exercises, which activities video which wants to avoid.  Increase activity slowly.  Follow-up again in  8 weeks we discussed proper shoes   Nonallopathic problems  Decision today to treat with OMT was based on Physical Exam  After verbal consent patient was treated with HVLA, ME, FPR techniques in cervical, rib, thoracic, lumbar, and sacral  areas  Patient tolerated the procedure well with improvement in symptoms  Patient given exercises, stretches and lifestyle modifications  See medications in patient instructions if given  Patient will follow up in 4-8 weeks      The above documentation has been reviewed and is accurate and complete [name], DO       Note: This dictation was prepared with Dragon dictation along with smaller phrase technology. Any transcriptional errors that result from this process are unintentional.

## 2022-02-19 NOTE — Assessment & Plan Note (Signed)
Injection given today and tolerated the procedure well, discussed icing regimen and home exercises, which activities video which wants to avoid.  Increase activity slowly.  Follow-up again in  8 weeks we discussed proper shoes

## 2022-02-19 NOTE — Assessment & Plan Note (Signed)
Chronic problem with exacerbation.  Discussed with patient about icing regimen and home exercises.  Patient has hydroxyzine that she thinks does help from time to time.  Discussed posturing or numbness otherwise.  Patient will be traveling for the next 2 months.  We will see her again in 2 months otherwise.

## 2022-02-19 NOTE — Assessment & Plan Note (Signed)
Repeat injection given today, tolerated the procedure well, if continuing to have pain Consider the possibility of changing Prozac to Cymbalta.

## 2022-02-19 NOTE — Assessment & Plan Note (Signed)
Repeat injection given.  New chronic problem with exacerbation. Have discussed different medications including gabapentin but finds that the Zanaflex is more helpful.  Refill given for her to try.  We will follow-up again in 2 months

## 2022-02-19 NOTE — Patient Instructions (Signed)
Symmetrical injections today Enjoy [place]  See you in August

## 2022-05-25 NOTE — Progress Notes (Unsigned)
[place] Phone: [phone] Subjective:   [name], am serving as a scribe for Dr. Hulan Saas.  I'm seeing this patient by the request  of:  [name], MD  CC:   [id number]  02/19/2022 Injection given today and tolerated the procedure well, discussed icing regimen and home exercises, which activities video which wants to avoid.  Increase activity slowly.  Follow-up again in  8 weeks we discussed proper shoes  Repeat injection given today, tolerated the procedure well, if continuing to have pain Consider the possibility of changing Prozac to Cymbalta.  Repeat injection given.  New chronic problem with exacerbation. Have discussed different medications including gabapentin but finds that the Zanaflex is more helpful.  Refill given for her to try.  We will follow-up again in 2 months  Repeat injection if needed and tolerated procedure well, discussed icing regimen and home exercises, which activities to do and which ones to avoid.  Increase activity slowly.  We will follow-up again in 6 to 8 weeks  Chronic problem with exacerbation.  Discussed with patient about icing regimen and home exercises.  Patient has hydroxyzine that she thinks does help from time to time.  Discussed posturing or numbness otherwise.  Patient will be traveling for the next 2 months.  We will see her again in 2 months otherwise.  Updated 05/26/2022 Amanda [name] is a 51 y.o. female coming in with complaint of foot and wrist pain. Patient did some kickboxing over the summer without. Patient said that her back pain was manageable over the summer. Wrist pain is worsening since returning to work but wants to hold off on injections at this time. Here for manipulation today.        Past Medical History:  Diagnosis Date   Anxiety    Breast mass 04/2012   left breast   Complication of anesthesia    states was hard to wake up  post-op   Depression    Dry cough    since vocal cord surgery   Sleep related teeth grinding    wears mouth guard at night   Past Surgical History:  Procedure Laterality Date   AUGMENTATION MAMMAPLASTY Bilateral 2014   BREAST MASS EXCISION Left 05/26/2012   CERVIX SURGERY  2005   papilloma   UMBILICAL HERNIA REPAIR  as a child   UHR   vocal cord polyp excision  1999   Social History   Socioeconomic History   Marital status: Married    Spouse name: Not on file   Number of children: 1   Years of education: Not on file   Highest education level: Not on file  Occupational History   Occupation: Spanish Professor  Tobacco Use   Smoking status: Every Day    Years: 25.00    Types: Cigarettes   Smokeless tobacco: Never   Tobacco comments:    smokes 3 cig./day  Substance and Sexual Activity   Alcohol use: Yes    Alcohol/week: 0.0 standard drinks of alcohol    Comment: occasional wine   Drug use: No   Sexual activity: Not on file  Other Topics Concern   Not on file  Social History Narrative   Not on file   Social Determinants of Health   Financial Resource Strain: Not on file  Food Insecurity: Not on file  Transportation Needs: Not on file  Physical Activity: Not on file  Stress: Not on file  Social Connections: Not on file   Allergies  Allergen Reactions   Chloraprep One Step [Chlorhexidine Gluconate]    Oxycodone Rash    Rash all over    Sulfamethoxazole Nausea And Vomiting   Vitamin E    Family History  Problem Relation Age of Onset   Breast cancer Mother 82   Depression Father    Brain cancer Maternal Uncle    Leukemia Maternal Grandmother    Diabetes Maternal Grandfather    Depression Paternal Grandfather    ADD / ADHD Son    Depression Nephew     Current Outpatient Medications (Endocrine & Metabolic):    predniSONE (DELTASONE) 20 MG tablet, Take 1 tablet (20 mg total) by mouth daily with breakfast.   predniSONE (DELTASONE) 20 MG tablet, Take 1  tablet (20 mg total) by mouth daily with breakfast.      Current Outpatient Medications (Other):    amphetamine-dextroamphetamine (ADDERALL XR) 10 MG 24 hr capsule, Take 1 capsule (10 mg total) by mouth daily.   buPROPion (WELLBUTRIN XL) 150 MG 24 hr tablet, Take 150 mg by mouth daily.   desvenlafaxine (PRISTIQ) 100 MG 24 hr tablet, 50 mg daily.   FLUoxetine (PROZAC) 40 MG capsule, Take 40 mg by mouth at bedtime.   hydrOXYzine (ATARAX/VISTARIL) 10 MG tablet, TAKE 1 TABLET BY MOUTH THREE TIMES A DAY AS NEEDED   hyoscyamine (LEVBID) 0.375 MG 12 hr tablet, TAKE 1 TABLET BY MOUTH TWICE A DAY   methylphenidate 18 MG PO CR tablet, Take 18 mg by mouth every morning.   ondansetron (ZOFRAN) 4 MG tablet, Take 1 tablet (4 mg total) by mouth every 6 (six) hours as needed for nausea or vomiting.   tiZANidine (ZANAFLEX) 2 MG tablet, TAKE 1 TABLET BY MOUTH EVERYDAY AT BEDTIME   UNABLE TO FIND, CBD Gummies at bedtime   Vitamin D, Ergocalciferol, (DRISDOL) 1.25 MG (50000 UNIT) CAPS capsule, Take 1 capsule (50,000 Units total) by mouth every 7 (seven) days.   Reviewed prior external information including notes and imaging from  primary care provider As well as notes that were available from care everywhere and other healthcare systems.  Past medical history, social, surgical and family history all reviewed in electronic medical record.  No pertanent information unless stated regarding to the chief complaint.   Review of Systems:  No headache, visual changes, nausea, vomiting, diarrhea, constipation, dizziness, abdominal pain, skin rash, fevers, chills, night sweats, weight loss, swollen lymph nodes, body aches, joint swelling, chest pain, shortness of breath, mood changes. POSITIVE muscle aches  Objective  Blood pressure 122/84, pulse 84, height '5\' 4"'$  (1.626 m), SpO2 98 %.   General: No apparent distress alert and oriented x3 mood and affect normal, dressed appropriately.  HEENT: Pupils equal,  extraocular movements intact  Respiratory: Patient's speak in full sentences and does not appear short of breath  Cardiovascular: No lower extremity edema, non tender, no erythema  Patient does have some loss of lordosis of the lumbar spine.  Some tightness noted especially with FABER test on the right side.  Negative straight leg test noted.  Osteopathic findings C2 flexed rotated and side bent right C4 flexed rotated and side bent left C6 flexed rotated and side bent left T3 extended rotated and side bent left inhaled third rib T9 extended rotated and side bent left inhaled rib L4 flexed rotated and side bent left Sacrum right on right     Impression and Recommendations:    The above documentation has been  reviewed and is accurate and complete Amanda Pulley, DO

## 2022-05-26 ENCOUNTER — Encounter: Payer: Self-pay | Admitting: Family Medicine

## 2022-05-26 ENCOUNTER — Ambulatory Visit: Payer: BC Managed Care – PPO | Admitting: Family Medicine

## 2022-05-26 ENCOUNTER — Ambulatory Visit: Payer: Self-pay

## 2022-05-26 VITALS — BP 122/84 | HR 84 | Ht 64.0 in

## 2022-05-26 DIAGNOSIS — M25531 Pain in right wrist: Secondary | ICD-10-CM

## 2022-05-26 DIAGNOSIS — M9903 Segmental and somatic dysfunction of lumbar region: Secondary | ICD-10-CM

## 2022-05-26 DIAGNOSIS — M545 Low back pain, unspecified: Secondary | ICD-10-CM | POA: Diagnosis not present

## 2022-05-26 DIAGNOSIS — M9904 Segmental and somatic dysfunction of sacral region: Secondary | ICD-10-CM

## 2022-05-26 DIAGNOSIS — G8929 Other chronic pain: Secondary | ICD-10-CM

## 2022-05-26 DIAGNOSIS — M9901 Segmental and somatic dysfunction of cervical region: Secondary | ICD-10-CM | POA: Diagnosis not present

## 2022-05-26 DIAGNOSIS — M9908 Segmental and somatic dysfunction of rib cage: Secondary | ICD-10-CM | POA: Diagnosis not present

## 2022-05-26 DIAGNOSIS — M9902 Segmental and somatic dysfunction of thoracic region: Secondary | ICD-10-CM

## 2022-05-26 DIAGNOSIS — M999 Biomechanical lesion, unspecified: Secondary | ICD-10-CM

## 2022-05-26 DIAGNOSIS — M25532 Pain in left wrist: Secondary | ICD-10-CM

## 2022-05-26 NOTE — Assessment & Plan Note (Signed)
Patient does have more muscular pain than usual.  Has been sometime since we have seen patient.  Has been doing some other activities such as kickboxing that I do think will be better for her in the long run.  Increase activity slowly otherwise.  Follow-up with me again in 6 to 8 weeks.  Has Zanaflex for breakthrough pain for the exacerbations.

## 2022-05-26 NOTE — Assessment & Plan Note (Signed)

## 2022-07-14 ENCOUNTER — Ambulatory Visit: Payer: BC Managed Care – PPO | Admitting: Family Medicine

## 2022-07-16 ENCOUNTER — Ambulatory Visit: Payer: BC Managed Care – PPO | Admitting: Family Medicine

## 2022-07-16 ENCOUNTER — Ambulatory Visit: Payer: Self-pay

## 2022-07-16 ENCOUNTER — Encounter: Payer: Self-pay | Admitting: Family Medicine

## 2022-07-16 VITALS — BP 108/74 | HR 100 | Ht 64.0 in | Wt 138.0 lb

## 2022-07-16 DIAGNOSIS — G8929 Other chronic pain: Secondary | ICD-10-CM

## 2022-07-16 DIAGNOSIS — M9903 Segmental and somatic dysfunction of lumbar region: Secondary | ICD-10-CM

## 2022-07-16 DIAGNOSIS — M25531 Pain in right wrist: Secondary | ICD-10-CM

## 2022-07-16 DIAGNOSIS — M9908 Segmental and somatic dysfunction of rib cage: Secondary | ICD-10-CM | POA: Diagnosis not present

## 2022-07-16 DIAGNOSIS — G5602 Carpal tunnel syndrome, left upper limb: Secondary | ICD-10-CM | POA: Diagnosis not present

## 2022-07-16 DIAGNOSIS — M9902 Segmental and somatic dysfunction of thoracic region: Secondary | ICD-10-CM

## 2022-07-16 DIAGNOSIS — L0390 Cellulitis, unspecified: Secondary | ICD-10-CM | POA: Diagnosis not present

## 2022-07-16 DIAGNOSIS — M545 Low back pain, unspecified: Secondary | ICD-10-CM

## 2022-07-16 DIAGNOSIS — M9904 Segmental and somatic dysfunction of sacral region: Secondary | ICD-10-CM | POA: Diagnosis not present

## 2022-07-16 DIAGNOSIS — M9901 Segmental and somatic dysfunction of cervical region: Secondary | ICD-10-CM

## 2022-07-16 DIAGNOSIS — M25532 Pain in left wrist: Secondary | ICD-10-CM | POA: Diagnosis not present

## 2022-07-16 DIAGNOSIS — F4522 Body dysmorphic disorder: Secondary | ICD-10-CM

## 2022-07-16 NOTE — Patient Instructions (Addendum)
Good to see you! Injection in left wrist today Referral to Dr. Silas Flood butt cream and cellulite oil Lymphedema precox See you again in 8 weeks

## 2022-07-16 NOTE — Assessment & Plan Note (Signed)
Low back does have loss of lordosis  Tightness with FABER test  Patient will continue to do relatively well overall though.  Discussed with patient that we will continue with the osteopathic manipulation.  Does have Zanaflex 2 mg at night if needed.

## 2022-07-16 NOTE — Progress Notes (Signed)
[place] [address] [place] Phone: [phone] Subjective:    I'm seeing this patient by the request  of:  [name], MD  CC: back and neck pain   [id number]  Amanda [name] is a 51 y.o. female coming in with complaint of back and neck pain. OMT on 05/26/2022. F/u on wrist pain as well. Patient states doing well. Would like injection in left wrist.  Other issue is patient feels like she has cellulite and would like to see a Psychiatric nurse.          Reviewed prior external information including notes and imaging from previsou exam, outside providers and external EMR if available.   As well as notes that were available from care everywhere and other healthcare systems.  Past medical history, social, surgical and family history all reviewed in electronic medical record.  No pertanent information unless stated regarding to the chief complaint.   Past Medical History:  Diagnosis Date   Anxiety    Breast mass 04/2012   left breast   Complication of anesthesia    states was hard to wake up post-op   Depression    Dry cough    since vocal cord surgery   Sleep related teeth grinding    wears mouth guard at night    Allergies  Allergen Reactions   Chloraprep One Step [Chlorhexidine Gluconate]    Oxycodone Rash    Rash all over    Sulfamethoxazole Nausea And Vomiting   Vitamin E      Review of Systems:  No headache, visual changes, nausea, vomiting, diarrhea, constipation, dizziness, abdominal pain, skin rash, fevers, chills, night sweats, weight loss, swollen lymph nodes, body aches, joint swelling, chest pain, shortness of breath, mood changes. POSITIVE muscle aches  Objective  Blood pressure 108/74, pulse 100, height '5\' 4"'$  (1.626 m), weight 138 lb (62.6 kg), SpO2 98 %.   General: No apparent distress alert and oriented x3 mood and affect normal, dressed appropriately.  HEENT: Pupils equal, extraocular  movements intact  Respiratory: Patient's speak in full sentences and does not appear short of breath  Cardiovascular: No lower extremity edema, non tender, no erythema  Patient does have some tenderness to palpation in the paraspinal musculature.  Patient does have some loss of lordosis.  Patient's left wrist does have a positive Tinel's sign noted.  Good grip strength noted though.  Procedure: Real-time Ultrasound Guided Injection of left carpal tunnel Device: GE Logiq Q7 Ultrasound guided injection is preferred based studies that show increased duration, increased effect, greater accuracy, decreased procedural pain, increased response rate with ultrasound guided versus blind injection.  Verbal informed consent obtained.  Time-out conducted.  Noted no overlying erythema, induration, or other signs of local infection.  Skin prepped in a sterile fashion.  Local anesthesia: Topical Ethyl chloride.  With sterile technique and under real time ultrasound guidance:  median nerve visualized.  23g 5/8 inch needle inserted distal to proximal approach into nerve sheath. Pictures taken nfor needle placement. Patient did have injection of 0.5 cc of 0.5% Marcaine, and 0.5 cc of Kenalog 40 mg/dL. Completed without difficulty  Pain immediately resolved suggesting accurate placement of the medication.  Advised to call if fevers/chills, erythema, induration, drainage, or persistent bleeding.  Impression: Technically successful ultrasound guided injection.   Osteopathic findings  C3 flexed rotated and side bent right C5 flexed rotated and side bent left T3 extended rotated and side bent right inhaled rib  T9 extended rotated and side bent left L2 flexed rotated and side bent right Sacrum right on right       Assessment and Plan:  Low back pain Low back does have loss of lordosis  Tightness with FABER test  Patient will continue to do relatively well overall though.  Discussed with patient that we  will continue with the osteopathic manipulation.  Does have Zanaflex 2 mg at night if needed.  Left carpal tunnel syndrome Patient given another injection for this chronic worsening symptoms.  Last injection was nearly a year ago though.  Hopefully patient does respond well to this.  Discussed icing regimen and home exercises otherwise.  Increase activity slowly.  Follow-up again in 6 to 8 weeks    Nonallopathic problems  Decision today to treat with OMT was based on Physical Exam  After verbal consent patient was treated with HVLA, ME, FPR techniques in cervical, rib, thoracic, lumbar, and sacral  areas  Patient tolerated the procedure well with improvement in symptoms  Patient given exercises, stretches and lifestyle modifications  See medications in patient instructions if given  Patient will follow up in 4-8 weeks    The above documentation has been reviewed and is accurate and complete [name], DO          Note: This dictation was prepared with Dragon dictation along with smaller phrase technology. Any transcriptional errors that result from this process are unintentional.

## 2022-07-16 NOTE — Assessment & Plan Note (Signed)
Patient states that she has a cellulitis and does not like the smoke and is wondering what is other modalities that could help.  Will refer to plastic surgery to see if a any suggestions.

## 2022-07-16 NOTE — Assessment & Plan Note (Signed)
Patient given another injection for this chronic worsening symptoms.  Last injection was nearly a year ago though.  Hopefully patient does respond well to this.  Discussed icing regimen and home exercises otherwise.  Increase activity slowly.  Follow-up again in 6 to 8 weeks

## 2022-07-17 ENCOUNTER — Ambulatory Visit: Payer: BC Managed Care – PPO | Admitting: Family Medicine

## 2022-08-18 ENCOUNTER — Telehealth: Payer: Self-pay | Admitting: Family Medicine

## 2022-08-18 MED ORDER — VITAMIN D (ERGOCALCIFEROL) 1.25 MG (50000 UNIT) PO CAPS: 50000.0000 [IU] | ORAL_CAPSULE | ORAL | 0 refills | Status: DC

## 2022-08-18 NOTE — Telephone Encounter (Signed)
Refill sent.

## 2022-08-18 NOTE — Telephone Encounter (Signed)
Pt need Vit D refilled to [facility] on file.

## 2022-09-03 NOTE — Progress Notes (Unsigned)
[place] [address] [place] Phone: [phone] Subjective:    I'm seeing this patient by the request  of:  [name], MD  CC: back and neck pain follow up   [id number]  CHANDLAR STAEBELL is a 51 y.o. female coming in with complaint of back and neck pain. OMT 07/16/2022. Also f/u for B wrist pain. Patient states that her low back has been in a lot of pain th pain radiates down her right leg. Also thinks she may need an injection in the right wrist and right elbow. States that the hand and fingers will start to tingle and get numb does get some releif when massaging the arm.    Medications patient has been prescribed: None  Taking:         Reviewed prior external information including notes and imaging from previsou exam, outside providers and external EMR if available.   As well as notes that were available from care everywhere and other healthcare systems. Recent external hemorrhoid surgery 12/4   Past medical history, social, surgical and family history all reviewed in electronic medical record.  No pertanent information unless stated regarding to the chief complaint.   Past Medical History:  Diagnosis Date   Anxiety    Breast mass 04/2012   left breast   Complication of anesthesia    states was hard to wake up post-op   Depression    Dry cough    since vocal cord surgery   Sleep related teeth grinding    wears mouth guard at night    Allergies  Allergen Reactions   Chloraprep One Step [Chlorhexidine Gluconate]    Oxycodone Rash    Rash all over    Sulfamethoxazole Nausea And Vomiting   Vitamin E      Review of Systems:  No headache, visual changes, nausea, vomiting, diarrhea, constipation, dizziness, abdominal pain, skin rash, fevers, chills, night sweats, weight loss, swollen lymph nodes, body aches, joint swelling, chest pain, shortness of breath, mood changes. POSITIVE muscle aches  Objective   There were no vitals taken for this visit.   General: No apparent distress alert and oriented x3 mood and affect normal, dressed appropriately.  HEENT: Pupils equal, extraocular movements intact  Respiratory: Patient's speak in full sentences and does not appear short of breath  Cardiovascular: No lower extremity edema, non tender, no erythema  Gait MSK:  Back   Osteopathic findings  C2 flexed rotated and side bent right C6 flexed rotated and side bent left T3 extended rotated and side bent right inhaled rib T9 extended rotated and side bent left L2 flexed rotated and side bent right Sacrum right on right       Assessment and Plan:  No problem-specific Assessment & Plan notes found for this encounter.    Nonallopathic problems  Decision today to treat with OMT was based on Physical Exam  After verbal consent patient was treated with HVLA, ME, FPR techniques in cervical, rib, thoracic, lumbar, and sacral  areas  Patient tolerated the procedure well with improvement in symptoms  Patient given exercises, stretches and lifestyle modifications  See medications in patient instructions if given  Patient will follow up in 4-8 weeks    The above documentation has been reviewed and is accurate and complete [name], DO          Note: This dictation was prepared with Dragon dictation along with smaller phrase technology. Any transcriptional errors  that result from this process are unintentional.

## 2022-09-08 ENCOUNTER — Ambulatory Visit: Payer: BC Managed Care – PPO | Admitting: Family Medicine

## 2022-09-08 ENCOUNTER — Ambulatory Visit: Payer: Self-pay

## 2022-09-08 VITALS — BP 100/68 | HR 87 | Ht 64.0 in | Wt 134.0 lb

## 2022-09-08 DIAGNOSIS — G5601 Carpal tunnel syndrome, right upper limb: Secondary | ICD-10-CM | POA: Diagnosis not present

## 2022-09-08 DIAGNOSIS — M25531 Pain in right wrist: Secondary | ICD-10-CM | POA: Diagnosis not present

## 2022-09-08 DIAGNOSIS — M9904 Segmental and somatic dysfunction of sacral region: Secondary | ICD-10-CM

## 2022-09-08 DIAGNOSIS — M9908 Segmental and somatic dysfunction of rib cage: Secondary | ICD-10-CM | POA: Diagnosis not present

## 2022-09-08 DIAGNOSIS — M9903 Segmental and somatic dysfunction of lumbar region: Secondary | ICD-10-CM | POA: Diagnosis not present

## 2022-09-08 DIAGNOSIS — M25532 Pain in left wrist: Secondary | ICD-10-CM

## 2022-09-08 DIAGNOSIS — M62838 Other muscle spasm: Secondary | ICD-10-CM

## 2022-09-08 DIAGNOSIS — M9902 Segmental and somatic dysfunction of thoracic region: Secondary | ICD-10-CM

## 2022-09-08 DIAGNOSIS — M9901 Segmental and somatic dysfunction of cervical region: Secondary | ICD-10-CM

## 2022-09-08 NOTE — Patient Instructions (Addendum)
Good to see you Injection in wrist today  DHEA '50mg'$  daily for 4 weeks then 2 weeks off Dr. Christie Nottingham for dentist Otherwise we can find you an oral maxillofacial doctor  You should do well with this injection See me again in 6-8 weeks

## 2022-09-09 NOTE — Assessment & Plan Note (Addendum)
Tightness noted, limited side bending bilaterally  Positive FABER  Tightness with SLT \ RTC in 6 weeks

## 2022-09-09 NOTE — Assessment & Plan Note (Signed)
Chronic, with worsening symptoms.  Greater than 6 months since injection.  Still holding off on surgery but I do think that this is going to be necessary in the relative future.  Follow-up with me again in 6 to 8 weeks

## 2022-09-30 ENCOUNTER — Other Ambulatory Visit: Payer: Self-pay | Admitting: Family Medicine

## 2022-10-15 NOTE — Progress Notes (Signed)
[place] Phone: [phone] Subjective:   Amanda [name], am serving as a scribe for Dr. Hulan Saas.  I'm seeing this patient by the request  of:  [name], MD  CC: back and neck pain   [id number]  Amanda [name] is a 52 y.o. female coming in with complaint of back and neck pain. OMT 09/08/2022. Also f/u for B carpal tunnel. Patient states that she was sick in December and rested a lot. [name] pain.   Medications patient has been prescribed: Vit D  Taking:         Reviewed prior external information including notes and imaging from previsou exam, outside providers and external EMR if available.   As well as notes that were available from care everywhere and other healthcare systems.  Past medical history, social, surgical and family history all reviewed in electronic medical record.  [name] pertanent information unless stated regarding to the chief complaint.   Past Medical History:  Diagnosis Date   Anxiety    Breast mass 04/2012   left breast   Complication of anesthesia    states was hard to wake up post-op   Depression    Dry cough    since vocal cord surgery   Sleep related teeth grinding    wears mouth guard at night    Allergies  Allergen Reactions   Chloraprep One Step [Chlorhexidine Gluconate]    Oxycodone Rash    Rash all over    Lamotrigine    Sulfamethoxazole Nausea And Vomiting   Vitamin E      Review of Systems:  [name] headache, visual changes, nausea, vomiting, diarrhea, constipation, dizziness, abdominal pain, skin rash, fevers, chills, night sweats, weight loss, swollen lymph nodes, body aches, joint swelling, chest pain, shortness of breath, mood changes. POSITIVE muscle aches  Objective  Blood pressure 102/78, pulse (!) 102, height '5\' 4"'$  (1.626 m), weight 138 lb (62.6 kg), SpO2 99 %.   General: [name] apparent distress alert and oriented x3 mood and affect normal,  dressed appropriately.  HEENT: Pupils equal, extraocular movements intact  Respiratory: Patient's speak in full sentences and does not appear short of breath  Cardiovascular: [name] lower extremity edema, non tender, [name] erythema  MSK:  Back does have loss of lordosis.  Tightness with FABER test noted.  Tightness in the thoracolumbar juncture somewhat.  Neck exam does have some limited sidebending bilaterally.  Osteopathic findings  C2 flexed rotated and side bent right C6 flexed rotated and side bent left T3 extended rotated and side bent right inhaled rib T8 extended rotated and side bent left L2 flexed rotated and side bent right Sacrum right on right       Assessment and Plan:  Neck muscle spasm Neck exam does have some loss of lordosis.  Does have some tightness noted in the lower back.  Patient still seems to be more multifactorial.  Discussed which activities to do and which ones to avoid.  Discussed which activities to do, home exercises, increase activity slowly.  Follow-up again in 6 to 8 weeks  Low back pain Loss of lordosis noted.  Does have some tightness with Corky Sox right greater than left.  Does respond well though to the osteopathic manipulation.  Refilled meloxicam secondary to the worsening pain.  Follow-up again in 6 to 8 weeks    Nonallopathic problems  Decision today to treat with OMT was based on Physical Exam  After  verbal consent patient was treated with HVLA, ME, FPR techniques in cervical, rib, thoracic, lumbar, and sacral  areas  Patient tolerated the procedure well with improvement in symptoms  Patient given exercises, stretches and lifestyle modifications  See medications in patient instructions if given  Patient will follow up in 4-8 weeks    The above documentation has been reviewed and is accurate and complete [name], DO          Note: This dictation was prepared with Dragon dictation along with smaller phrase technology. Any  transcriptional errors that result from this process are unintentional.

## 2022-10-20 ENCOUNTER — Ambulatory Visit: Payer: BC Managed Care – PPO | Admitting: Family Medicine

## 2022-10-20 VITALS — BP 102/78 | HR 102 | Ht 64.0 in | Wt 138.0 lb

## 2022-10-20 DIAGNOSIS — M9903 Segmental and somatic dysfunction of lumbar region: Secondary | ICD-10-CM | POA: Diagnosis not present

## 2022-10-20 DIAGNOSIS — M9902 Segmental and somatic dysfunction of thoracic region: Secondary | ICD-10-CM

## 2022-10-20 DIAGNOSIS — M62838 Other muscle spasm: Secondary | ICD-10-CM

## 2022-10-20 DIAGNOSIS — M9908 Segmental and somatic dysfunction of rib cage: Secondary | ICD-10-CM | POA: Diagnosis not present

## 2022-10-20 DIAGNOSIS — G8929 Other chronic pain: Secondary | ICD-10-CM | POA: Diagnosis not present

## 2022-10-20 DIAGNOSIS — M545 Low back pain, unspecified: Secondary | ICD-10-CM | POA: Diagnosis not present

## 2022-10-20 DIAGNOSIS — M9904 Segmental and somatic dysfunction of sacral region: Secondary | ICD-10-CM | POA: Diagnosis not present

## 2022-10-20 DIAGNOSIS — M9901 Segmental and somatic dysfunction of cervical region: Secondary | ICD-10-CM

## 2022-10-20 NOTE — Assessment & Plan Note (Signed)
Loss of lordosis noted.  Does have some tightness with Corky Sox right greater than left.  Does respond well though to the osteopathic manipulation.  Refilled meloxicam secondary to the worsening pain.  Follow-up again in 6 to 8 weeks

## 2022-10-20 NOTE — Patient Instructions (Addendum)
Good to see you See me in 6-8 weeks

## 2022-10-20 NOTE — Assessment & Plan Note (Signed)
Neck exam does have some loss of lordosis.  Does have some tightness noted in the lower back.  Patient still seems to be more multifactorial.  Discussed which activities to do and which ones to avoid.  Discussed which activities to do, home exercises, increase activity slowly.  Follow-up again in 6 to 8 weeks

## 2022-11-03 ENCOUNTER — Other Ambulatory Visit: Payer: Self-pay | Admitting: Obstetrics and Gynecology

## 2022-11-03 DIAGNOSIS — N6320 Unspecified lump in the left breast, unspecified quadrant: Secondary | ICD-10-CM

## 2022-11-26 NOTE — Progress Notes (Signed)
[facility] [address] [place] Phone: [phone] Subjective:    I'm seeing this patient by the request  of:  [name], MD  CC: back and neck pain follow up   [id number]  Amanda [name] is a 52 y.o. female coming in with complaint of back and neck pain. OMT on 10/20/2022. Patient states that her back and neck pain have increased. Likes to kick box and she does not bend her knees and has been putting more strain on lower back. Also having tingling in hands in the morning. Also having radiating symptoms in both legs R>L.   Medications patient has been prescribed: Vit D  Taking:         Reviewed prior external information including notes and imaging from previsou exam, outside providers and external EMR if available.   As well as notes that were available from care everywhere and other healthcare systems.  Past medical history, social, surgical and family history all reviewed in electronic medical record.  No pertanent information unless stated regarding to the chief complaint.   Past Medical History:  Diagnosis Date   Anxiety    Breast mass 04/2012   left breast   Complication of anesthesia    states was hard to wake up post-op   Depression    Dry cough    since vocal cord surgery   Sleep related teeth grinding    wears mouth guard at night    Allergies  Allergen Reactions   Chloraprep One Step [Chlorhexidine Gluconate]    Oxycodone Rash    Rash all over    Lamotrigine    Sulfamethoxazole Nausea And Vomiting   Vitamin E      Review of Systems:  No headache, visual changes, nausea, vomiting, diarrhea, constipation, dizziness, abdominal pain, skin rash, fevers, chills, night sweats, weight loss, swollen lymph nodes, body aches, joint swelling, chest pain, shortness of breath, mood changes. POSITIVE muscle aches  Objective  Blood pressure 118/84, height '5\' 4"'$  (1.626 m), weight 134 lb (60.8 kg).   General:  No apparent distress alert and oriented x3 mood and affect normal, dressed appropriately.  HEENT: Pupils equal, extraocular movements intact  Respiratory: Patient's speak in full sentences and does not appear short of breath  Cardiovascular: No lower extremity edema, non tender, no erythema  Low back exam does have some loss of lordosis.  Worsening pain with extension.  Patient does have tightness with FABER test right greater than left and tenderness over the sacroiliac joint.  Osteopathic findings  C2 flexed rotated and side bent right C5 flexed rotated and side bent left T3 extended rotated and side bent right inhaled rib T9 extended rotated and side bent left L2 flexed rotated and side bent right Sacrum right on right     Assessment and Plan:  Low back pain Continues to have significant low back pain with some intermittent radicular symptoms that seems to be on the right leg.  Sometimes causes spasms that stops her from activity.  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  We discussed with patient about different stretches that would be good after activities patient is having.  Follow-up again in 6 to 8 weeks we will get labs today as well make sure the cramping is nothing else that is contributing.    Nonallopathic problems  Decision today to treat with OMT was based on Physical Exam  After verbal consent patient was treated with HVLA, ME, FPR techniques in cervical, rib, thoracic, lumbar, and sacral  areas  Patient tolerated the procedure well with improvement in symptoms  Patient given exercises, stretches and lifestyle modifications  See medications in patient instructions if given  Patient will follow up in 4-8 weeks    The above documentation has been reviewed and is accurate and complete Amanda Pulley, DO          Note: This dictation was prepared with Dragon dictation along with smaller  phrase technology. Any transcriptional errors that result from this process are unintentional.

## 2022-12-01 ENCOUNTER — Encounter: Payer: Self-pay | Admitting: Family Medicine

## 2022-12-01 ENCOUNTER — Ambulatory Visit: Payer: BC Managed Care – PPO | Admitting: Family Medicine

## 2022-12-01 ENCOUNTER — Ambulatory Visit (INDEPENDENT_AMBULATORY_CARE_PROVIDER_SITE_OTHER): Payer: BC Managed Care – PPO

## 2022-12-01 VITALS — BP 118/84 | Ht 64.0 in | Wt 134.0 lb

## 2022-12-01 DIAGNOSIS — M9904 Segmental and somatic dysfunction of sacral region: Secondary | ICD-10-CM | POA: Diagnosis not present

## 2022-12-01 DIAGNOSIS — M9908 Segmental and somatic dysfunction of rib cage: Secondary | ICD-10-CM

## 2022-12-01 DIAGNOSIS — M545 Low back pain, unspecified: Secondary | ICD-10-CM | POA: Diagnosis not present

## 2022-12-01 DIAGNOSIS — M9901 Segmental and somatic dysfunction of cervical region: Secondary | ICD-10-CM

## 2022-12-01 DIAGNOSIS — M2550 Pain in unspecified joint: Secondary | ICD-10-CM | POA: Diagnosis not present

## 2022-12-01 DIAGNOSIS — M9903 Segmental and somatic dysfunction of lumbar region: Secondary | ICD-10-CM

## 2022-12-01 DIAGNOSIS — M9902 Segmental and somatic dysfunction of thoracic region: Secondary | ICD-10-CM

## 2022-12-01 DIAGNOSIS — G8929 Other chronic pain: Secondary | ICD-10-CM | POA: Diagnosis not present

## 2022-12-01 LAB — FERRITIN: Ferritin: 64.1 ng/mL (ref 10.0–291.0)

## 2022-12-01 LAB — COMPREHENSIVE METABOLIC PANEL
ALT: 11 U/L (ref 0–35)
AST: 17 U/L (ref 0–37)
Albumin: 4.1 g/dL (ref 3.5–5.2)
Alkaline Phosphatase: 58 U/L (ref 39–117)
BUN: 20 mg/dL (ref 6–23)
CO2: 28 mEq/L (ref 19–32)
Calcium: 9.9 mg/dL (ref 8.4–10.5)
Chloride: 102 mEq/L (ref 96–112)
Creatinine, Ser: 0.98 mg/dL (ref 0.40–1.20)
GFR: 67.36 mL/min (ref >60.00)
Glucose, Bld: 85 mg/dL (ref 70–99)
Potassium: 4.7 mEq/L (ref 3.5–5.1)
Sodium: 139 mEq/L (ref 135–145)
Total Bilirubin: 0.4 mg/dL (ref 0.2–1.2)
Total Protein: 7.1 g/dL (ref 6.0–8.3)

## 2022-12-01 LAB — CBC WITH DIFFERENTIAL/PLATELET
Basophils Absolute: 0 10*3/uL (ref 0.0–0.1)
Basophils Relative: 0.5 % (ref 0.0–3.0)
Eosinophils Absolute: 0.1 10*3/uL (ref 0.0–0.7)
Eosinophils Relative: 1.1 % (ref 0.0–5.0)
HCT: 39.4 % (ref 36.0–46.0)
Hemoglobin: 13.2 g/dL (ref 12.0–15.0)
Lymphocytes Relative: 29.8 % (ref 12.0–46.0)
Lymphs Abs: 1.5 10*3/uL (ref 0.7–4.0)
MCHC: 33.5 g/dL (ref 30.0–36.0)
MCV: 88.7 fl (ref 78.0–100.0)
Monocytes Absolute: 0.4 10*3/uL (ref 0.1–1.0)
Monocytes Relative: 7.4 % (ref 3.0–12.0)
Neutro Abs: 3.1 10*3/uL (ref 1.4–7.7)
Neutrophils Relative %: 61.2 % (ref 43.0–77.0)
Platelets: 241 10*3/uL (ref 150.0–400.0)
RBC: 4.44 Mil/uL (ref 3.87–5.11)
RDW: 13.4 % (ref 11.5–15.5)
WBC: 5.1 10*3/uL (ref 4.0–10.5)

## 2022-12-01 LAB — IBC PANEL
Iron: 98 ug/dL (ref 42–145)
Saturation Ratios: 33.5 % (ref 20.0–50.0)
TIBC: 292.6 ug/dL (ref 250.0–450.0)
Transferrin: 209 mg/dL — ABNORMAL LOW (ref 212.0–360.0)

## 2022-12-01 LAB — SEDIMENTATION RATE: Sed Rate: 6 mm/hr (ref 0–30)

## 2022-12-01 LAB — T4, FREE: Free T4: 0.74 ng/dL (ref 0.60–1.60)

## 2022-12-01 LAB — TSH: TSH: 1.87 u[IU]/mL (ref 0.35–5.50)

## 2022-12-01 LAB — VITAMIN B12: Vitamin B-12: 343 pg/mL (ref 211–911)

## 2022-12-01 LAB — VITAMIN D 25 HYDROXY (VIT D DEFICIENCY, FRACTURES): VITD: 53.9 ng/mL (ref 30.00–100.00)

## 2022-12-01 LAB — T3, FREE: T3, Free: 2.9 pg/mL (ref 2.3–4.2)

## 2022-12-01 NOTE — Patient Instructions (Addendum)
Lumbar spine xray today Labs today See me in 7-8 weeks

## 2022-12-01 NOTE — Assessment & Plan Note (Addendum)
Continues to have significant low back pain with some intermittent radicular symptoms that seems to be on the right leg.  Sometimes causes spasms that stops her from activity.  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  We discussed with patient about different stretches that would be good after activities patient is having.  Follow-up again in 6 to 8 weeks we will get labs today as well make sure the cramping is nothing else that is contributing.

## 2022-12-03 LAB — PTH, INTACT AND CALCIUM
Calcium: 9.3 mg/dL (ref 8.6–10.4)
PTH: 31 pg/mL (ref 16–77)

## 2022-12-03 LAB — CALCIUM, IONIZED: Calcium, Ion: 5 mg/dL (ref 4.7–5.5)

## 2022-12-08 ENCOUNTER — Ambulatory Visit
Admission: RE | Admit: 2022-12-08 | Discharge: 2022-12-08 | Disposition: A | Discharge status: Home / Self Care | Payer: BC Managed Care – PPO | Source: Ambulatory Visit | Attending: Obstetrics and Gynecology | Admitting: Obstetrics and Gynecology

## 2022-12-08 DIAGNOSIS — N6320 Unspecified lump in the left breast, unspecified quadrant: Secondary | ICD-10-CM

## 2022-12-15 ENCOUNTER — Ambulatory Visit: Payer: BC Managed Care – PPO | Admitting: Sports Medicine

## 2022-12-15 ENCOUNTER — Other Ambulatory Visit: Payer: Self-pay

## 2022-12-15 VITALS — HR 74 | Ht 64.0 in | Wt 133.0 lb

## 2022-12-15 DIAGNOSIS — M25531 Pain in right wrist: Secondary | ICD-10-CM | POA: Diagnosis not present

## 2022-12-15 DIAGNOSIS — G5601 Carpal tunnel syndrome, right upper limb: Secondary | ICD-10-CM | POA: Diagnosis not present

## 2022-12-15 DIAGNOSIS — M25532 Pain in left wrist: Secondary | ICD-10-CM

## 2022-12-15 NOTE — Progress Notes (Signed)
Benito Mccreedy D.[place] Phone: [phone]   Assessment and Plan:     1. Bilateral wrist pain 2. Right carpal tunnel syndrome  -Chronic with exacerbation, subsequent visit - Recurrent right wrist pain with radicular symptoms in the hand, consistent with carpal tunnel - Patient previously received nearly 3 months relief after carpal tunnel CSI performed on 09/08/2022, however she has had return of symptoms over the past 1 to 2 weeks - Patient ultimately plans on having carpal tunnel release surgery, however she cannot find the time to have the surgery right now, so if she elects for repeat CSI.  Tolerated well per note below - Overall left wrist is well-controlled with intermittent CSI and is not significantly symptomatic at today's visit  Procedure: Ultrasound Guided Carpal Tunnel Injection Side: Right Diagnosis: Carpal tunnel [place] Indication:  - accuracy is paramount for diagnosis - to ensure therapeutic efficacy or procedural success - to reduce procedural risk  After explaining the procedure, viable alternatives, risks, and answering any questions, consent was given verbally. The site was cleaned with chlorhexidine prep. An ultrasound transducer was placed on the volar aspect of the wrist.  The median nerve was identified.   An injection was performed under ultrasound guidance from the ulnar approach with care used to avoid ulnar artery.   0.5 ml of 1% lidocaine without epinephrine was used to hydrodissect the median nerve from the retinaculum.  20 mg of triamcinolone (KENALOG) 40mg /ml was then deposited adjacent to but not into the median nerve.  This was well tolerated and resulted in this Dr. Glennon Mac relief.  Needle was removed and dressing placed and post injection instructions were given including  a discussion of likely return of pain today after the anesthetic wears off (with the possibility of worsened  pain) until the steroid starts to work in 1-3 days.   Pt was advised to call or return to clinic if these symptoms worsen or fail to improve as anticipated.   Pertinent previous records reviewed include none   Follow Up: as needed3     Subjective:   I, Moenique Parris, am serving as a Education administrator for Doctor Glennon Mac  Chief Complaint: right wrist pain   HPI:   12/15/22 Patient is a 52 year old female complaining of right wrist pain. Patient states that she has had pain for about 2 weeks, here for a CSI, no new MOI  Relevant Historical Information: none pertinent   Additional pertinent review of systems negative.   Current Outpatient Medications:    amphetamine-dextroamphetamine (ADDERALL XR) 10 MG 24 hr capsule, Take 1 capsule (10 mg total) by mouth daily., Disp: 30 capsule, Rfl: 0   Biotin 10000 MCG TABS, Take 10,000 mcg by mouth daily., Disp: , Rfl:    FLUoxetine (PROZAC) 40 MG capsule, Take 40 mg by mouth daily., Disp: , Rfl:    hydrOXYzine (ATARAX/VISTARIL) 10 MG tablet, TAKE 1 TABLET BY MOUTH THREE TIMES A DAY AS NEEDED, Disp: 270 tablet, Rfl: 0   hyoscyamine (LEVBID) 0.375 MG 12 hr tablet, TAKE 1 TABLET BY MOUTH TWICE A DAY, Disp: 180 tablet, Rfl: 0   nitrofurantoin (FURADANTIN) 25 MG/5ML suspension, Take 100 mg by mouth 4 (four) times daily., Disp: , Rfl:    ondansetron (ZOFRAN) 4 MG tablet, Take 1 tablet (4 mg total) by mouth every 6 (six) hours as needed for nausea or vomiting., Disp: 12 tablet, Rfl: 0   UNABLE TO FIND, CBD  Gummies at bedtime, Disp: , Rfl:    Vitamin D, Ergocalciferol, (DRISDOL) 1.25 MG (50000 UNIT) CAPS capsule, TAKE 1 CAPSULE (50,000 UNITS TOTAL) BY MOUTH EVERY 7 (SEVEN) DAYS, Disp: 12 capsule, Rfl: 0   Objective:     Vitals:   12/15/22 1303  Pulse: 74  SpO2: 98%  Weight: 133 lb (60.3 kg)  Height: 5\' 4"  (1.626 m)      Body mass index is 22.83 kg/m.    Physical Exam:    General: Appears well, nad, nontoxic and pleasant Neuro:sensation  intact, strength is 5/5 with df/pf/inv/ev, muscle tone wnl Skin:no susupicious lesions or rashes  right Wrist:   No deformity or swelling appreciated. ROM  Ext 90, flexion70, radial/ulnar deviation 30 TTP anterior wrist nttp over the snuff box, dorsal carpals, volar carpals, radial styloid, ulnar styloid, 1st mcp, tfcc + Tinel's Negative finklestein Neg tfcc bounce test Mild pain with passive ext, flex or deviation    Electronically signed by:  Benito Mccreedy D.[facility] 2:14 PM 12/15/22

## 2023-01-18 NOTE — Progress Notes (Unsigned)
[facility] [address] [place] [address] Phone: [phone] Subjective:   Bruce Donath, am serving as a scribe for Dr. Antoine Primas.  I'm seeing this patient by the request  of:  Laurann Montana, MD  CC: Low back pain  [id number]  Amanda [name] is a 52 y.o. female coming in with complaint of back and neck pain. OMT on 12/01/2022. Patient states that her spine is not feeling good today.   R groin pain for past 2 weeks. Painful to ER hip. Pain feels like cramping sensation. Does have radiating pain down the R leg. Had some tingling in the leg as well as her hands. Feels tingling in both feet at times.    Medications patient has been prescribed: Vit D  Taking:         Reviewed prior external information including notes and imaging from previsou exam, outside providers and external EMR if available.   As well as notes that were available from care everywhere and other healthcare systems.  Past medical history, social, surgical and family history all reviewed in electronic medical record.  No pertanent information unless stated regarding to the chief complaint.   Past Medical History:  Diagnosis Date   Anxiety    Breast mass 04/2012   left breast   Complication of anesthesia    states was hard to wake up post-op   Depression    Dry cough    since vocal cord surgery   Sleep related teeth grinding    wears mouth guard at night    Allergies  Allergen Reactions   Chloraprep One Step [Chlorhexidine Gluconate]    Oxycodone Rash    Rash all over    Lamotrigine    Sulfamethoxazole Nausea And Vomiting   Vitamin E      Review of Systems:  No headache, visual changes, nausea, vomiting, diarrhea, constipation, dizziness, abdominal pain, skin rash, fevers, chills, night sweats, weight loss, swollen lymph nodes, body aches, joint swelling, chest pain, shortness of breath, mood changes. POSITIVE muscle aches  Objective  Blood  pressure 118/82, pulse 80, height  (1.626 m), weight 132 lb (59.9 kg), SpO2 98 %.   General: No apparent distress alert and oriented x3 mood and affect normal, dressed appropriately.  HEENT: Pupils equal, extraocular movements intact  Respiratory: Patient's speak in full sentences and does not appear short of breath  Cardiovascular: No lower extremity edema, non tender, no erythema  Gait relatively normal MSK:  Back does have some loss of lordosis.  Tightness around the sacroiliac joint.  Osteopathic findings  C7 flexed rotated and side bent left T4 extended rotated and side bent right inhaled rib T8 extended rotated and side bent left L2 flexed rotated and side bent right L4 flexed rotated and side bent left Sacrum right on right       Assessment and Plan:  Neck muscle spasm Significant tightness in the neck noted as well as low back pain and responded extremely well to osteopathic manipulation but this does seem to be a chronic problem with worsening symptoms.  Prednisone given with patient going to be traveling over the course of the next several months.  [place] the [place].  Discussed icing regimen and home exercises.  Increase activity slowly.  Follow-up again in 2 to 3 months when patient returns    Nonallopathic problems  Decision today to treat with OMT was based on Physical Exam  After verbal  consent patient was treated with HVLA, ME, FPR techniques in cervical, rib, thoracic, lumbar, and sacral  areas  Patient tolerated the procedure well with improvement in symptoms  Patient given exercises, stretches and lifestyle modifications  See medications in patient instructions if given  Patient will follow up in 4-8 weeks    The above documentation has been reviewed and is accurate and complete Judi Saa, DO          Note: This dictation was prepared with Dragon dictation along with smaller phrase technology. Any transcriptional errors that  result from this process are unintentional.

## 2023-01-19 ENCOUNTER — Encounter: Payer: Self-pay | Admitting: Family Medicine

## 2023-01-19 ENCOUNTER — Other Ambulatory Visit: Payer: Self-pay | Admitting: Family Medicine

## 2023-01-19 ENCOUNTER — Ambulatory Visit: Payer: BC Managed Care – PPO | Admitting: Family Medicine

## 2023-01-19 VITALS — BP 118/82 | HR 80 | Ht 64.0 in | Wt 132.0 lb

## 2023-01-19 DIAGNOSIS — M9908 Segmental and somatic dysfunction of rib cage: Secondary | ICD-10-CM | POA: Diagnosis not present

## 2023-01-19 DIAGNOSIS — M9903 Segmental and somatic dysfunction of lumbar region: Secondary | ICD-10-CM

## 2023-01-19 DIAGNOSIS — M62838 Other muscle spasm: Secondary | ICD-10-CM

## 2023-01-19 DIAGNOSIS — M9901 Segmental and somatic dysfunction of cervical region: Secondary | ICD-10-CM | POA: Diagnosis not present

## 2023-01-19 DIAGNOSIS — M9904 Segmental and somatic dysfunction of sacral region: Secondary | ICD-10-CM

## 2023-01-19 DIAGNOSIS — M9902 Segmental and somatic dysfunction of thoracic region: Secondary | ICD-10-CM

## 2023-01-19 MED ORDER — PREDNISONE 20 MG PO TABS: 40.0000 mg | ORAL_TABLET | Freq: Every day | ORAL | 0 refills | Status: AC

## 2023-01-19 MED ORDER — HYDROXYZINE HCL 10 MG PO TABS: 10.0000 mg | ORAL_TABLET | Freq: Three times a day (TID) | ORAL | 0 refills | Status: DC | PRN

## 2023-01-19 NOTE — Assessment & Plan Note (Signed)
Significant tightness in the neck noted as well as low back pain and responded extremely well to osteopathic manipulation but this does seem to be a chronic problem with worsening symptoms.  Prednisone given with patient going to be traveling over the course of the next several months.  He will be out of the country.  Discussed icing regimen and home exercises.  Increase activity slowly.  Follow-up again in 2 to 3 months when patient returns

## 2023-01-19 NOTE — Patient Instructions (Addendum)
Prednisone   Hydroxizine called in  Tart cherry extract  at night See [place] when you get back

## 2023-01-25 ENCOUNTER — Encounter: Payer: Self-pay | Admitting: Family Medicine

## 2023-01-26 ENCOUNTER — Encounter: Payer: Self-pay | Admitting: Plastic Surgery

## 2023-01-26 ENCOUNTER — Ambulatory Visit: Payer: BC Managed Care – PPO | Admitting: Plastic Surgery

## 2023-01-26 ENCOUNTER — Other Ambulatory Visit: Payer: Self-pay

## 2023-01-26 DIAGNOSIS — F17210 Nicotine dependence, cigarettes, uncomplicated: Secondary | ICD-10-CM | POA: Diagnosis not present

## 2023-01-26 DIAGNOSIS — F4522 Body dysmorphic disorder: Secondary | ICD-10-CM | POA: Diagnosis not present

## 2023-01-26 DIAGNOSIS — R21 Rash and other nonspecific skin eruption: Secondary | ICD-10-CM | POA: Insufficient documentation

## 2023-01-26 MED ORDER — VITAMIN D (ERGOCALCIFEROL) 1.25 MG (50000 UNIT) PO CAPS: 50000.0000 [IU] | ORAL_CAPSULE | ORAL | 0 refills | Status: DC

## 2023-01-26 NOTE — Progress Notes (Signed)
Patient ID: Amanda [name], female    DOB: 11-23-1970, 52 y.o.   MRN: [id number]   Chief Complaint  Patient presents with   Skin Problem    The patient is a 52 year old female here for evaluation of her legs.  The patient has had COVID twice.  The last time was over a year ago.  She has noticed changes in her overall skin texture with increase in bruising and occasional rashes.  It seems to happen once a month.  She was seen by Dr. Reche [name] in February and has not had an episode since.  He diagnosed her with cutaneous eruption and recommended biopsy if it occurs again.  She has several GI symptoms and states that she has been seen by gastroenterologist.  She is smoking although down to 1 or 2 cigarettes/day.  She has a family history of breast cancer.  She comes planes of GI bloating and has not been able to identify any certain food type.  She thinks she is in menopause as she has not had a period for over 3 years.  I do not see anything on her skin today.  She is got good peripheral pulses in her lower extremities.  No lymphadenopathy noted.  Her main complaint is feeling weak in her legs.     Review of Systems  Constitutional:  Positive for activity change. Negative for appetite change.  Eyes: Negative.   Respiratory: Negative.    Cardiovascular:  Positive for leg swelling.  Gastrointestinal: Negative.   Endocrine: Negative.   Genitourinary: Negative.   Musculoskeletal: Negative.     Past Medical History:  Diagnosis Date   Anxiety    Breast mass 04/2012   left breast   Complication of anesthesia    states was hard to wake up post-op   Depression    Dry cough    since vocal cord surgery   Sleep related teeth grinding    wears mouth guard at night    Past Surgical History:  Procedure Laterality Date   AUGMENTATION MAMMAPLASTY Bilateral 2014   BREAST MASS EXCISION Left 05/26/2012   CERVIX SURGERY  2005   papilloma   UMBILICAL HERNIA REPAIR  as a child   UHR   vocal cord  polyp excision  1999      Current Outpatient Medications:    amphetamine-dextroamphetamine (ADDERALL XR) 10 MG 24 hr capsule, Take 1 capsule (10 mg total) by mouth daily., Disp: 30 capsule, Rfl: 0   FLUoxetine (PROZAC) 40 MG capsule, Take 40 mg by mouth daily., Disp: , Rfl:    hydrOXYzine (ATARAX) 10 MG tablet, Take 1 tablet (10 mg total) by mouth 3 (three) times daily as needed., Disp: 90 tablet, Rfl: 0   hyoscyamine (LEVBID) 0.375 MG 12 hr tablet, TAKE 1 TABLET BY MOUTH TWICE A DAY, Disp: 180 tablet, Rfl: 0   ondansetron (ZOFRAN) 4 MG tablet, Take 1 tablet (4 mg total) by mouth every 6 (six) hours as needed for nausea or vomiting., Disp: 12 tablet, Rfl: 0   predniSONE (DELTASONE) 20 MG tablet, Take 2 tablets (40 mg total) by mouth daily with breakfast., Disp: 20 tablet, Rfl: 0   UNABLE TO FIND, CBD Gummies at bedtime, Disp: , Rfl:    Vitamin D, Ergocalciferol, (DRISDOL) 1.25 MG (50000 UNIT) CAPS capsule, Take 1 capsule (50,000 Units total) by mouth every 7 (seven) days., Disp: 6 capsule, Rfl: 0   Objective:   There were no vitals filed for this visit.  Physical Exam Vitals and nursing note reviewed.  Constitutional:      Appearance: Normal appearance.  HENT:     Head: Atraumatic.  Cardiovascular:     Rate and Rhythm: Normal rate.     Pulses: Normal pulses.  Pulmonary:     Effort: Pulmonary effort is normal.  Abdominal:     General: There is no distension.     Palpations: Abdomen is soft. There is no mass.     Tenderness: There is no abdominal tenderness.     Hernia: No hernia is present.  Musculoskeletal:        General: No swelling or deformity.  Skin:    General: Skin is warm.     Capillary Refill: Capillary refill takes less than 2 seconds.     Coloration: Skin is not jaundiced or pale.     Findings: No bruising or lesion.  Neurological:     Mental Status: She is alert and oriented to person, place, and time.  Psychiatric:        Mood and Affect: Mood normal.         Behavior: Behavior normal.        Thought Content: Thought content normal.        Judgment: Judgment normal.     Assessment & Plan:  Body dysmorphic disorder  Cutaneous eruption  I recommend no tobacco, decrease carbohydrates and sugars and increase protein intake and water intake.  She may benefit from allergy testing for both environmental and food allergies and a visit to the gastroenterologist for further evaluation.  If she has any other breakouts of encouraged her to give me a call and I would be happy to biopsy it as well.  Also encouraged her to follow-up with Dr. Gaye [name] so if breakouts occur again.  Amanda Bills Adaleah Forget, DO

## 2023-02-12 ENCOUNTER — Other Ambulatory Visit: Payer: Self-pay | Admitting: Family Medicine

## 2023-05-18 ENCOUNTER — Other Ambulatory Visit: Payer: Self-pay | Admitting: Family Medicine

## 2023-05-22 IMAGING — US US BREAST*L* LIMITED INC AXILLA
1 series · 4 of 4 positions shown · non-contrast
Comparison: Previous exam(s).

CLINICAL DATA: 50-year-old female presenting for annual bilateral
mammogram in 1 year follow-up of a probably benign left breast mass.

EXAM:
DIGITAL DIAGNOSTIC BILATERAL MAMMOGRAM WITH IMPLANTS, CAD AND
TOMOSYNTHESIS; ULTRASOUND LEFT BREAST LIMITED
TECHNIQUE: Bilateral digital diagnostic mammography and breast tomosynthesis
was performed. The images were evaluated with computer-aided
detection. Standard and/or implant displaced views were performed.;
Targeted ultrasound examination of the left breast was performed.

[Series 1: us breast*left* limited inc axilla · 0.06mm/px · 4 of 4 slices shown]
[im 1/4]
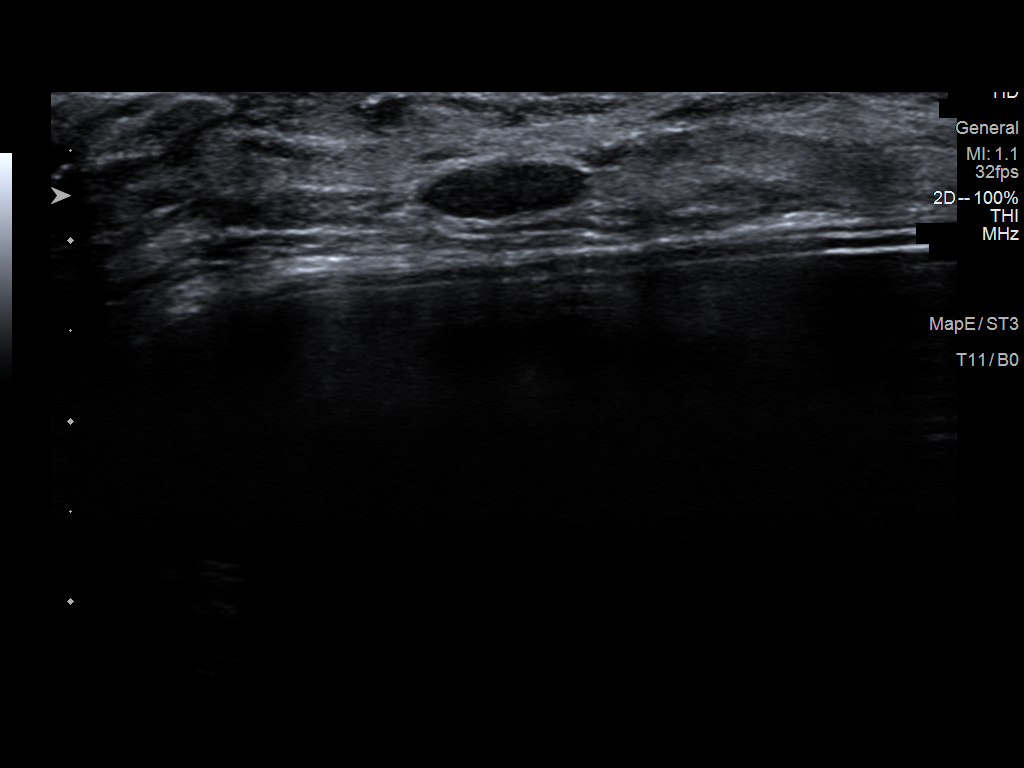
[im 2/4]
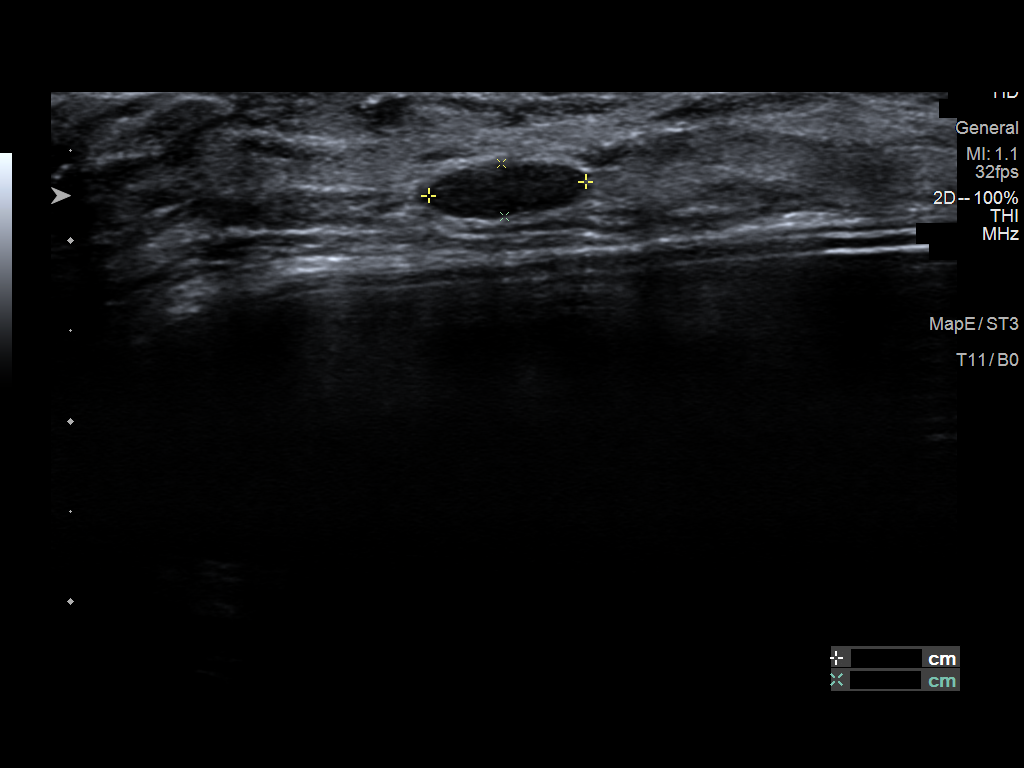
[im 3/4]
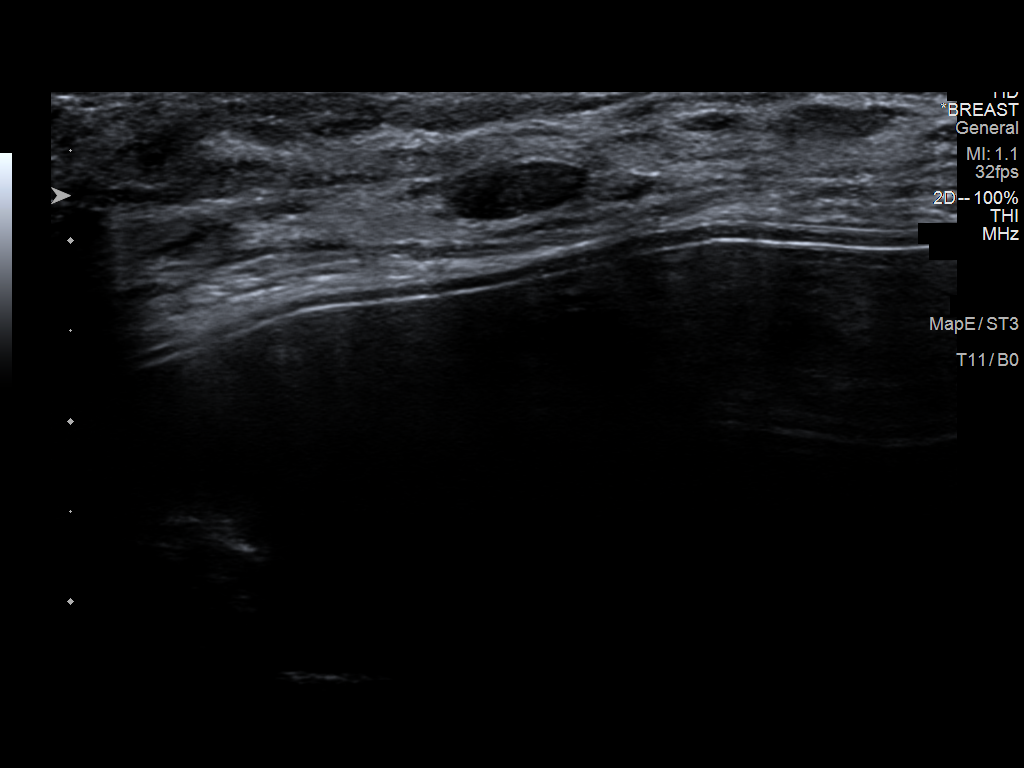
[im 4/4]
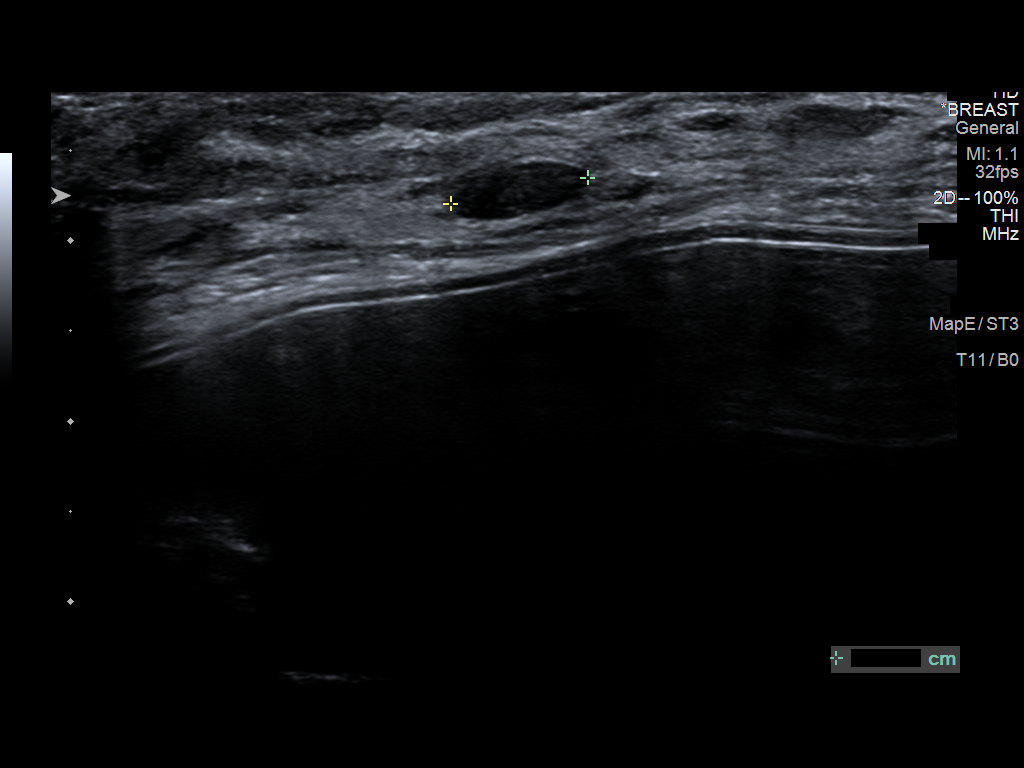

[4 of 4 positions shown; findings below may reference images not displayed]

ACR Breast Density Category d: The breast tissue is extremely dense,
which lowers the sensitivity of mammography.
FINDINGS: No focal or suspicious mammographic findings are identified in
either breast. The parenchymal pattern is stable. The patient has
retropectoral implants.

Targeted ultrasound is performed, showing an oval, circumscribed
hypoechoic mass at the [DATE] position 1 cm from the nipple. It
measures 9 x 3 x 8 mm (previously 8 x 3 x 8 mm).
IMPRESSION: 1. Stable, probably benign left breast mass. Recommend a final
ultrasound follow-up in 1 year.
2. No mammographic evidence of malignancy on the right.

RECOMMENDATION:
Bilateral diagnostic mammogram and left breast ultrasound in 1 year.

I have discussed the findings and recommendations with the patient.
If applicable, a reminder letter will be sent to the patient
regarding the next appointment.

BI-RADS CATEGORY  3: Probably benign.

## 2023-05-22 IMAGING — MG MM  DIGITAL DIAGNOSTIC BREAST BILAT IMPLANT W/ TOMO W/ CAD
8 of 13 series · 8 of 29 positions shown · non-contrast
Comparison: Previous exam(s).

CLINICAL DATA: 50-year-old female presenting for annual bilateral
mammogram in 1 year follow-up of a probably benign left breast mass.

EXAM:
DIGITAL DIAGNOSTIC BILATERAL MAMMOGRAM WITH IMPLANTS, CAD AND
TOMOSYNTHESIS; ULTRASOUND LEFT BREAST LIMITED
TECHNIQUE: Bilateral digital diagnostic mammography and breast tomosynthesis
was performed. The images were evaluated with computer-aided
detection. Standard and/or implant displaced views were performed.;
Targeted ultrasound examination of the left breast was performed.

[R MLO (1 of 2)]
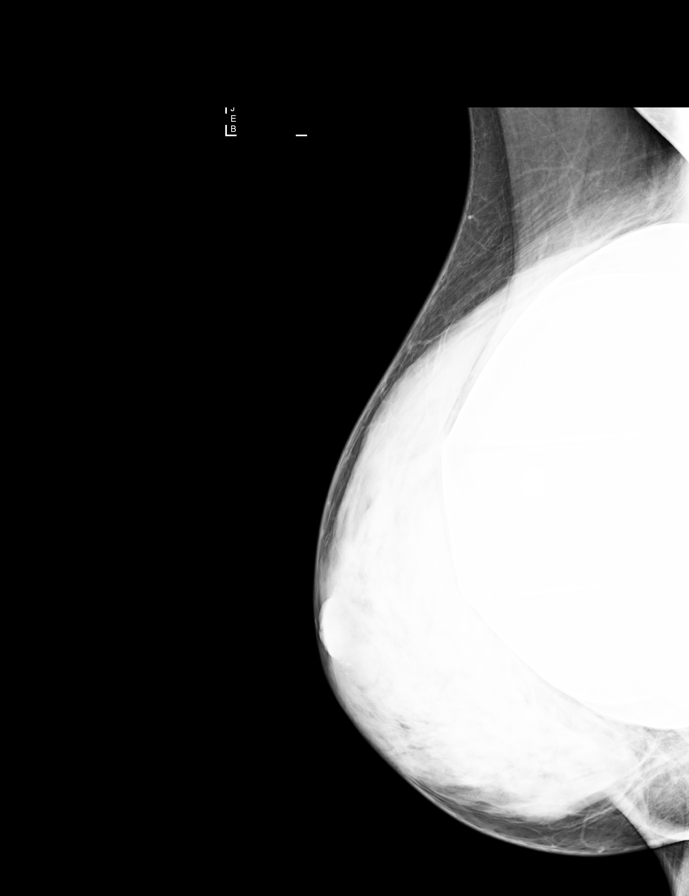

[R CC]
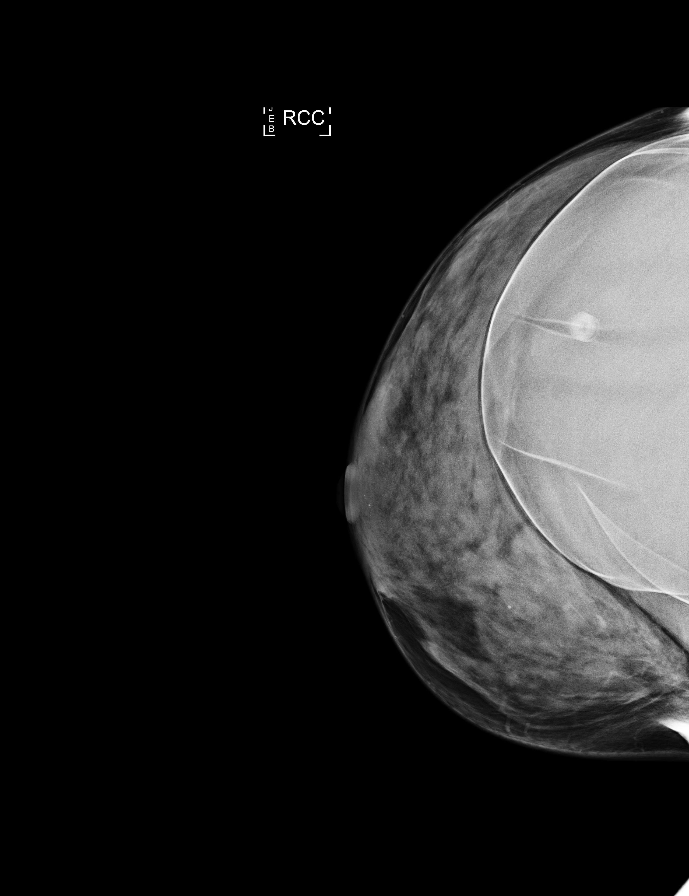

[L CC]
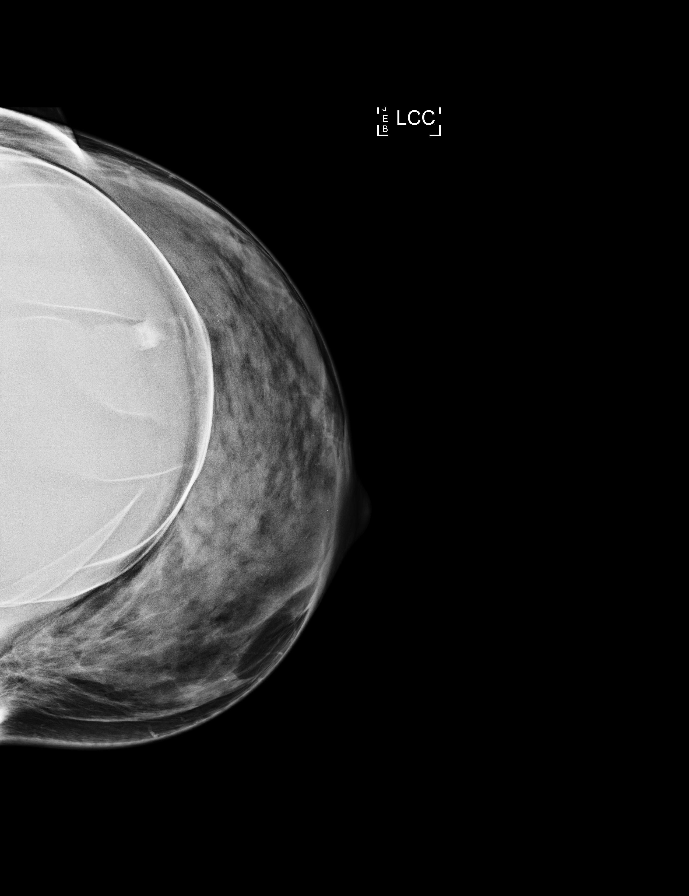

[R MLO (2 of 2)]
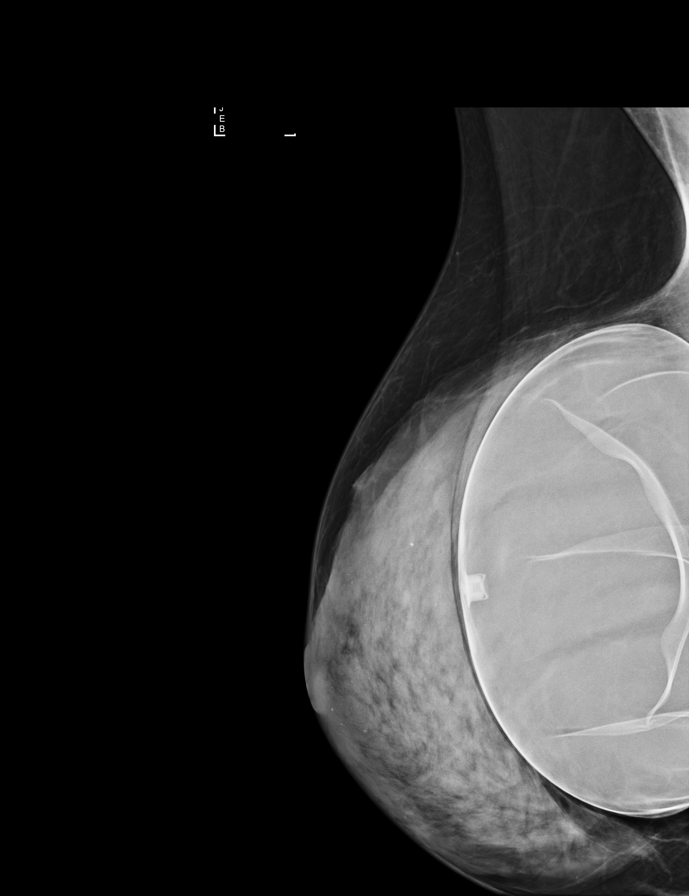

[L MLO]
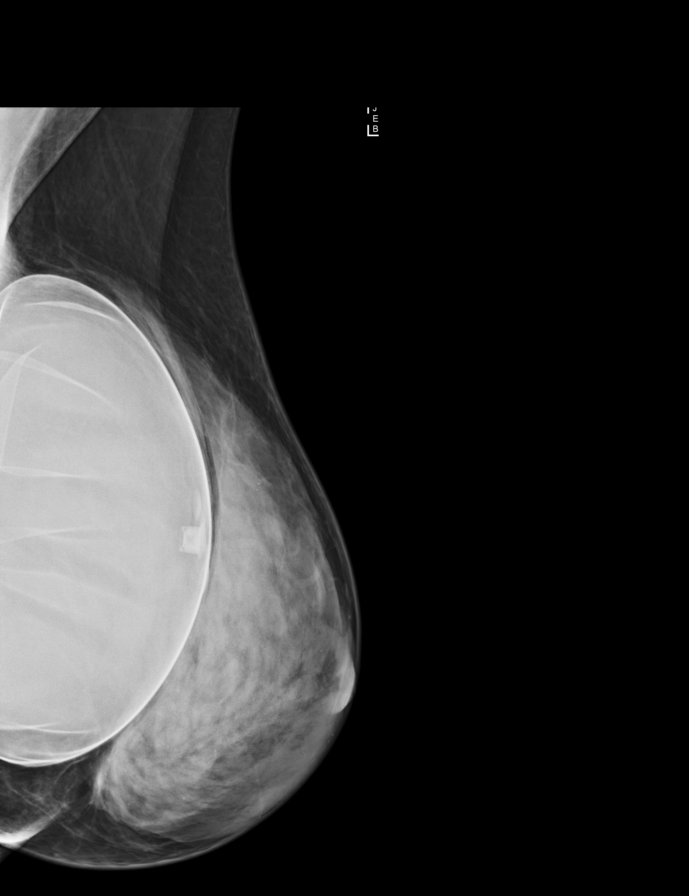

[R CC synth-2D]
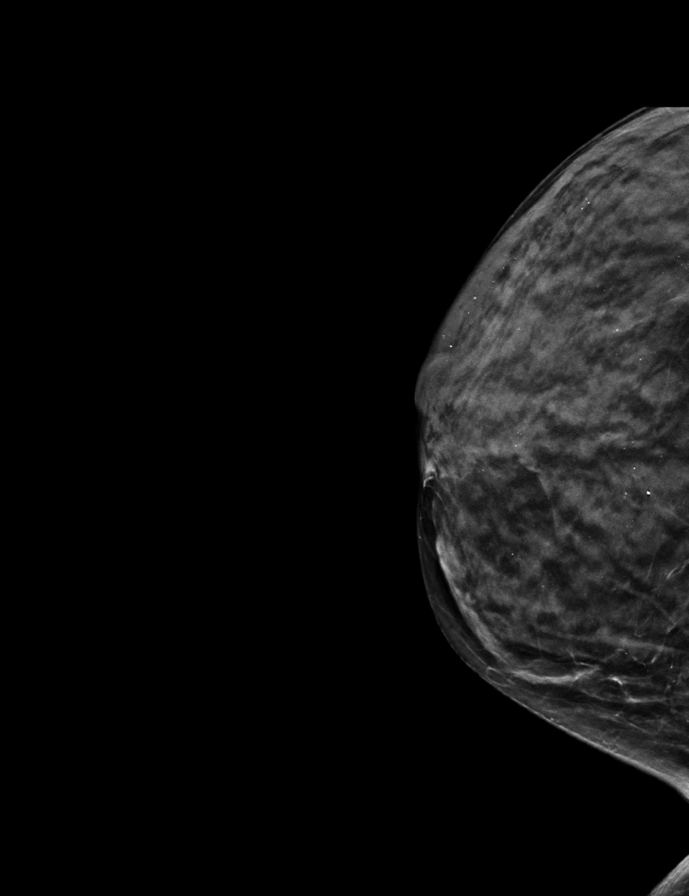

[R MLO synth-2D]
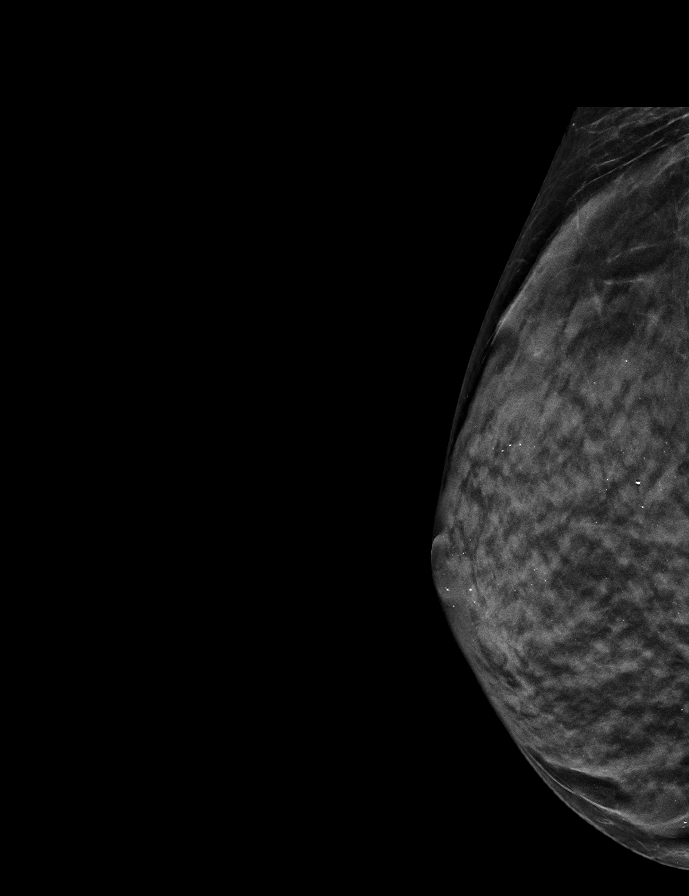

[L MLO synth-2D]
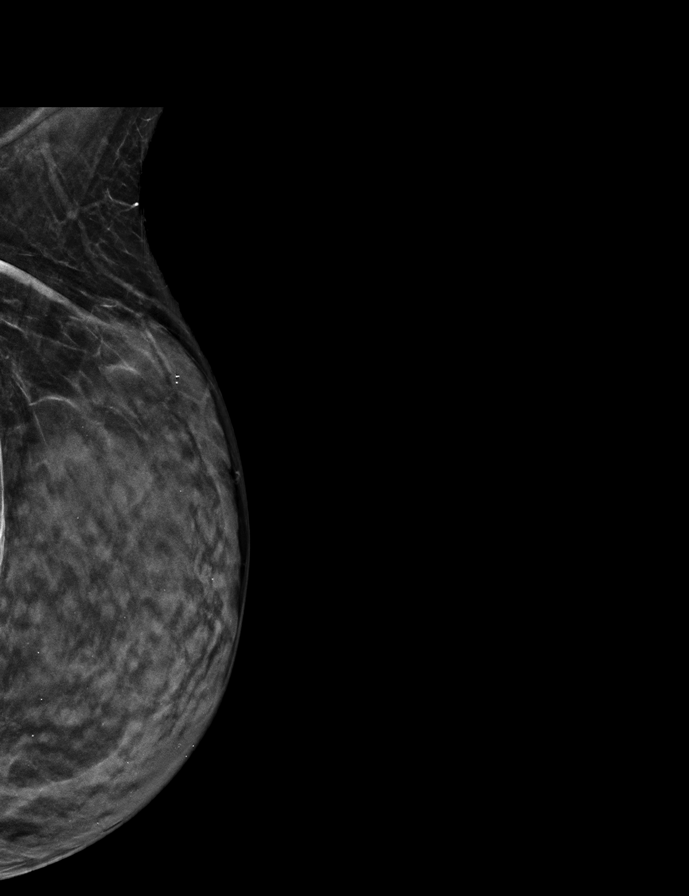

[8 of 29 positions shown; findings below may reference images not displayed]

ACR Breast Density Category d: The breast tissue is extremely dense,
which lowers the sensitivity of mammography.
FINDINGS: No focal or suspicious mammographic findings are identified in
either breast. The parenchymal pattern is stable. The patient has
retropectoral implants.

Targeted ultrasound is performed, showing an oval, circumscribed
hypoechoic mass at the [DATE] position 1 cm from the nipple. It
measures 9 x 3 x 8 mm (previously 8 x 3 x 8 mm).
IMPRESSION: 1. Stable, probably benign left breast mass. Recommend a final
ultrasound follow-up in 1 year.
2. No mammographic evidence of malignancy on the right.

RECOMMENDATION:
Bilateral diagnostic mammogram and left breast ultrasound in 1 year.

I have discussed the findings and recommendations with the patient.
If applicable, a reminder letter will be sent to the patient
regarding the next appointment.

BI-RADS CATEGORY  3: Probably benign.

## 2023-05-24 ENCOUNTER — Ambulatory Visit (INDEPENDENT_AMBULATORY_CARE_PROVIDER_SITE_OTHER): Payer: BC Managed Care – PPO | Admitting: Sports Medicine

## 2023-05-24 ENCOUNTER — Other Ambulatory Visit: Payer: Self-pay

## 2023-05-24 VITALS — HR 77 | Ht 64.0 in | Wt 136.0 lb

## 2023-05-24 DIAGNOSIS — G5603 Carpal tunnel syndrome, bilateral upper limbs: Secondary | ICD-10-CM | POA: Diagnosis not present

## 2023-05-24 DIAGNOSIS — M25531 Pain in right wrist: Secondary | ICD-10-CM

## 2023-05-24 NOTE — Progress Notes (Signed)
Amanda [name] D.[name] [address] [place] [address] Phone: [phone]   Assessment and Plan:     1. Bilateral wrist pain 2. Bilateral carpal tunnel syndrome -Chronic with exacerbation, subsequent visit - Recurrent bilateral wrist pain consistent with flares in the past but had significant relief with carpal tunnel CSI.  Last CSI performed on right on 12/15/2022.  Patient elects for bilateral carpal tunnel CSI at today's visit.  Tolerated well per note below - Patient has been having diminished relief from carpal tunnel CSI and is interested in discussing carpal tunnel release with orthopedic surgery.  She is interested in establishing care and discussing a timeframe such as fall break or Christmas break where she could possibly have procedure done - Ambulatory referral to Orthopedic Surgery    Procedure: Ultrasound Guided Carpal Tunnel Injection Side: Bilateral Diagnosis: Carpal tunnel syndrome [place] Indication:  - accuracy is paramount for diagnosis - to ensure therapeutic efficacy or procedural success - to reduce procedural risk  After explaining the procedure, viable alternatives, risks, and answering any questions, consent was given verbally. The site was cleaned with chlorhexidine prep. An ultrasound transducer was placed on the volar aspect of the wrist.  The median nerve was identified.   An injection was performed under ultrasound guidance from the ulnar approach with care used to avoid ulnar artery.  1ml of 1% lidocaine without epinephrine was used to hydrodissect the median nerve from the retinaculum.  40 mg of triamcinolone (KENALOG) 40mg /ml was then deposited adjacent to but not into the median nerve.  This was well tolerated and resulted in  relief.  Needle was removed and dressing placed and post injection instructions were given including  a discussion of likely return of pain today after the anesthetic wears off (with the  possibility of worsened pain) until the steroid starts to work in 1-3 days.  Procedure was repeated on contralateral side.  Pt was advised to call or return to clinic if these symptoms worsen or fail to improve as anticipated.   Pertinent previous records reviewed include none   Follow Up: As needed if no improvement or worsening of symptoms   Subjective:   I, Amanda [name], am serving as a Neurosurgeon for Doctor Richardean Sale   Chief Complaint: right wrist pain    HPI:    12/15/22 Patient is a 52 year old female complaining of right wrist pain. Patient states that she has had pain for about 2 weeks, here for a CSI, no new MOI  05/24/2023 Patient states she has been in pain flare all summer here for CSI bilat she isnt able to sleep through the night    Relevant Historical Information: none pertinent   Additional pertinent review of systems negative.   Current Outpatient Medications:    amphetamine-dextroamphetamine (ADDERALL XR) 10 MG 24 hr capsule, Take 1 capsule (10 mg total) by mouth daily., Disp: 30 capsule, Rfl: 0   FLUoxetine (PROZAC) 40 MG capsule, Take 40 mg by mouth daily., Disp: , Rfl:    hydrOXYzine (ATARAX) 10 MG tablet, TAKE 1 TABLET BY MOUTH THREE TIMES A DAY AS NEEDED, Disp: 270 tablet, Rfl: 1   hyoscyamine (LEVBID) 0.375 MG 12 hr tablet, TAKE 1 TABLET BY MOUTH TWICE A DAY, Disp: 180 tablet, Rfl: 0   ondansetron (ZOFRAN) 4 MG tablet, Take 1 tablet (4 mg total) by mouth every 6 (six) hours as needed for nausea or vomiting., Disp: 12 tablet, Rfl: 0   predniSONE (  DELTASONE) 20 MG tablet, Take 2 tablets (40 mg total) by mouth daily with breakfast., Disp: 20 tablet, Rfl: 0   UNABLE TO FIND, CBD Gummies at bedtime, Disp: , Rfl:    Vitamin D, Ergocalciferol, (DRISDOL) 1.25 MG (50000 UNIT) CAPS capsule, TAKE 1 CAPSULE (50,000 UNITS TOTAL) BY MOUTH EVERY 7 (SEVEN) DAYS, Disp: 6 capsule, Rfl: 0   Objective:     Vitals:   05/24/23 1516  Pulse: 77  SpO2: 99%  Weight: 136 lb  (61.7 kg)  Height: 5\' 4"  (1.626 m)      Body mass index is 23.34 kg/m.    Physical Exam:    General: Appears well, nad, nontoxic and pleasant Neuro:sensation intact, strength is 5/5 with df/pf/inv/ev, muscle tone wnl Skin:no susupicious lesions or rashes   Bilateral wrist:   No deformity or swelling appreciated. ROM  Ext 90, flexion70, radial/ulnar deviation 30 TTP anterior wrist nttp over the snuff box, dorsal carpals, volar carpals, radial styloid, ulnar styloid, 1st mcp, tfcc + Tinel's Negative finklestein Neg tfcc bounce test Mild pain with passive ext, flex or deviation     Electronically signed by:  Amanda [name] D.[name] 3:38 PM 05/24/23

## 2023-05-24 NOTE — Patient Instructions (Signed)
Ortho referral  As needed referral

## 2023-05-27 ENCOUNTER — Ambulatory Visit: Payer: BC Managed Care – PPO | Admitting: Family Medicine

## 2023-06-16 ENCOUNTER — Other Ambulatory Visit: Payer: Self-pay | Admitting: Family Medicine

## 2023-06-21 NOTE — Progress Notes (Unsigned)
Amanda [name] [address] [place] [address] Phone: [phone] Subjective:   Amanda [name], am serving as a scribe for Amanda [name].  I'm seeing this patient by the request  of:  Amanda Montana, MD  CC: Back and neck pain follow-up  [name]  Amanda [name] is a 52 y.o. female coming in with complaint of back and neck pain. OMT on 01/19/2023. Patient states here for manipulation. Wants to talk about surgery.  Medications patient has been prescribed: hydroxyzine  Taking:         Reviewed prior external information including notes and imaging from previsou exam, outside providers and external EMR if available.   As well as notes that were available from care everywhere and other healthcare systems.  Past medical history, social, surgical and family history all reviewed in electronic medical record.  No pertanent information unless stated regarding to the chief complaint.   Past Medical History:  Diagnosis Date   Anxiety    Breast mass 04/2012   left breast   Complication of anesthesia    states was hard to wake up post-op   Depression    Dry cough    since vocal cord surgery   Sleep related teeth grinding    wears mouth guard at night    Allergies  Allergen Reactions   Chloraprep One Step [Chlorhexidine Gluconate]    Oxycodone Rash    Rash all over    Lamotrigine    Sulfamethoxazole Nausea And Vomiting   Vitamin E      Review of Systems:  No headache, visual changes, nausea, vomiting, diarrhea, constipation, dizziness, abdominal pain, skin rash, fevers, chills, night sweats, weight loss, swollen lymph nodes, body aches, joint swelling, chest pain, shortness of breath, mood changes. POSITIVE muscle aches  Objective  Blood pressure 112/80, pulse 87, height 5\' 4"  (1.626 m), weight 130 lb (59 kg), SpO2 98%.   General: No apparent distress alert and oriented x3 mood and affect normal, dressed appropriately.  HEENT:  Pupils equal, extraocular movements intact  Respiratory: Patient's speak in full sentences and does not appear short of breath  Cardiovascular: No lower extremity edema, non tender, no erythema  Gait MSK:  Back does have some loss lordosis noted.  Some tenderness to palpation right greater than left.  Seems to be more in the sacroiliac joint.  Positive Tinel's noted.  Some thenar eminence wasting on the right hand.  Osteopathic findings  C2 flexed rotated and side bent right C6 flexed rotated and side bent left T3 extended rotated and side bent right inhaled rib T9 extended rotated and side bent left L2 flexed rotated and side bent right Sacrum right on right       Assessment and Plan:  Right carpal tunnel syndrome Right greater then left but is bilateral.  Patient has had injections for greater than 4 years but no longer responding to the injections and would like patient to follow-up with hand surgery to discuss other modalities at this time.  Follow-up with me as needed for this problem.   Low back pain Has been sometime since we have seen patient.  She has been traveling over the summer and has not been doing the home exercises regularly.  Discussed icing regimen and home exercises and core strengthening hip abductor strengthening.  Responds well to osteopathic manipulation.  Follow-up again in 6 to 8 weeks otherwise    Nonallopathic problems  Decision today to treat with OMT was  based on Physical Exam  After verbal consent patient was treated with HVLA, ME, FPR techniques in cervical, rib, thoracic, lumbar, and sacral  areas  Patient tolerated the procedure well with improvement in symptoms  Patient given exercises, stretches and lifestyle modifications  See medications in patient instructions if given  Patient will follow up in 4-8 weeks    The above documentation has been reviewed and is accurate and complete Amanda Saa, DO          Note: This  dictation was prepared with Dragon dictation along with smaller phrase technology. Any transcriptional errors that result from this process are unintentional.

## 2023-06-22 ENCOUNTER — Ambulatory Visit: Payer: BC Managed Care – PPO | Admitting: Family Medicine

## 2023-06-22 ENCOUNTER — Encounter: Payer: Self-pay | Admitting: Family Medicine

## 2023-06-22 VITALS — BP 112/80 | HR 87 | Ht 64.0 in | Wt 130.0 lb

## 2023-06-22 DIAGNOSIS — M9904 Segmental and somatic dysfunction of sacral region: Secondary | ICD-10-CM

## 2023-06-22 DIAGNOSIS — M9901 Segmental and somatic dysfunction of cervical region: Secondary | ICD-10-CM

## 2023-06-22 DIAGNOSIS — G5601 Carpal tunnel syndrome, right upper limb: Secondary | ICD-10-CM | POA: Diagnosis not present

## 2023-06-22 DIAGNOSIS — M9902 Segmental and somatic dysfunction of thoracic region: Secondary | ICD-10-CM | POA: Diagnosis not present

## 2023-06-22 DIAGNOSIS — M9908 Segmental and somatic dysfunction of rib cage: Secondary | ICD-10-CM | POA: Diagnosis not present

## 2023-06-22 DIAGNOSIS — M545 Low back pain, unspecified: Secondary | ICD-10-CM | POA: Diagnosis not present

## 2023-06-22 DIAGNOSIS — G8929 Other chronic pain: Secondary | ICD-10-CM

## 2023-06-22 DIAGNOSIS — M9903 Segmental and somatic dysfunction of lumbar region: Secondary | ICD-10-CM

## 2023-06-22 NOTE — Patient Instructions (Signed)
Good to see you! Dr. Amanda Pea [facility] See you again in 6-8 weeks

## 2023-06-22 NOTE — Assessment & Plan Note (Addendum)
Right greater then left but is bilateral.  Patient has had injections for greater than 4 years but no longer responding to the injections and would like patient to follow-up with hand surgery to discuss other modalities at this time.  Follow-up with me as needed for this problem.

## 2023-06-22 NOTE — Assessment & Plan Note (Signed)
Has been sometime since we have seen patient.  She has been traveling over the summer and has not been doing the home exercises regularly.  Discussed icing regimen and home exercises and core strengthening hip abductor strengthening.  Responds well to osteopathic manipulation.  Follow-up again in 6 to 8 weeks otherwise

## 2023-08-05 ENCOUNTER — Ambulatory Visit: Payer: BC Managed Care – PPO | Admitting: Family Medicine

## 2023-08-11 ENCOUNTER — Encounter: Payer: Self-pay | Admitting: Psychiatry

## 2023-08-11 NOTE — Progress Notes (Signed)
[facility] [address] [place] [address] Phone: [phone] Subjective:   Bruce Donath, am serving as a scribe for Dr. Antoine Primas.  I'm seeing this patient by the request  of:  Laurann Montana, MD  CC: back pain   [name]  Amanda [name] is a 52 y.o. female coming in with complaint of back and neck pain. OMT 06/22/2023. Back pain is much better. Not exercising at all.   Also f/u for R carpal tunnel. Patient states that she is going to do surgery for her R wrist. Pain in 4th and 5th fingers and in elbow due to carpal tunnel. Would like injection but may try to get surgery before end of the year.   Also taking supplement from [place] that she has questions about.   Patient has been bruising easily over both quads.   Medications patient has been prescribed: Vit D, Hydroxizine  Taking:         Reviewed prior external information including notes and imaging from previsou exam, outside providers and external EMR if available.   As well as notes that were available from care everywhere and other healthcare systems.  Past medical history, social, surgical and family history all reviewed in electronic medical record.  No pertanent information unless stated regarding to the chief complaint.   Past Medical History:  Diagnosis Date   Anxiety    Breast mass 04/2012   left breast   Complication of anesthesia    states was hard to wake up post-op   Depression    Dry cough    since vocal cord surgery   Sleep related teeth grinding    wears mouth guard at night    Allergies  Allergen Reactions   Chloraprep One Step [Chlorhexidine Gluconate]    Oxycodone Rash    Rash all over    Lamotrigine    Sulfamethoxazole Nausea And Vomiting   Vitamin E      Review of Systems:  No headache, visual changes, nausea, vomiting, diarrhea, constipation, dizziness, abdominal pain, skin rash, fevers, chills, night sweats, weight loss, swollen  lymph nodes, body aches, joint swelling, chest pain, shortness of breath, mood changes. POSITIVE muscle aches  Objective  Blood pressure 92/78, pulse 100, height 5\' 4"  (1.626 m), weight 131 lb (59.4 kg), SpO2 98%.   General: No apparent distress alert and oriented x3 mood and affect normal, dressed appropriately.  HEENT: Pupils equal, extraocular movements intact  Respiratory: Patient's speak in full sentences and does not appear short of breath  Cardiovascular: No lower extremity edema, non tender, no erythema  Gait MSK:  Back low back does have some loss lordosis noted.  Some tightness noted in the gluteal areas bilaterally.  Tightness in the left groin area more than usual.  Worsening pain with extension of the back.  Osteopathic findings  C3 flexed rotated and side bent right C6 flexed rotated and side bent left T3 extended rotated and side bent right inhaled rib T6 extended rotated and side bent left L2 flexed rotated and side bent right Sacrum right on right       Assessment and Plan:  Neck muscle spasm Neck spasm noted.  Discussed icing regimen and home exercises.  Discussed which activities to do and which ones to avoid.  Does respond well to osteopathic manipulation.  Increase activity slowly as well.  Follow-up again in 6 to 8 weeks otherwise.    Nonallopathic problems  Decision today to treat with  OMT was based on Physical Exam  After verbal consent patient was treated with HVLA, ME, FPR techniques in cervical, rib, thoracic, lumbar, and sacral  areas  Patient tolerated the procedure well with improvement in symptoms  Patient given exercises, stretches and lifestyle modifications  See medications in patient instructions if given  Patient will follow up in 4-8 weeks     The above documentation has been reviewed and is accurate and complete Judi Saa, DO         Note: This dictation was prepared with Dragon dictation along with smaller phrase  technology. Any transcriptional errors that result from this process are unintentional.

## 2023-08-12 ENCOUNTER — Encounter: Payer: Self-pay | Admitting: Family Medicine

## 2023-08-12 ENCOUNTER — Ambulatory Visit: Payer: BC Managed Care – PPO | Admitting: Family Medicine

## 2023-08-12 VITALS — BP 92/78 | HR 100 | Ht 64.0 in | Wt 131.0 lb

## 2023-08-12 DIAGNOSIS — M9908 Segmental and somatic dysfunction of rib cage: Secondary | ICD-10-CM | POA: Diagnosis not present

## 2023-08-12 DIAGNOSIS — M9901 Segmental and somatic dysfunction of cervical region: Secondary | ICD-10-CM | POA: Diagnosis not present

## 2023-08-12 DIAGNOSIS — M62838 Other muscle spasm: Secondary | ICD-10-CM

## 2023-08-12 DIAGNOSIS — M9904 Segmental and somatic dysfunction of sacral region: Secondary | ICD-10-CM | POA: Diagnosis not present

## 2023-08-12 DIAGNOSIS — M2550 Pain in unspecified joint: Secondary | ICD-10-CM | POA: Diagnosis not present

## 2023-08-12 DIAGNOSIS — M9902 Segmental and somatic dysfunction of thoracic region: Secondary | ICD-10-CM

## 2023-08-12 DIAGNOSIS — M9903 Segmental and somatic dysfunction of lumbar region: Secondary | ICD-10-CM | POA: Diagnosis not present

## 2023-08-12 LAB — IBC PANEL
Iron: 128 ug/dL (ref 42–145)
Saturation Ratios: 42.9 % (ref 20.0–50.0)
TIBC: 298.2 ug/dL (ref 250.0–450.0)
Transferrin: 213 mg/dL (ref 212.0–360.0)

## 2023-08-12 LAB — CBC WITH DIFFERENTIAL/PLATELET
Basophils Absolute: 0 10*3/uL (ref 0.0–0.1)
Basophils Relative: 0.6 % (ref 0.0–3.0)
Eosinophils Absolute: 0 10*3/uL (ref 0.0–0.7)
Eosinophils Relative: 0.9 % (ref 0.0–5.0)
HCT: 39.3 % (ref 36.0–46.0)
Hemoglobin: 13.2 g/dL (ref 12.0–15.0)
Lymphocytes Relative: 33.2 % (ref 12.0–46.0)
Lymphs Abs: 1.7 10*3/uL (ref 0.7–4.0)
MCHC: 33.5 g/dL (ref 30.0–36.0)
MCV: 89.9 fL (ref 78.0–100.0)
Monocytes Absolute: 0.4 10*3/uL (ref 0.1–1.0)
Monocytes Relative: 8.3 % (ref 3.0–12.0)
Neutro Abs: 2.9 10*3/uL (ref 1.4–7.7)
Neutrophils Relative %: 57 % (ref 43.0–77.0)
Platelets: 266 10*3/uL (ref 150.0–400.0)
RBC: 4.38 Mil/uL (ref 3.87–5.11)
RDW: 13.7 % (ref 11.5–15.5)
WBC: 5.1 10*3/uL (ref 4.0–10.5)

## 2023-08-12 LAB — COMPREHENSIVE METABOLIC PANEL
ALT: 10 U/L (ref 0–35)
AST: 13 U/L (ref 0–37)
Albumin: 4.2 g/dL (ref 3.5–5.2)
Alkaline Phosphatase: 47 U/L (ref 39–117)
BUN: 21 mg/dL (ref 6–23)
CO2: 30 meq/L (ref 19–32)
Calcium: 9.4 mg/dL (ref 8.4–10.5)
Chloride: 104 meq/L (ref 96–112)
Creatinine, Ser: 0.81 mg/dL (ref 0.40–1.20)
GFR: 84.25 mL/min (ref >60.00)
Glucose, Bld: 65 mg/dL — ABNORMAL LOW (ref 70–99)
Potassium: 4.4 meq/L (ref 3.5–5.1)
Sodium: 139 meq/L (ref 135–145)
Total Bilirubin: 0.6 mg/dL (ref 0.2–1.2)
Total Protein: 6.8 g/dL (ref 6.0–8.3)

## 2023-08-12 LAB — FERRITIN: Ferritin: 75.4 ng/mL (ref 10.0–291.0)

## 2023-08-12 LAB — URIC ACID: Uric Acid, Serum: 3.2 mg/dL (ref 2.4–7.0)

## 2023-08-12 LAB — TESTOSTERONE: Testosterone: 38.77 ng/dL (ref 15.00–40.00)

## 2023-08-12 LAB — VITAMIN B12: Vitamin B-12: 338 pg/mL (ref 211–911)

## 2023-08-12 LAB — VITAMIN D 25 HYDROXY (VIT D DEFICIENCY, FRACTURES): VITD: 83.67 ng/mL (ref 30.00–100.00)

## 2023-08-12 LAB — SEDIMENTATION RATE: Sed Rate: 5 mm/h (ref 0–30)

## 2023-08-12 LAB — PROTIME-INR
INR: 1.1 {ratio} — ABNORMAL HIGH (ref 0.8–1.0)
Prothrombin Time: 11.3 s (ref 9.6–13.1)

## 2023-08-12 NOTE — Assessment & Plan Note (Addendum)
Neck spasm noted.  Discussed icing regimen and home exercises.  Discussed which activities to do and which ones to avoid.  Does respond well to osteopathic manipulation.  Increase activity slowly as well.  Follow-up again in 6 to 8 weeks otherwise.  Patient is having some easily bruising so we will get some labs to further evaluate

## 2023-08-12 NOTE — Patient Instructions (Addendum)
coQ 10 up to 200mg  daily Nadh is ok Let's check labs See me in 8 weeks

## 2023-10-14 ENCOUNTER — Ambulatory Visit: Payer: BC Managed Care – PPO | Admitting: Family Medicine

## 2023-10-15 NOTE — Progress Notes (Signed)
[facility] [address] [place] [address] Phone: [phone] Subjective:   Amanda [name], am serving as a scribe for Dr. Antoine Primas.  I'm seeing this patient by the request  of:  Laurann Montana, MD  CC: Back and neck pain follow-up  [id number]  Amanda [name] is a 53 y.o. female coming in with complaint of back and neck pain. OMT on 08/12/2023. Patient states same per usual. No new concerns.  Patient is trying to be more active but secondary to the holidays has had some difficulty.  Medications patient has been prescribed:   Taking:      Since we have seen patient patient has undergone a hemorrhoidectomy.   Reviewed prior external information including notes and imaging from previsou exam, outside providers and external EMR if available.   As well as notes that were available from care everywhere and other healthcare systems.  Past medical history, social, surgical and family history all reviewed in electronic medical record.  No pertanent information unless stated regarding to the chief complaint.   Past Medical History:  Diagnosis Date   Anxiety    Breast mass 04/2012   left breast   Complication of anesthesia    states was hard to wake up post-op   Depression    Dry cough    since vocal cord surgery   Sleep related teeth grinding    wears mouth guard at night    Allergies  Allergen Reactions   Chloraprep One Step [Chlorhexidine Gluconate]    Oxycodone Rash    Rash all over    Lamotrigine    Sulfamethoxazole Nausea And Vomiting   Vitamin E      Review of Systems:  No headache, visual changes, nausea, vomiting, diarrhea, constipation, dizziness, abdominal pain, skin rash, fevers, chills, night sweats, weight loss, swollen lymph nodes, body aches, joint swelling, chest pain, shortness of breath, mood changes. POSITIVE muscle aches  Objective  Blood pressure 110/64, pulse 93, height 5\' 4"  (1.626 m), weight 130 lb  (59 kg), SpO2 98%.   General: No apparent distress alert and oriented x3 mood and affect normal, dressed appropriately.  HEENT: Pupils equal, extraocular movements intact  Respiratory: Patient's speak in full sentences and does not appear short of breath  Cardiovascular: No lower extremity edema, non tender, no erythema  Gait MSK: Back exam does have some loss lordosis noted.  Some tenderness to palpation in the paraspinal musculature. Low back worsening pain over the L5-S1 area.  Osteopathic findings  C2 flexed rotated and side bent right C6 flexed rotated and side bent left T3 extended rotated and side bent right inhaled rib T9 extended rotated and side bent left L2 flexed rotated and side bent right Sacrum right on right       Assessment and Plan:  Low back pain Low back does have some loss of lordosis noted.  Some tenderness to palpation paraspinal musculature.  Patient does have tenderness there is still tightness in the lower back that I think is exacerbated from patient having recent cold.  Does respond extremely well though to osteopathic manipulation.  Follow-up again 6 to 8 weeks discussed magnesium supplementation.    Nonallopathic problems  Decision today to treat with OMT was based on Physical Exam  After verbal consent patient was treated with HVLA, ME, FPR techniques in cervical, rib, thoracic, lumbar, and sacral  areas  Patient tolerated the procedure well with improvement in symptoms  Patient given exercises, stretches  and lifestyle modifications  See medications in patient instructions if given  Patient will follow up in 4-8 weeks    The above documentation has been reviewed and is accurate and complete Judi Saa, DO          Note: This dictation was prepared with Dragon dictation along with smaller phrase technology. Any transcriptional errors that result from this process are unintentional.

## 2023-10-21 ENCOUNTER — Ambulatory Visit: Payer: 59 | Admitting: Family Medicine

## 2023-10-21 VITALS — BP 110/64 | HR 93 | Ht 64.0 in | Wt 130.0 lb

## 2023-10-21 DIAGNOSIS — M9903 Segmental and somatic dysfunction of lumbar region: Secondary | ICD-10-CM | POA: Diagnosis not present

## 2023-10-21 DIAGNOSIS — M9904 Segmental and somatic dysfunction of sacral region: Secondary | ICD-10-CM

## 2023-10-21 DIAGNOSIS — M9908 Segmental and somatic dysfunction of rib cage: Secondary | ICD-10-CM

## 2023-10-21 DIAGNOSIS — M9902 Segmental and somatic dysfunction of thoracic region: Secondary | ICD-10-CM

## 2023-10-21 DIAGNOSIS — M545 Low back pain, unspecified: Secondary | ICD-10-CM | POA: Diagnosis not present

## 2023-10-21 DIAGNOSIS — M9901 Segmental and somatic dysfunction of cervical region: Secondary | ICD-10-CM

## 2023-10-21 DIAGNOSIS — G8929 Other chronic pain: Secondary | ICD-10-CM

## 2023-10-21 NOTE — Assessment & Plan Note (Signed)
Low back does have some loss of lordosis noted.  Some tenderness to palpation paraspinal musculature.  Patient does have tenderness there is still tightness in the lower back that I think is exacerbated from patient having recent cold.  Does respond extremely well though to osteopathic manipulation.  Follow-up again 6 to 8 weeks discussed magnesium supplementation.

## 2023-10-21 NOTE — Patient Instructions (Signed)
Good to see you  Calm magnesium  Follow up in 6-8 weeks

## 2023-10-23 ENCOUNTER — Encounter: Payer: Self-pay | Admitting: Family Medicine

## 2023-11-11 ENCOUNTER — Other Ambulatory Visit: Payer: Self-pay | Admitting: Obstetrics and Gynecology

## 2023-11-11 DIAGNOSIS — Z1231 Encounter for screening mammogram for malignant neoplasm of breast: Secondary | ICD-10-CM

## 2023-11-22 ENCOUNTER — Telehealth: Payer: Self-pay | Admitting: Family Medicine

## 2023-11-22 ENCOUNTER — Other Ambulatory Visit: Payer: Self-pay

## 2023-11-22 MED ORDER — VITAMIN D (ERGOCALCIFEROL) 1.25 MG (50000 UNIT) PO CAPS: 50000.0000 [IU] | ORAL_CAPSULE | ORAL | 0 refills | Status: DC

## 2023-11-22 NOTE — Telephone Encounter (Signed)
 Patient called requesting a refill on: Vitamin D, Ergocalciferol, (DRISDOL) 1.25 MG (50000 UNIT) CAPS capsule   Pharmacy: [facility] (this is a different one then last time)

## 2023-12-09 ENCOUNTER — Ambulatory Visit: Payer: 59

## 2023-12-09 ENCOUNTER — Ambulatory Visit: Payer: 59 | Admitting: Family Medicine

## 2023-12-14 ENCOUNTER — Ambulatory Visit: Payer: 59

## 2023-12-16 ENCOUNTER — Ambulatory Visit

## 2023-12-21 ENCOUNTER — Ambulatory Visit
Admission: RE | Admit: 2023-12-21 | Discharge: 2023-12-21 | Disposition: A | Discharge status: Home / Self Care | Source: Ambulatory Visit | Attending: Obstetrics and Gynecology

## 2023-12-21 DIAGNOSIS — Z1231 Encounter for screening mammogram for malignant neoplasm of breast: Secondary | ICD-10-CM

## 2023-12-24 NOTE — Progress Notes (Signed)
 Amanda [name] [address] [place] [address] Phone: [phone] Subjective:   [name], am serving as a scribe for Dr. Antoine Primas.  I'm seeing this patient by the request  of:  Laurann Montana, MD  CC: Left wrist pain, back pain and neck pain  [name]  [name] is a 53 y.o. female coming in with complaint of back and neck pain. OMT on 10/21/2023. Patient states doing well. Wrist is bother her. No other concerns.  Medications patient has been prescribed:   Taking:         Reviewed prior external information including notes and imaging from previsou exam, outside providers and external EMR if available.   As well as notes that were available from care everywhere and other healthcare systems.  Past medical history, social, surgical and family history all reviewed in electronic medical record.  No pertanent information unless stated regarding to the chief complaint.   Past Medical History:  Diagnosis Date   Anxiety    Breast mass 04/2012   left breast   Complication of anesthesia    states was hard to wake up post-op   Depression    Dry cough    since vocal cord surgery   Sleep related teeth grinding    wears mouth guard at night    Allergies  Allergen Reactions   Chloraprep One Step [Chlorhexidine Gluconate]    Oxycodone Rash    Rash all over    Lamotrigine    Sulfamethoxazole Nausea And Vomiting   Vitamin E      Review of Systems:  No headache, visual changes, nausea, vomiting, diarrhea, constipation, dizziness, abdominal pain, skin rash, fevers, chills, night sweats, weight loss, swollen lymph nodes, body aches, joint swelling, chest pain, shortness of breath, mood changes. POSITIVE muscle aches  Objective  Pulse 87, height 5\' 4"  (1.626 m), weight 129 lb (58.5 kg), SpO2 99%.   General: No apparent distress alert and oriented x3 mood and affect normal, dressed appropriately.  HEENT: Pupils equal,  extraocular movements intact  Respiratory: Patient's speak in full sentences and does not appear short of breath  Cardiovascular: No lower extremity edema, non tender, no erythema  Gait MSK:  Back back does have some loss lordosis noted.  Some tenderness to palpation in the paraspinal musculature.  Neck exam also has some tightness noted with sidebending bilaterally.  Negative Spurling's noted.  Left wrist positive Tinel's noted.  Some mild weakness with grip strength.  Procedure: Real-time Ultrasound Guided Injection of left carpal tunnel Device: GE Logiq Q7 Ultrasound guided injection is preferred based studies that show increased duration, increased effect, greater accuracy, decreased procedural pain, increased response rate with ultrasound guided versus blind injection.  Verbal informed consent obtained.  Time-out conducted.  Noted no overlying erythema, induration, or other signs of local infection.  Skin prepped in a sterile fashion.  Local anesthesia: Topical Ethyl chloride.  With sterile technique and under real time ultrasound guidance:  median nerve visualized.  23g 5/8 inch needle inserted distal to proximal approach into nerve sheath. Pictures taken nfor needle placement. Patient did have injection of 0.5 cc of 0.5% Marcaine, and 0.5 cc of Kenalog 40 mg/dL. Completed without difficulty  Pain immediately resolved suggesting accurate placement of the medication.  Advised to call if fevers/chills, erythema, induration, drainage, or persistent bleeding.  Images permanently stored   Impression: Technically successful ultrasound guided injection.  Osteopathic findings  C2 flexed rotated and side bent right  C6 flexed rotated and side bent left T3 extended rotated and side bent right inhaled rib T9 extended rotated and side bent left L2 flexed rotated and side bent right Sacrum right on right       Assessment and Plan:  Left carpal tunnel syndrome Patient given injection  and tolerated the procedure well, discussed icing regimen and home exercises, discussed which activities to do and which ones to avoid.  Increase activity slowly.  Follow-up with me again in 6 to 8 weeks.  May need injection on the contralateral side.  Low back pain Low back does have some loss lordosis noted.  Some tenderness to palpation in the paraspinal musculature.  We will continue to monitor.  Continue to work on core strength.  Patient will be going to [place] for the summer so we will see her before she moves to discuss further treatment and do any refills necessary of her medications.    Nonallopathic problems  Decision today to treat with OMT was based on Physical Exam  After verbal consent patient was treated with HVLA, ME, FPR techniques in cervical, rib, thoracic, lumbar, and sacral  areas  Patient tolerated the procedure well with improvement in symptoms  Patient given exercises, stretches and lifestyle modifications  See medications in patient instructions if given  Patient will follow up in 4-8 weeks     The above documentation has been reviewed and is accurate and complete Judi Saa, DO         Note: This dictation was prepared with Dragon dictation along with smaller phrase technology. Any transcriptional errors that result from this process are unintentional.

## 2023-12-30 ENCOUNTER — Ambulatory Visit: Admitting: Family Medicine

## 2023-12-30 ENCOUNTER — Encounter: Payer: Self-pay | Admitting: Family Medicine

## 2023-12-30 ENCOUNTER — Other Ambulatory Visit: Payer: Self-pay

## 2023-12-30 VITALS — HR 87 | Ht 64.0 in | Wt 129.0 lb

## 2023-12-30 DIAGNOSIS — M25531 Pain in right wrist: Secondary | ICD-10-CM

## 2023-12-30 DIAGNOSIS — M9902 Segmental and somatic dysfunction of thoracic region: Secondary | ICD-10-CM | POA: Diagnosis not present

## 2023-12-30 DIAGNOSIS — M545 Low back pain, unspecified: Secondary | ICD-10-CM

## 2023-12-30 DIAGNOSIS — M9901 Segmental and somatic dysfunction of cervical region: Secondary | ICD-10-CM

## 2023-12-30 DIAGNOSIS — M25532 Pain in left wrist: Secondary | ICD-10-CM

## 2023-12-30 DIAGNOSIS — G5602 Carpal tunnel syndrome, left upper limb: Secondary | ICD-10-CM

## 2023-12-30 DIAGNOSIS — M9908 Segmental and somatic dysfunction of rib cage: Secondary | ICD-10-CM | POA: Diagnosis not present

## 2023-12-30 DIAGNOSIS — G8929 Other chronic pain: Secondary | ICD-10-CM

## 2023-12-30 DIAGNOSIS — M9903 Segmental and somatic dysfunction of lumbar region: Secondary | ICD-10-CM | POA: Diagnosis not present

## 2023-12-30 DIAGNOSIS — M9904 Segmental and somatic dysfunction of sacral region: Secondary | ICD-10-CM

## 2023-12-30 NOTE — Patient Instructions (Signed)
 Good to see you! Injection in L wrist today See you again in 5-6 weeks

## 2023-12-30 NOTE — Assessment & Plan Note (Addendum)
 Patient given injection and tolerated the procedure well, discussed icing regimen and home exercises, discussed which activities to do and which ones to avoid.  Increase activity slowly.  Follow-up with me again in 6 to 8 weeks.  May need injection on the contralateral side.

## 2023-12-30 NOTE — Assessment & Plan Note (Signed)
 Low back does have some loss lordosis noted.  Some tenderness to palpation in the paraspinal musculature.  We will continue to monitor.  Continue to work on core strength.  Patient will be going to [place] for the summer so we will see her before she moves to discuss further treatment and do any refills necessary of her medications.

## 2024-01-04 ENCOUNTER — Other Ambulatory Visit: Payer: Self-pay | Admitting: Family Medicine

## 2024-02-09 NOTE — Progress Notes (Unsigned)
 Amanda [name] [address] [place] [address] Phone: [phone] Subjective:   Amanda [name], am serving as a scribe for Dr. Ronnell [name].  I'm seeing this patient by the request  of:  Amanda Grave, MD  CC: Back and neck pain follow-up  [name]  Amanda [name] is a 52 y.o. female coming in with complaint of back and neck pain. OMT on 12/30/2023. Also looking at R wrist. Patient states needs new Vit D prescription.  Medications patient has been prescribed: Vit D  Taking:         Reviewed prior external information including notes and imaging from previsou exam, outside providers and external EMR if available.   As well as notes that were available from care everywhere and other healthcare systems.  Past medical history, social, surgical and family history all reviewed in electronic medical record.  No pertanent information unless stated regarding to the chief complaint.   Past Medical History:  Diagnosis Date   Anxiety    Breast mass 04/2012   left breast   Complication of anesthesia    states was hard to wake up post-op   Depression    Dry cough    since vocal cord surgery   Sleep related teeth grinding    wears mouth guard at night    Allergies  Allergen Reactions   Chloraprep One Step [Chlorhexidine Gluconate]    Oxycodone  Rash    Rash all over    Lamotrigine     Sulfamethoxazole  Nausea And Vomiting   Vitamin E      Review of Systems:  No headache, visual changes, nausea, vomiting, diarrhea, constipation, dizziness, abdominal pain, skin rash, fevers, chills, night sweats, weight loss, swollen lymph nodes, body aches, joint swelling, chest pain, shortness of breath, mood changes. POSITIVE muscle aches  Objective  Blood pressure 106/64, pulse 82, height 5\' 4"  (1.626 m), weight 135 lb (61.2 kg), SpO2 98%.   General: No apparent distress alert and oriented x3 mood and affect normal, dressed appropriately.  HEENT:  Pupils equal, extraocular movements intact  Respiratory: Patient's speak in full sentences and does not appear short of breath  Cardiovascular: No lower extremity edema, non tender, no erythema  Gait MSK:  Back does have some loss lordosis noted.  Some tenderness to palpation of the paraspinal musculature.  Some tightness noted in the neck especially with sidebending right greater than left.  Right wrist exam does have a positive Tinel's noted.  Very mild thenar eminence wasting noted on the right.  Procedure: Real-time Ultrasound Guided Injection of right carpal tunnel Device: GE Logiq Q7  Ultrasound guided injection is preferred based studies that show increased duration, increased effect, greater accuracy, decreased procedural pain, increased response rate with ultrasound guided versus blind injection.  Verbal informed consent obtained.  Time-out conducted.  Noted no overlying erythema, induration, or other signs of local infection.  Skin prepped in a sterile fashion.  Local anesthesia: Topical Ethyl chloride.  With sterile technique and under real time ultrasound guidance:  median nerve visualized.  23g 5/8 inch needle inserted distal to proximal approach into nerve sheath. Pictures taken nfor needle placement. Patient did have injection of 0.5 cc of 0.5% Marcaine , and 0.5 cc of Kenalog  40 mg/dL. Completed without difficulty  Pain immediately resolved suggesting accurate placement of the medication.  Advised to call if fevers/chills, erythema, induration, drainage, or persistent bleeding.  Images saved Impression: Technically successful ultrasound guided injection.  Osteopathic findings  C2 flexed  rotated and side bent right C6 flexed rotated and side bent right T3 extended rotated and side bent right inhaled rib T7 extended rotated and side bent left L2 flexed rotated and side bent right Sacrum right on right       Assessment and Plan:  Right carpal tunnel syndrome Has  been sometime since we have done the injection in the site.  Discussed icing regimen and home exercises.  Discussed which activities to do and which ones to avoid.  Increase activity slowly.  Discussed icing regimen.  Follow-up again 6 to 8 weeks or when patient comes back into the country.  Discussed bracing at night.  If continuing and worsening symptoms would be to consider the possibility of which activities to do.  Low back pain Multifactorial.  Discussed which activities to do and which ones to avoid.  Increase activity slowly.  Discussed icing regimen.  Patient will be traveling follow-up again in 6 to 8 weeks otherwise.    Nonallopathic problems  Decision today to treat with OMT was based on Physical Exam  After verbal consent patient was treated with HVLA, ME, FPR techniques in cervical, rib, thoracic, lumbar, and sacral  areas  Patient tolerated the procedure well with improvement in symptoms  Patient given exercises, stretches and lifestyle modifications  See medications in patient instructions if given  Patient will follow up in 4-8 weeks    The above documentation has been reviewed and is accurate and complete Amanda Frederic M Destyni Hoppel, DO          Note: This dictation was prepared with Dragon dictation along with smaller phrase technology. Any transcriptional errors that result from this process are unintentional.

## 2024-02-10 ENCOUNTER — Other Ambulatory Visit: Payer: Self-pay

## 2024-02-10 ENCOUNTER — Encounter: Payer: Self-pay | Admitting: Family Medicine

## 2024-02-10 ENCOUNTER — Ambulatory Visit: Admitting: Family Medicine

## 2024-02-10 VITALS — BP 106/64 | HR 82 | Ht 64.0 in | Wt 135.0 lb

## 2024-02-10 DIAGNOSIS — M9903 Segmental and somatic dysfunction of lumbar region: Secondary | ICD-10-CM

## 2024-02-10 DIAGNOSIS — M25531 Pain in right wrist: Secondary | ICD-10-CM

## 2024-02-10 DIAGNOSIS — M9902 Segmental and somatic dysfunction of thoracic region: Secondary | ICD-10-CM

## 2024-02-10 DIAGNOSIS — G8929 Other chronic pain: Secondary | ICD-10-CM

## 2024-02-10 DIAGNOSIS — M9908 Segmental and somatic dysfunction of rib cage: Secondary | ICD-10-CM | POA: Diagnosis not present

## 2024-02-10 DIAGNOSIS — M9901 Segmental and somatic dysfunction of cervical region: Secondary | ICD-10-CM

## 2024-02-10 DIAGNOSIS — M9904 Segmental and somatic dysfunction of sacral region: Secondary | ICD-10-CM

## 2024-02-10 DIAGNOSIS — G5601 Carpal tunnel syndrome, right upper limb: Secondary | ICD-10-CM | POA: Diagnosis not present

## 2024-02-10 DIAGNOSIS — M545 Low back pain, unspecified: Secondary | ICD-10-CM

## 2024-02-10 DIAGNOSIS — M25532 Pain in left wrist: Secondary | ICD-10-CM

## 2024-02-10 MED ORDER — VITAMIN D (ERGOCALCIFEROL) 1.25 MG (50000 UNIT) PO CAPS: 50000.0000 [IU] | ORAL_CAPSULE | ORAL | 0 refills | Status: DC

## 2024-02-10 NOTE — Assessment & Plan Note (Signed)
 Has been sometime since we have done the injection in the site.  Discussed icing regimen and home exercises.  Discussed which activities to do and which ones to avoid.  Increase activity slowly.  Discussed icing regimen.  Follow-up again 6 to 8 weeks or when patient comes back into the country.  Discussed bracing at night.  If continuing and worsening symptoms would be to consider the possibility of which activities to do.

## 2024-02-10 NOTE — Assessment & Plan Note (Signed)
 Multifactorial.  Discussed which activities to do and which ones to avoid.  Increase activity slowly.  Discussed icing regimen.  Patient will be traveling follow-up again in 6 to 8 weeks otherwise.

## 2024-02-10 NOTE — Patient Instructions (Addendum)
 Injection in R wrist today Good to see you! Refilled Vit D See you again in 3 months

## 2024-05-02 ENCOUNTER — Other Ambulatory Visit: Payer: Self-pay | Admitting: Family Medicine

## 2024-05-12 ENCOUNTER — Ambulatory Visit: Payer: Self-pay

## 2024-05-12 ENCOUNTER — Ambulatory Visit (INDEPENDENT_AMBULATORY_CARE_PROVIDER_SITE_OTHER): Admitting: Sports Medicine

## 2024-05-12 VITALS — HR 90 | Ht 64.0 in | Wt 134.0 lb

## 2024-05-12 DIAGNOSIS — G5603 Carpal tunnel syndrome, bilateral upper limbs: Secondary | ICD-10-CM

## 2024-05-12 DIAGNOSIS — M25532 Pain in left wrist: Secondary | ICD-10-CM

## 2024-05-12 DIAGNOSIS — M25531 Pain in right wrist: Secondary | ICD-10-CM | POA: Diagnosis not present

## 2024-05-12 NOTE — Patient Instructions (Signed)
 Ortho referral  As needed follow up

## 2024-05-12 NOTE — Progress Notes (Signed)
 Ben Aubreigh Fuerte D.[facility] [address] [place] [address] Phone: [phone]   Assessment and Plan:     1. Bilateral carpal tunnel syndrome 2. Bilateral wrist pain -Chronic with exacerbation, subsequent visit - Recurrence of bilateral hand and wrist pain with radicular symptoms most consistent with carpal tunnel - Patient has had significant relief in the past with carpal tunnel CSI, however they they are becoming less and less effective and lasting for short periods of time.  Most recent injection 3 months ago only provided <2 months relief.  Patient elects for repeat bilateral carpal tunnel CSI today and tolerated well per note below. - If carpal tunnel CSI are not sufficient in controlling symptoms, we could prescribe another course of prednisone 40 mg daily for 10 days which is beneficial for patient in the past - Recommend using night braces and ergonomic changes to decrease strain  Procedure: Ultrasound Guided Carpal Tunnel Injection Side: Bilateral Diagnosis: Carpal tunnel US  Indication:  - accuracy is paramount for diagnosis - to ensure therapeutic efficacy or procedural success - to reduce procedural risk  After explaining the procedure, viable alternatives, risks, and answering any questions, consent was given verbally. The site was cleaned with chlorhexidine prep. An ultrasound transducer was placed on the volar aspect of the wrist.  The median nerve was identified.   An injection was performed under ultrasound guidance from the ulnar approach with care used to avoid ulnar artery.  1ml of 1% lidocaine without epinephrine was used to hydrodissect the median nerve from the retinaculum.  40 mg of triamcinolone (KENALOG) 40mg /ml was then deposited adjacent to but not into the median nerve.  This was well tolerated and resulted in  relief.  Needle was removed and dressing placed and post injection instructions were given including  a  discussion of likely return of pain today after the anesthetic wears off (with the possibility of worsened pain) until the steroid starts to work in 1-3 days.  Procedure was repeated on contralateral side.  Pt was advised to call or return to clinic if these symptoms worsen or fail to improve as anticipated. Images permanently stored.    Pertinent previous records reviewed include none  Follow Up: As needed   Subjective:   I, Amanda [name], am serving as a Neurosurgeon for Doctor Morene Mace  Chief Complaint: bilat wrist   HPI:   02/10/2024 Amanda [name] is a 53 y.o. female coming in with complaint of back and neck pain. OMT on 12/30/2023. Also looking at R wrist. Patient states needs new Vit D prescription.   Medications patient has been prescribed: Vit D  05/12/24 Patient states bilat CSI wrist . Pain came back last night    Relevant Historical Information: None pertinent  Additional pertinent review of systems negative.   Current Outpatient Medications:    amphetamine-dextroamphetamine (ADDERALL XR) 10 MG 24 hr capsule, Take 1 capsule (10 mg total) by mouth daily., Disp: 30 capsule, Rfl: 0   FLUoxetine (PROZAC) 40 MG capsule, Take 40 mg by mouth daily., Disp: , Rfl:    hydrOXYzine (ATARAX) 10 MG tablet, TAKE 1 TABLET BY MOUTH THREE TIMES A DAY AS NEEDED, Disp: 270 tablet, Rfl: 1   hyoscyamine (LEVBID) 0.375 MG 12 hr tablet, TAKE 1 TABLET BY MOUTH TWICE A DAY, Disp: 180 tablet, Rfl: 0   ondansetron (ZOFRAN) 4 MG tablet, Take 1 tablet (4 mg total) by mouth every 6 (six) hours as needed for nausea or vomiting., Disp:  12 tablet, Rfl: 0   predniSONE (DELTASONE) 20 MG tablet, Take 2 tablets (40 mg total) by mouth daily with breakfast., Disp: 20 tablet, Rfl: 0   UNABLE TO FIND, CBD Gummies at bedtime, Disp: , Rfl:    Vitamin D, Ergocalciferol, (DRISDOL) 1.25 MG (50000 UNIT) CAPS capsule, TAKE 1 CAPSULE BY MOUTH EVERY 7 DAYS, Disp: 12 capsule, Rfl: 0   Objective:     Vitals:    05/12/24 1402  Pulse: 90  SpO2: 98%  Weight: 134 lb (60.8 kg)  Height: 5' 4 (1.626 m)      Body mass index is 23 kg/m.    Physical Exam:    General: Appears well, nad, nontoxic and pleasant Neuro:sensation intact, strength is 5/5 in upper extremities, muscle tone wnl Skin:no susupicious lesions or rashes  Bilateral hand/Wrist:   No deformity or swelling appreciated. ROM  Ext 90, flexion 70, radial/ulnar deviation 30 TTP palmar wrist nttp over the snuff box, dorsal carpals, volar carpals, radial styloid, ulnar styloid, 1st mcp, tfcc Positive Tinel's, Phalen's, Prayer Tests Negative finklestein Neg tfcc bounce test No pain with resisted ext, flex or deviation    Electronically signed by:  Odis Mace D.[facility] 2:18 PM 05/12/24

## 2024-05-31 NOTE — Progress Notes (Unsigned)
 Amanda [name] [address] [place] [address] Phone: [phone] Subjective:   Amanda [name], am serving as a scribe for Dr. Arthea Claudene.  I'm seeing this patient by the request  of:  Teresa Channel, MD  CC: back and neck pain follow up   YEP:Amanda [name]  Amanda [name] is a 53 y.o. female coming in with complaint of back and neck pain. OMT on 02/10/2024. Also seen for wrist pain. Patient states doing okay. Wants to talk medications.         Reviewed prior external information including notes and imaging from previsou exam, outside providers and external EMR if available.   As well as notes that were available from care everywhere and other healthcare systems.  Past medical history, social, surgical and family history all reviewed in electronic medical record.  No pertanent information unless stated regarding to the chief complaint.   Past Medical History:  Diagnosis Date   Anxiety    Breast mass 04/2012   left breast   Complication of anesthesia    states was hard to wake up post-op   Depression    Dry cough    since vocal cord surgery   Sleep related teeth grinding    wears mouth guard at night    Allergies  Allergen Reactions   Chloraprep One Step [Chlorhexidine Gluconate]    Oxycodone  Rash    Rash all over    Lamotrigine     Sulfamethoxazole  Nausea And Vomiting   Vitamin E      Review of Systems:  No headache, visual changes, nausea, vomiting, diarrhea, constipation, dizziness, abdominal pain, skin rash, fevers, chills, night sweats, weight loss, swollen lymph nodes, body aches, joint swelling, chest pain, shortness of breath, mood changes. POSITIVE muscle aches  Objective  Blood pressure 104/70, pulse 96, height 5' 4 (1.626 m), weight 129 lb (58.5 kg), SpO2 98%.   General: No apparent distress alert and oriented x3 mood and affect normal, dressed appropriately.  HEENT: Pupils equal, extraocular movements intact   Respiratory: Patient's speak in full sentences and does not appear short of breath  Cardiovascular: No lower extremity edema, non tender, no erythema  Gait MSK:  Back   Osteopathic findings  C2 flexed rotated and side bent right C7 flexed rotated and side bent left T3 extended rotated and side bent right inhaled rib T7 extended rotated and side bent left L2 flexed rotated and side bent right L5 F RS right  Sacrum right on right       Assessment and Plan:  Left carpal tunnel syndrome Has had bilateral carpal tunnel but seems to be worse on the left than the right at the moment.  Referred to orthopedic surgery to discuss surgical intervention at this time.  Affecting all daily activities.  Right carpal tunnel syndrome Has had multiple injections.  Has responded to them in the past.  Hopefully patient though states that they are no longer seem to be working as much.  Do feel that actually needs to consider surgical intervention at this time.  Low back pain Low back does have loss lordosis  Discussed HEP  Continue on the core strengthening, will try pelvic floor therapy  RTC in 8 weeks     Nonallopathic problems  Decision today to treat with OMT was based on Physical Exam  After verbal consent patient was treated with HVLA, ME, FPR techniques in cervical, rib, thoracic, lumbar, and sacral  areas  Patient tolerated the procedure  well with improvement in symptoms  Patient given exercises, stretches and lifestyle modifications  See medications in patient instructions if given  Patient will follow up in 4-8 weeks    The above documentation has been reviewed and is accurate and complete Amanda Mells M Terriyah Westra, DO          Note: This dictation was prepared with Dragon dictation along with smaller phrase technology. Any transcriptional errors that result from this process are unintentional.

## 2024-06-01 ENCOUNTER — Encounter: Payer: Self-pay | Admitting: Family Medicine

## 2024-06-01 ENCOUNTER — Other Ambulatory Visit: Payer: Self-pay

## 2024-06-01 ENCOUNTER — Ambulatory Visit: Admitting: Family Medicine

## 2024-06-01 VITALS — BP 104/70 | HR 96 | Ht 64.0 in | Wt 129.0 lb

## 2024-06-01 DIAGNOSIS — M6289 Other specified disorders of muscle: Secondary | ICD-10-CM | POA: Diagnosis not present

## 2024-06-01 DIAGNOSIS — M9904 Segmental and somatic dysfunction of sacral region: Secondary | ICD-10-CM

## 2024-06-01 DIAGNOSIS — G5601 Carpal tunnel syndrome, right upper limb: Secondary | ICD-10-CM

## 2024-06-01 DIAGNOSIS — M545 Low back pain, unspecified: Secondary | ICD-10-CM

## 2024-06-01 DIAGNOSIS — G5603 Carpal tunnel syndrome, bilateral upper limbs: Secondary | ICD-10-CM

## 2024-06-01 DIAGNOSIS — M9903 Segmental and somatic dysfunction of lumbar region: Secondary | ICD-10-CM

## 2024-06-01 DIAGNOSIS — G5602 Carpal tunnel syndrome, left upper limb: Secondary | ICD-10-CM

## 2024-06-01 DIAGNOSIS — M9901 Segmental and somatic dysfunction of cervical region: Secondary | ICD-10-CM | POA: Diagnosis not present

## 2024-06-01 DIAGNOSIS — G8929 Other chronic pain: Secondary | ICD-10-CM

## 2024-06-01 DIAGNOSIS — M9908 Segmental and somatic dysfunction of rib cage: Secondary | ICD-10-CM

## 2024-06-01 DIAGNOSIS — M9902 Segmental and somatic dysfunction of thoracic region: Secondary | ICD-10-CM

## 2024-06-01 NOTE — Assessment & Plan Note (Signed)
 Low back does have loss lordosis  Discussed HEP  Continue on the core strengthening, will try pelvic floor therapy  RTC in 8 weeks

## 2024-06-01 NOTE — Patient Instructions (Signed)
 [facility] will call you Dr. Jerri  See me in 2 months

## 2024-06-01 NOTE — Assessment & Plan Note (Signed)
 Has had bilateral carpal tunnel but seems to be worse on the left than the right at the moment.  Referred to orthopedic surgery to discuss surgical intervention at this time.  Affecting all daily activities.

## 2024-06-01 NOTE — Assessment & Plan Note (Signed)
 Has had multiple injections.  Has responded to them in the past.  Hopefully patient though states that they are no longer seem to be working as much.  Do feel that actually needs to consider surgical intervention at this time.

## 2024-06-09 ENCOUNTER — Other Ambulatory Visit: Payer: Self-pay | Admitting: Medical Genetics

## 2024-06-22 ENCOUNTER — Other Ambulatory Visit

## 2024-06-27 ENCOUNTER — Other Ambulatory Visit: Payer: Self-pay | Admitting: Medical Genetics

## 2024-06-27 DIAGNOSIS — Z006 Encounter for examination for normal comparison and control in clinical research program: Secondary | ICD-10-CM

## 2024-07-15 LAB — GENECONNECT MOLECULAR SCREEN: Genetic Analysis Overall Interpretation: NEGATIVE

## 2024-07-18 ENCOUNTER — Ambulatory Visit: Admitting: Orthopaedic Surgery

## 2024-07-18 DIAGNOSIS — G5602 Carpal tunnel syndrome, left upper limb: Secondary | ICD-10-CM | POA: Diagnosis not present

## 2024-07-18 DIAGNOSIS — G5601 Carpal tunnel syndrome, right upper limb: Secondary | ICD-10-CM | POA: Diagnosis not present

## 2024-07-18 NOTE — Progress Notes (Signed)
 Office Visit Note   Patient: Amanda [name]           Date of Birth: 1971-04-29           MRN: [id number] Visit Date: 07/18/2024              Requested by: Claudene Arthea HERO, DO [address] [place],  [place] [address] PCP: Teresa Channel, MD   Assessment & Plan: Visit Diagnoses:  1. Right carpal tunnel syndrome   2. Left carpal tunnel syndrome     Plan: History of Present Illness Amanda [name] is a 53 year old female with bilateral carpal tunnel syndrome who presents with worsening symptoms.  Carpal tunnel syndrome was confirmed by EMG studies three years ago. She has been receiving steroid injections for approximately six years. Initially, these injections provided complete relief, but their effectiveness has diminished over time.  Current symptoms include constant pain affecting all fingers and radiating up the forearm, with the right hand being significantly worse than the left. The pain is present both day and night, significantly disrupting her sleep.  Her last set of injections was in August, but they no longer provide complete relief. Symptoms have impacted her ability to perform daily activities and her work as a Mudlogger at [employer].  Physical Exam MUSCULOSKELETAL: Slight thenar muscle atrophy in both hands.  Positive carpal tunnel compressive signs bilaterally.  Results DIAGNOSTIC Nerve conduction study: Consistent with carpal tunnel syndrome  Assessment and Plan Bilateral carpal tunnel syndrome right worse than left Chronic condition with worsening symptoms confirmed by EMG. Diminishing relief from steroid injections indicates progression. Possible thenar atrophy suggests severe nerve compression. - Schedule right carpal tunnel release surgery during winter break. - Advise against further steroid injections due to diminishing efficacy and increased infection risk. - Provide surgery scheduler's contact information. - Detailed surgical plan  discussed.  Follow-Up Instructions: No follow-ups on file.   Orders:  No orders of the defined types were placed in this encounter.  No orders of the defined types were placed in this encounter.     Procedures: No procedures performed   Clinical Data: No additional findings.   Subjective: Chief Complaint  Patient presents with   Right Wrist - Pain    CTS   Left Wrist - Pain    CTS    HPI  Review of Systems  Constitutional: Negative.   HENT: Negative.    Eyes: Negative.   Respiratory: Negative.    Cardiovascular: Negative.   Endocrine: Negative.   Musculoskeletal: Negative.   Neurological: Negative.   Hematological: Negative.   Psychiatric/Behavioral: Negative.    All other systems reviewed and are negative.    Objective: Vital Signs: There were no vitals taken for this visit.  Physical Exam Vitals and nursing note reviewed.  Constitutional:      Appearance: She is well-developed.  HENT:     Head: Atraumatic.     Nose: Nose normal.  Eyes:     Extraocular Movements: Extraocular movements intact.  Cardiovascular:     Pulses: Normal pulses.  Pulmonary:     Effort: Pulmonary effort is normal.  Abdominal:     Palpations: Abdomen is soft.  Musculoskeletal:     Cervical back: Neck supple.  Skin:    General: Skin is warm.     Capillary Refill: Capillary refill takes less than 2 seconds.  Neurological:     Mental Status: She is alert. Mental status is at baseline.  Psychiatric:  Behavior: Behavior normal.        Thought Content: Thought content normal.        Judgment: Judgment normal.     Ortho Exam  Specialty Comments:  No specialty comments available.  Imaging: No results found.   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   Left carpal tunnel syndrome 05/29/2021   Neck muscle spasm  01/03/2021   Nonallopathic lesion of cervical region 01/03/2021   Right carpal tunnel syndrome 10/11/2018   Generalized anxiety disorder 07/12/2018   Morton's neuroma of left foot 01/16/2016   Low back pain 12/26/2015   Nonallopathic lesion of lumbosacral region 12/26/2015   Nonallopathic lesion of sacral region 12/26/2015   Nonallopathic lesion of thoracic region 12/26/2015   Past Medical History:  Diagnosis Date   Anxiety    Breast mass 04/2012   left breast   Complication of anesthesia    states was hard to wake up post-op   Depression    Dry cough    since vocal cord surgery   Sleep related teeth grinding    wears mouth guard at night    Family History  Problem Relation Age of Onset   Breast cancer Mother 52   Depression Father    Brain cancer Maternal Uncle    Leukemia Maternal Grandmother    Diabetes Maternal Grandfather    Depression Paternal Grandfather    ADD / ADHD Son    Depression Nephew     Past Surgical History:  Procedure Laterality Date   AUGMENTATION MAMMAPLASTY Bilateral 2014   BREAST MASS EXCISION Left 05/26/2012   CERVIX SURGERY  2005   papilloma   UMBILICAL HERNIA REPAIR  as a child   UHR   vocal cord polyp excision  1999   Social History   Occupational History   Occupation: Spanish Professor  Tobacco Use   Smoking status: Every Day    Types: Cigarettes   Smokeless tobacco: Never   Tobacco comments:    smokes 3 cig./day  Substance and Sexual Activity   Alcohol use: Yes    Alcohol/week: 0.0 standard drinks of alcohol    Comment: occasional wine   Drug use: No   Sexual activity: Not on file

## 2024-07-31 ENCOUNTER — Encounter: Payer: Self-pay | Admitting: Radiology

## 2024-07-31 NOTE — Progress Notes (Unsigned)
 [name] [address] [place] [address] Phone: [phone] Subjective:   Amanda [name], am serving as a scribe for Dr. Arthea [name].  I'm seeing this patient by the request  of:  Amanda Channel, MD  CC: Back and neck pain follow-up  YEP:Dlagzrupcz  Amanda [name] is a 53 y.o. female coming in with complaint of back and neck pain. OMT on 06/01/2024. Also seen for wrist pain. Patient states having surgery on Dec 18th. Medication for stomach?  Medications patient has been prescribed:   Taking:         Reviewed prior external information including notes and imaging from previsou exam, outside providers and external EMR if available.   As well as notes that were available from care everywhere and other healthcare systems.  Past medical history, social, surgical and family history all reviewed in electronic medical record.  No pertanent information unless stated regarding to the chief complaint.   Past Medical History:  Diagnosis Date   Anxiety    Breast mass 04/2012   left breast   Complication of anesthesia    states was hard to wake up post-op   Depression    Dry cough    since vocal cord surgery   Sleep related teeth grinding    wears mouth guard at night    Allergies  Allergen Reactions   Chloraprep One Step [Chlorhexidine Gluconate]    Oxycodone  Rash    Rash all over    Lamotrigine     Sulfamethoxazole  Nausea And Vomiting   Vitamin E      Review of Systems:  No headache, visual changes, nausea, vomiting, diarrhea, constipation, dizziness, abdominal pain, skin rash, fevers, chills, night sweats, weight loss, swollen lymph nodes, body aches, joint swelling, chest pain, shortness of breath, mood changes. POSITIVE muscle aches  Objective  Blood pressure 110/66, pulse 82, height 5' 4 (1.626 m), weight 134 lb (60.8 kg), SpO2 98%.   General: No apparent distress alert and oriented x3 mood and affect normal, dressed  appropriately.  HEENT: Pupils equal, extraocular movements intact  Respiratory: Patient's speak in full sentences and does not appear short of breath  Cardiovascular: No lower extremity edema, non tender, no erythema  Gait MSK:  Back does have some loss of lordosis noted.  Some tenderness to palpation in the paraspinal musculature.  Seems to be more over the sacroiliac joints bilaterally.  Does have some weakness in the pelvic floor still noted especially with hip abductors and significant tightness of the hamstring noted.  Osteopathic findings  C2 flexed rotated and side bent right C6 flexed rotated and side bent left T3 extended rotated and side bent right inhaled rib T8 extended rotated and side bent left L2 flexed rotated and side bent right L3 flexed rotated and side bent left Sacrum right on right     Assessment and Plan:  Low back pain Low back pain could be potentially pelvic floor dysfunction as well.  Discussed icing regimen and home exercises, discussed which activities to do and which ones to avoid.  Increase activity slowly.  Discussed icing regimen.  Follow-up again in 6 to 12 weeks    Nonallopathic problems  Decision today to treat with OMT was based on Physical Exam  After verbal consent patient was treated with HVLA, ME, FPR techniques in cervical, rib, thoracic, lumbar, and sacral  areas  Patient tolerated the procedure well with improvement in symptoms  Patient given exercises, stretches and lifestyle modifications  See medications in patient instructions if given  Patient will follow up in 4-8 weeks    The above documentation has been reviewed and is accurate and complete Amanda [name] M Amanda Humphres, DO          Note: This dictation was prepared with Dragon dictation along with smaller phrase technology. Any transcriptional errors that result from this process are unintentional.

## 2024-08-01 ENCOUNTER — Encounter: Payer: Self-pay | Admitting: Family Medicine

## 2024-08-01 ENCOUNTER — Ambulatory Visit: Admitting: Family Medicine

## 2024-08-01 VITALS — BP 110/66 | HR 82 | Ht 64.0 in | Wt 134.0 lb

## 2024-08-01 DIAGNOSIS — G8929 Other chronic pain: Secondary | ICD-10-CM

## 2024-08-01 DIAGNOSIS — M9908 Segmental and somatic dysfunction of rib cage: Secondary | ICD-10-CM | POA: Diagnosis not present

## 2024-08-01 DIAGNOSIS — M9901 Segmental and somatic dysfunction of cervical region: Secondary | ICD-10-CM | POA: Diagnosis not present

## 2024-08-01 DIAGNOSIS — M9903 Segmental and somatic dysfunction of lumbar region: Secondary | ICD-10-CM | POA: Diagnosis not present

## 2024-08-01 DIAGNOSIS — M545 Low back pain, unspecified: Secondary | ICD-10-CM

## 2024-08-01 DIAGNOSIS — M9902 Segmental and somatic dysfunction of thoracic region: Secondary | ICD-10-CM

## 2024-08-01 DIAGNOSIS — M9904 Segmental and somatic dysfunction of sacral region: Secondary | ICD-10-CM | POA: Diagnosis not present

## 2024-08-01 MED ORDER — HYOSCYAMINE SULFATE ER 0.375 MG PO TB12: 0.3750 mg | ORAL_TABLET | Freq: Two times a day (BID) | ORAL | 0 refills | Status: AC

## 2024-08-01 NOTE — Patient Instructions (Signed)
 Good to see you! Write us  about the surgery See you again in 6-8 weeks

## 2024-08-01 NOTE — Assessment & Plan Note (Signed)
 Low back pain could be potentially pelvic floor dysfunction as well.  Discussed icing regimen and home exercises, discussed which activities to do and which ones to avoid.  Increase activity slowly.  Discussed icing regimen.  Follow-up again in 6 to 12 weeks

## 2024-08-08 ENCOUNTER — Ambulatory Visit

## 2024-08-08 NOTE — Therapy (Incomplete)
 OUTPATIENT PHYSICAL THERAPY FEMALE PELVIC EVALUATION   Patient Name: Amanda [name] MRN: [id number] DOB:02-Apr-1971, 53 y.o., female Today's Date: 08/08/2024  END OF SESSION:   Past Medical History:  Diagnosis Date   Anxiety    Breast mass 04/2012   left breast   Complication of anesthesia    states was hard to wake up post-op   Depression    Dry cough    since vocal cord surgery   Sleep related teeth grinding    wears mouth guard at night   Past Surgical History:  Procedure Laterality Date   AUGMENTATION MAMMAPLASTY Bilateral 2014   BREAST MASS EXCISION Left 05/26/2012   CERVIX SURGERY  2005   papilloma   UMBILICAL HERNIA REPAIR  as a child   UHR   vocal cord polyp excision  1999   Patient Active Problem List   Diagnosis Date Noted   Cutaneous eruption 01/26/2023   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   Interdigital neuroma of right foot 02/19/2022   Left carpal tunnel syndrome 05/29/2021   Neck muscle spasm 01/03/2021   Nonallopathic lesion of cervical region 01/03/2021   Right carpal tunnel syndrome 10/11/2018   Generalized anxiety disorder 07/12/2018   Morton's neuroma of left foot 01/16/2016   Low back pain 12/26/2015   Nonallopathic lesion of lumbosacral region 12/26/2015   Nonallopathic lesion of sacral region 12/26/2015   Nonallopathic lesion of thoracic region 12/26/2015    PCP: Teresa Channel, MD  REFERRING PROVIDER: Claudene Arthea HERO, DO   REFERRING DIAG: [phone] (ICD-10-CM) - Pelvic floor dysfunction  THERAPY DIAG:  No diagnosis found.  Rationale for Evaluation and Treatment: Rehabilitation  ONSET DATE: ***  SUBJECTIVE:                                                                                                                                                                                           SUBJECTIVE STATEMENT: ***   PAIN:  Are you having pain? {yes/no:20286} NPRS scale: ***/10 Pain location: {pelvic pain  location:27098}  Pain type: {type:313116} Pain description: {PAIN DESCRIPTION:21022940}   Aggravating factors: *** Relieving factors: ***  PRECAUTIONS: None  RED FLAGS: None   WEIGHT BEARING RESTRICTIONS: No  FALLS:  Has patient fallen in last 6 months? No  OCCUPATION: ***  ACTIVITY LEVEL : ***  PLOF: {PLOF:24004}  PATIENT GOALS: ***  PERTINENT HISTORY:  *** Sexual abuse: {Yes/No:304960894}  BOWEL MOVEMENT: Pain with bowel movement: {yes/no:20286} Type of bowel movement:{PT BM type:27100} Fully empty rectum: {No/Yes:304960894} Leakage: {Yes/No:304960894} Urgency: {Yes/No:304960894} Pads: {Yes/No:304960894} Fiber supplement/laxative {YES/NO AS:20300}  URINATION: Pain with urination: {yes/no:20286} Fully empty bladder: {Yes/No:304960894} Stream: {PT urination:27102} Urgency: {  YES/NO AS:20300} Frequency: *** Nocturia: *** Fluid Intake: *** Leakage: {PT leakage:27103} Pads: {Yes/No:304960894}  INTERCOURSE:  Ability to have vaginal penetration {YES/NO:21197} Pain with intercourse: {pain with intercourse PA:27099} Dryness{YES/NO AS:20300} Climax: *** Marinoff Scale: ***/3 Lubricant: ***  PREGNANCY: Vaginal deliveries *** Tearing {Yes***/No:304960894} Episiotomy {YES/NO AS:20300} C-section deliveries *** Currently pregnant {Yes***/No:304960894}  PROLAPSE: {PT prolapse:27101}   OBJECTIVE:  Note: Objective measures were completed at Evaluation unless otherwise noted.  *** PATIENT SURVEYS:   PFIQ-7: ***  COGNITION: Overall cognitive status: Within functional limits for tasks assessed     SENSATION: Light touch: Appears intact   FUNCTIONAL TESTS:  Squat: *** Single leg stance:  Rt: ***  Lt: *** Curl-up test: *** Sit-up test: *** Active straight leg raise: ***   GAIT: Assistive device utilized: {Assistive devices:23999} Comments: ***  POSTURE: {posture:25561}   LUMBARAROM/PROM:  A/PROM A/PROM  Eval (% available)  Flexion    Extension   Right lateral flexion   Left lateral flexion   Right rotation   Left rotation    (Blank rows = not tested)  PALPATION: General: ***  Abdominal: Sternocostal angle: *** Breathing: *** Tenderness: *** Scar tissue: *** Diastasis: ***                External Perineal Exam: ***                             Internal Pelvic Floor: ***  Patient confirms identification and approves PT to assess internal pelvic floor and treatment {yes/no:20286}  PELVIC MMT:   MMT eval  Vaginal   Internal Anal Sphincter   External Anal Sphincter   Puborectalis   (Blank rows = not tested)        TONE: ***  PROLAPSE: ***  TODAY'S TREATMENT:                                                                                                                              DATE:  *** EVAL  Manual:  Neuromuscular re-education:  Exercises:  Therapeutic activities:     PATIENT EDUCATION:  Education details: See above Person educated: Patient Education method: Explanation, Demonstration, Tactile cues, Verbal cues, and Handouts Education comprehension: verbalized understanding  HOME EXERCISE PROGRAM: ***  ASSESSMENT:  CLINICAL IMPRESSION: Patient is a *** y.o. *** who was seen today for physical therapy evaluation and treatment for ***.   OBJECTIVE IMPAIRMENTS: decreased activity tolerance, decreased coordination, decreased endurance, decreased mobility, decreased ROM, decreased strength, increased fascial restrictions, increased muscle spasms, impaired flexibility, impaired tone, improper body mechanics, postural dysfunction, and pain.   ACTIVITY LIMITATIONS: {activitylimitations:27494}  PARTICIPATION LIMITATIONS: {participationrestrictions:25113}  PERSONAL FACTORS: {Personal factors:25162} are also affecting patient's functional outcome.   REHAB POTENTIAL: {rehabpotential:25112}  CLINICAL DECISION MAKING: {clinical decision making:25114}  EVALUATION COMPLEXITY:  {Evaluation complexity:25115}   GOALS: Goals reviewed with patient? {yes/no:20286}  SHORT TERM GOALS: Target date: ***  Pt will be independent with HEP  in order to improve activity tolerance.   Baseline: Goal status: INITIAL  2.  ***  Baseline:  Goal status: INITIAL  3.  ***  Baseline:  Goal status: INITIAL  4.  *** Baseline:  Goal status: INITIAL  5.  *** Baseline:  Goal status: INITIAL  6.  *** Baseline:  Goal status: INITIAL  LONG TERM GOALS: Target date: ***  Pt will be independent with advanced HEP in order to improve activity tolerance.   Baseline:  Goal status: INITIAL  2.  *** Baseline:  Goal status: INITIAL  3.  *** Baseline:  Goal status: INITIAL  4.  *** Baseline:  Goal status: INITIAL  5.  *** Baseline:  Goal status: INITIAL  6.  *** Baseline:  Goal status: INITIAL  PLAN:  PT FREQUENCY: 1-2x/week  PT DURATION: ***   PLANNED INTERVENTIONS: 02835- PT Re-evaluation, 97110-Therapeutic exercises, 97530- Therapeutic activity, 97112- Neuromuscular re-education, 97535- Self Care, 02859- Manual therapy, [id number]- Gait training, [phone]- Aquatic Therapy, H9716- Electrical stimulation (unattended), [id number]- Traction (mechanical), [id number]- Ionotophoresis 4mg /ml Dexamethasone , 79439 (1-2 muscles), 20561 (3+ muscles)- Dry Needling, Patient/Family education, Balance training, Taping, Joint mobilization, Joint manipulation, Spinal manipulation, Spinal mobilization, Scar mobilization, Vestibular training, Cryotherapy, Moist heat, and Biofeedback  PLAN FOR NEXT SESSION: ***  Josette Mares, PT, DPT11/07/2510:16 AM [facility] [address] [place], [place] [address] Phone # [phone] Fax [phone]

## 2024-08-09 ENCOUNTER — Telehealth: Payer: Self-pay | Admitting: Family Medicine

## 2024-08-09 NOTE — Telephone Encounter (Signed)
 Patient called to let Dr Claudene know that she is scheduled for carpel tunnel surgery on 09/14/24 at [facility] on [address].

## 2024-08-17 ENCOUNTER — Encounter: Payer: Self-pay | Admitting: Family Medicine

## 2024-08-18 ENCOUNTER — Other Ambulatory Visit: Payer: Self-pay

## 2024-08-18 DIAGNOSIS — M6289 Other specified disorders of muscle: Secondary | ICD-10-CM

## 2024-08-31 ENCOUNTER — Encounter: Payer: Self-pay | Admitting: Orthopaedic Surgery

## 2024-08-31 NOTE — Telephone Encounter (Signed)
 Amanda [name] or Amanda [name], can one or both of you help this lady with her questions about surgical cost and forms that she submitted.  Thank you.

## 2024-09-01 NOTE — Telephone Encounter (Signed)
 Not for Tammy.

## 2024-09-04 NOTE — Telephone Encounter (Signed)
Note mailed per pt request.

## 2024-09-06 ENCOUNTER — Other Ambulatory Visit: Payer: Self-pay | Admitting: Physician Assistant

## 2024-09-06 MED ORDER — ONDANSETRON HCL 4 MG PO TABS: 4.0000 mg | ORAL_TABLET | Freq: Three times a day (TID) | ORAL | 0 refills | Status: AC | PRN

## 2024-09-06 MED ORDER — HYDROCODONE-ACETAMINOPHEN 5-325 MG PO TABS: 1.0000 | ORAL_TABLET | Freq: Three times a day (TID) | ORAL | 0 refills | Status: AC | PRN

## 2024-09-11 ENCOUNTER — Telehealth: Payer: Self-pay | Admitting: Family Medicine

## 2024-09-11 NOTE — Telephone Encounter (Signed)
 Patient states that she has not received the referral for therapy in the mail. Please advise.

## 2024-09-12 NOTE — Telephone Encounter (Signed)
 Our mail is picked up by the [employer] and goes through [employer], which can take several days. Would not have expected her to receive this as yet, pt states not needed urgently.

## 2024-09-14 DIAGNOSIS — G5601 Carpal tunnel syndrome, right upper limb: Secondary | ICD-10-CM | POA: Diagnosis not present

## 2024-09-19 ENCOUNTER — Encounter: Payer: Self-pay | Admitting: Orthopaedic Surgery

## 2024-09-19 ENCOUNTER — Telehealth: Payer: Self-pay

## 2024-09-19 MED ORDER — GABAPENTIN 100 MG PO CAPS: 100.0000 mg | ORAL_CAPSULE | Freq: Every day | ORAL | 0 refills | Status: AC

## 2024-09-19 MED ORDER — IBUPROFEN 800 MG PO TABS: 800.0000 mg | ORAL_TABLET | Freq: Three times a day (TID) | ORAL | 2 refills | Status: AC | PRN

## 2024-09-19 NOTE — Telephone Encounter (Signed)
 Patient called and she states she is having so much pain. She is taking hydrocodone  for pain and its not helping. States she also has possible rash? States they are little bumps. Will try to send a mychart picture.  S/P- 09/14/2024-  [facility]   CB [phone]

## 2024-09-19 NOTE — Addendum Note (Signed)
 Addended by: JERRI LOISE SHARPER on: 09/19/2024 01:02 PM   Modules accepted: Orders

## 2024-09-19 NOTE — Telephone Encounter (Signed)
 Spoke to patient.  Sent motrin  and gabapentin 

## 2024-09-19 NOTE — Telephone Encounter (Signed)
 I'll send in some motrin  and gabapentin .

## 2024-09-26 ENCOUNTER — Ambulatory Visit: Admitting: Family Medicine

## 2024-09-26 ENCOUNTER — Ambulatory Visit (INDEPENDENT_AMBULATORY_CARE_PROVIDER_SITE_OTHER): Admitting: Physician Assistant

## 2024-09-26 DIAGNOSIS — G5601 Carpal tunnel syndrome, right upper limb: Secondary | ICD-10-CM

## 2024-09-26 DIAGNOSIS — Z9889 Other specified postprocedural states: Secondary | ICD-10-CM

## 2024-09-26 NOTE — Progress Notes (Signed)
 "  Post-Op Visit Note   Patient: Amanda [name]           Date of Birth: 10-26-1970           MRN: [id number] Visit Date: 09/26/2024 PCP: Teresa Channel, MD   Assessment & Plan:  Chief Complaint:  Chief Complaint  Patient presents with   Right Hand - Routine Post Op    [facility]-09/14/2024   Visit Diagnoses:  1. Right carpal tunnel syndrome   2. S/P carpal tunnel release     Plan: Patient is a pleasant 53 year old female who comes in today 2 weeks status post right carpal tunnel release.  She has been doing well.  She takes occasional gabapentin  and hydrocodone  but tries not to take these if at all possible.  She notes occasional paresthesias.  Examination of her right hand reveals a well-healed surgical incision with nylon sutures in place.  No evidence of infection or cellulitis.  Fingers warm well-perfused.  She is neurovascularly intact distally.  Today, sutures were removed and Steri-Strips applied.  No heavy lifting or submerging her hand underwater for 2 weeks.  She may begin nerve gliding exercises.  Follow-up in 2 weeks for recheck.  Call with concerns or questions.  Follow-Up Instructions: Return in about 2 weeks (around 10/10/2024).   Orders:  No orders of the defined types were placed in this encounter.  No orders of the defined types were placed in this encounter.   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   Left carpal tunnel syndrome 05/29/2021   Neck muscle spasm 01/03/2021   Nonallopathic lesion of cervical region 01/03/2021   Right carpal tunnel syndrome 10/11/2018   Generalized anxiety disorder 07/12/2018   Morton's neuroma of left foot 01/16/2016   Low back pain 12/26/2015   Nonallopathic lesion of lumbosacral region 12/26/2015   Nonallopathic lesion of sacral region 12/26/2015    Nonallopathic lesion of thoracic region 12/26/2015   Past Medical History:  Diagnosis Date   Anxiety    Breast mass 04/2012   left breast   Complication of anesthesia    states was hard to wake up post-op   Depression    Dry cough    since vocal cord surgery   Sleep related teeth grinding    wears mouth guard at night    Family History  Problem Relation Age of Onset   Breast cancer Mother 26   Depression Father    Brain cancer Maternal Uncle    Leukemia Maternal Grandmother    Diabetes Maternal Grandfather    Depression Paternal Grandfather    ADD / ADHD Son    Depression Nephew     Past Surgical History:  Procedure Laterality Date   AUGMENTATION MAMMAPLASTY Bilateral 2014   BREAST MASS EXCISION Left 05/26/2012   CERVIX SURGERY  2005   papilloma   UMBILICAL HERNIA REPAIR  as a child   UHR   vocal cord polyp excision  1999   Social History   Occupational History   Occupation: Spanish Professor  Tobacco Use   Smoking status: Every Day    Types: Cigarettes   Smokeless tobacco: Never   Tobacco comments:    smokes 3 cig./day  Substance and Sexual Activity   Alcohol use: Yes    Alcohol/week: 0.0 standard drinks of alcohol  Comment: occasional wine   Drug use: No   Sexual activity: Not on file     "

## 2024-10-08 ENCOUNTER — Other Ambulatory Visit: Payer: Self-pay | Admitting: Family Medicine

## 2024-10-10 ENCOUNTER — Ambulatory Visit: Admitting: Physician Assistant

## 2024-10-10 DIAGNOSIS — G5601 Carpal tunnel syndrome, right upper limb: Secondary | ICD-10-CM

## 2024-10-10 NOTE — Progress Notes (Signed)
" ° °  Post-Op Visit Note   Patient: Amanda [name]           Date of Birth: 1971-02-28           MRN: [id number] Visit Date: 10/10/2024 PCP: Teresa Channel, MD   Assessment & Plan:  Chief Complaint:  Chief Complaint  Patient presents with   Right Hand - Routine Post Op    [facility]-09/14/2024    Visit Diagnoses:  1. Right carpal tunnel syndrome     Plan: Patient is a pleasant 54 year old female who comes in today 4 weeks status post right carpal tunnel release.  She is doing well.  She does note some burning around the incision but nothing more.  She is on her computer 6 to 7 hours a day.  Examination of her right hand reveals a well-healed surgical incision.  Small scab to the distal aspect.  She is neurovascularly intact distally.  Today, I discussed applying Neosporin to the scab 1-2 times daily.  Continue to advance with activity as tolerated.  Follow-up as needed.  Follow-Up Instructions: Return if symptoms worsen or fail to improve.   Orders:  No orders of the defined types were placed in this encounter.  No orders of the defined types were placed in this encounter.   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   Left carpal tunnel syndrome 05/29/2021   Neck muscle spasm 01/03/2021   Nonallopathic lesion of cervical region 01/03/2021   Right carpal tunnel syndrome 10/11/2018   Generalized anxiety disorder 07/12/2018   Morton's neuroma of left foot 01/16/2016   Low back pain 12/26/2015   Nonallopathic lesion of lumbosacral region 12/26/2015   Nonallopathic lesion of sacral region 12/26/2015   Nonallopathic lesion of thoracic region 12/26/2015   Past Medical History:  Diagnosis Date   Anxiety    Breast mass 04/2012   left breast   Complication of anesthesia    states was hard to wake up post-op    Depression    Dry cough    since vocal cord surgery   Sleep related teeth grinding    wears mouth guard at night    Family History  Problem Relation Age of Onset   Breast cancer Mother 37   Depression Father    Brain cancer Maternal Uncle    Leukemia Maternal Grandmother    Diabetes Maternal Grandfather    Depression Paternal Grandfather    ADD / ADHD Son    Depression Nephew     Past Surgical History:  Procedure Laterality Date   AUGMENTATION MAMMAPLASTY Bilateral 2014   BREAST MASS EXCISION Left 05/26/2012   CERVIX SURGERY  2005   papilloma   UMBILICAL HERNIA REPAIR  as a child   UHR   vocal cord polyp excision  1999   Social History   Occupational History   Occupation: Spanish Professor  Tobacco Use   Smoking status: Every Day    Types: Cigarettes   Smokeless tobacco: Never   Tobacco comments:    smokes 3 cig./day  Substance and Sexual Activity   Alcohol use: Yes    Alcohol/week: 0.0 standard drinks of alcohol    Comment: occasional wine   Drug use: No   Sexual activity: Not on file     "

## 2024-10-16 NOTE — Progress Notes (Unsigned)
 "               Odis Mace D.[facility] [address] [place] [address] Phone: [phone]   Assessment and Plan:     1. Left carpal tunnel syndrome (Primary) -Chronic with exacerbation, subsequent visit - Recurrence of left hand pain with radicular symptoms consistent with recurrent carpal tunnel syndrome - Patient had 3 to 4 months relief after carpal tunnel CSI on 05/12/2024 with symptoms gradually returning over the past 1 month - Elected for repeat carpal tunnel CSI today.  Tolerated well per note below. - Recommend continued use of night braces, ergonomic changes to decrease strain - Patient had right carpal tunnel release surgery and plans on left carpal tunnel release surgery in the future, but may wait up to 1 year - If symptoms continue to flare, could consider prednisone  40 mg daily for 10 days which has been beneficial in the past for patient   Procedure: Ultrasound Guided Carpal Tunnel Injection Side: Left Diagnosis: Carpal tunnel syndrome US  Indication:  - accuracy is paramount for diagnosis - to ensure therapeutic efficacy or procedural success - to reduce procedural risk  After explaining the procedure, viable alternatives, risks, and answering any questions, consent was given verbally. The site was cleaned with chlorhexidine prep. An ultrasound transducer was placed on the volar aspect of the wrist.  The median nerve was identified.   An injection was performed under ultrasound guidance with 40 mg of triamcinolone  (KENALOG) 40mg /ml and 1mL 1% lidocaine  injected adjacent to but not into the median nerve.  This was well tolerated and resulted in  relief.  Needle was removed and dressing placed and post injection instructions were given including  a discussion of likely return of pain today after the anesthetic wears off (with the possibility of worsened pain) until the steroid starts to work in 1-3 days.   Pt was advised to call or return to  clinic if these symptoms worsen or fail to improve as anticipated. Images permanently stored.   Pertinent previous records reviewed include none   Follow Up: As needed.  Could consider repeat carpal tunnel CSI   Subjective:   I, Aster Eckrich, am serving as a neurosurgeon for Doctor Morene Mace  Chief Complaint: bilat  wrist pan   HPI:   10/17/2024 Patient is a 54 year old female with bilat wrist pain. Patient states  ready for L wrist injection    Relevant Historical Information: None pertinent  Additional pertinent review of systems negative.  Current Medications[1]   Objective:     Vitals:   10/17/24 1032  BP: 120/78  Weight: 141 lb (64 kg)  Height: 5' 4 (1.626 m)      Body mass index is 24.2 kg/m.    Physical Exam:    General: Appears well, nad, nontoxic and pleasant Neuro:sensation intact, strength is 5/5 in upper extremities, muscle tone wnl Skin:no susupicious lesions or rashes   Right hand/Wrist:   No deformity or swelling appreciated. ROM  Ext 90, flexion 70, radial/ulnar deviation 30 TTP palmar wrist nttp over the snuff box, dorsal carpals, volar carpals, radial styloid, ulnar styloid, 1st mcp, tfcc Positive Tinel's, Phalen's, Prayer Tests Negative finklestein Neg tfcc bounce test No pain with resisted ext, flex or deviation    Electronically signed by:  Odis Mace D.[facility] 11:51 AM 10/17/24     [1]  Current Outpatient Medications:    HYDROcodone -acetaminophen  (NORCO/VICODIN) 5-325 MG tablet, Take 1  tablet by mouth 3 (three) times daily as needed. To be taken after surgery, Disp: 15 tablet, Rfl: 0   ondansetron  (ZOFRAN ) 4 MG tablet, Take 1 tablet (4 mg total) by mouth every 8 (eight) hours as needed., Disp: 40 tablet, Rfl: 0   amphetamine -dextroamphetamine (ADDERALL XR) 10 MG 24 hr capsule, Take 1 capsule (10 mg total) by mouth daily., Disp: 30 capsule, Rfl: 0   FLUoxetine (PROZAC) 40 MG capsule, Take 40 mg by  mouth daily., Disp: , Rfl:    gabapentin  (NEURONTIN ) 100 MG capsule, Take 1-3 capsules (100-300 mg total) by mouth at bedtime., Disp: 30 capsule, Rfl: 0   hydrOXYzine  (ATARAX ) 10 MG tablet, TAKE 1 TABLET BY MOUTH THREE TIMES A DAY AS NEEDED, Disp: 270 tablet, Rfl: 1   hyoscyamine  (LEVBID ) 0.375 MG 12 hr tablet, Take 1 tablet (0.375 mg total) by mouth 2 (two) times daily., Disp: 180 tablet, Rfl: 0   ibuprofen  (ADVIL ) 800 MG tablet, Take 1 tablet (800 mg total) by mouth every 8 (eight) hours as needed., Disp: 30 tablet, Rfl: 2   ondansetron  (ZOFRAN ) 4 MG tablet, Take 1 tablet (4 mg total) by mouth every 6 (six) hours as needed for nausea or vomiting., Disp: 12 tablet, Rfl: 0   predniSONE  (DELTASONE ) 20 MG tablet, Take 2 tablets (40 mg total) by mouth daily with breakfast., Disp: 20 tablet, Rfl: 0   UNABLE TO FIND, CBD Gummies at bedtime, Disp: , Rfl:    Vitamin D , Ergocalciferol , (DRISDOL ) 1.25 MG (50000 UNIT) CAPS capsule, TAKE 1 CAPSULE BY MOUTH EVERY 7 DAYS, Disp: 6 capsule, Rfl: 0  "

## 2024-10-17 ENCOUNTER — Ambulatory Visit: Payer: Self-pay

## 2024-10-17 ENCOUNTER — Ambulatory Visit: Admitting: Sports Medicine

## 2024-10-17 VITALS — BP 120/78 | Ht 64.0 in | Wt 141.0 lb

## 2024-10-17 DIAGNOSIS — G5602 Carpal tunnel syndrome, left upper limb: Secondary | ICD-10-CM

## 2024-10-17 NOTE — Patient Instructions (Signed)
 As needed follow up

## 2024-10-18 ENCOUNTER — Ambulatory Visit: Admitting: Family Medicine

## 2024-11-21 ENCOUNTER — Ambulatory Visit: Admitting: Family Medicine
